# Patient Record
Sex: Female | Born: 1935 | Race: White | Hispanic: No | Smoking: Never smoker
Health system: Southern US, Community
[De-identification: ages and names within clinical notes are randomized; demographics above are authoritative.]

## PROBLEM LIST (undated history)

## (undated) DIAGNOSIS — E119 Type 2 diabetes mellitus without complications: Secondary | ICD-10-CM

## (undated) DIAGNOSIS — K219 Gastro-esophageal reflux disease without esophagitis: Secondary | ICD-10-CM

## (undated) DIAGNOSIS — F411 Generalized anxiety disorder: Secondary | ICD-10-CM

## (undated) DIAGNOSIS — I872 Venous insufficiency (chronic) (peripheral): Secondary | ICD-10-CM

## (undated) DIAGNOSIS — I10 Essential (primary) hypertension: Secondary | ICD-10-CM

## (undated) DIAGNOSIS — E7800 Pure hypercholesterolemia, unspecified: Secondary | ICD-10-CM

## (undated) DIAGNOSIS — I509 Heart failure, unspecified: Secondary | ICD-10-CM

## (undated) DIAGNOSIS — E559 Vitamin D deficiency, unspecified: Secondary | ICD-10-CM

## (undated) DIAGNOSIS — R269 Unspecified abnormalities of gait and mobility: Secondary | ICD-10-CM

## (undated) DIAGNOSIS — M1990 Unspecified osteoarthritis, unspecified site: Secondary | ICD-10-CM

## (undated) DIAGNOSIS — R911 Solitary pulmonary nodule: Secondary | ICD-10-CM

## (undated) DIAGNOSIS — Z87448 Personal history of other diseases of urinary system: Secondary | ICD-10-CM

## (undated) DIAGNOSIS — I4891 Unspecified atrial fibrillation: Secondary | ICD-10-CM

## (undated) DIAGNOSIS — H3530 Unspecified macular degeneration: Secondary | ICD-10-CM

## (undated) HISTORY — DX: Unspecified osteoarthritis, unspecified site: M19.90

## (undated) HISTORY — DX: Unspecified macular degeneration: H35.30

## (undated) HISTORY — DX: Personal history of other diseases of urinary system: Z87.448

## (undated) HISTORY — DX: Vitamin D deficiency, unspecified: E55.9

## (undated) HISTORY — DX: Type 2 diabetes mellitus without complications: E11.9

## (undated) HISTORY — PX: BLADDER SURGERY: SHX569

## (undated) HISTORY — DX: Gastro-esophageal reflux disease without esophagitis: K21.9

## (undated) HISTORY — DX: Venous insufficiency (chronic) (peripheral): I87.2

## (undated) HISTORY — PX: TOTAL ABDOMINAL HYSTERECTOMY: SHX209

## (undated) HISTORY — DX: Unspecified abnormalities of gait and mobility: R26.9

## (undated) HISTORY — DX: Heart failure, unspecified: I50.9

## (undated) HISTORY — DX: Essential (primary) hypertension: I10

## (undated) HISTORY — DX: Solitary pulmonary nodule: R91.1

## (undated) HISTORY — DX: Unspecified atrial fibrillation: I48.91

## (undated) HISTORY — DX: Pure hypercholesterolemia, unspecified: E78.00

## (undated) HISTORY — DX: Generalized anxiety disorder: F41.1

---

## 1988-12-05 HISTORY — PX: TOTAL KNEE ARTHROPLASTY: SHX125

## 1999-01-16 ENCOUNTER — Encounter: Payer: Self-pay | Admitting: Emergency Medicine

## 1999-01-16 ENCOUNTER — Inpatient Hospital Stay (HOSPITAL_COMMUNITY): Admission: EM | Admit: 1999-01-16 | Discharge: 1999-01-18 | Payer: Self-pay | Admitting: Emergency Medicine

## 1999-03-10 ENCOUNTER — Other Ambulatory Visit: Admission: RE | Admit: 1999-03-10 | Discharge: 1999-03-10 | Payer: Self-pay | Admitting: Internal Medicine

## 2004-11-08 ENCOUNTER — Ambulatory Visit: Payer: Self-pay | Admitting: Pulmonary Disease

## 2005-03-25 ENCOUNTER — Ambulatory Visit: Payer: Self-pay | Admitting: Pulmonary Disease

## 2005-03-29 ENCOUNTER — Ambulatory Visit: Payer: Self-pay | Admitting: *Deleted

## 2005-04-04 ENCOUNTER — Ambulatory Visit: Payer: Self-pay | Admitting: Cardiology

## 2005-04-11 ENCOUNTER — Ambulatory Visit: Payer: Self-pay | Admitting: *Deleted

## 2005-04-25 ENCOUNTER — Ambulatory Visit: Payer: Self-pay | Admitting: Internal Medicine

## 2005-05-03 ENCOUNTER — Ambulatory Visit: Payer: Self-pay | Admitting: Pulmonary Disease

## 2005-05-09 ENCOUNTER — Ambulatory Visit: Payer: Self-pay | Admitting: Cardiology

## 2005-05-23 ENCOUNTER — Ambulatory Visit: Payer: Self-pay

## 2005-05-23 ENCOUNTER — Ambulatory Visit: Payer: Self-pay | Admitting: Cardiology

## 2005-06-01 ENCOUNTER — Ambulatory Visit: Payer: Self-pay | Admitting: Cardiology

## 2005-06-09 ENCOUNTER — Ambulatory Visit: Payer: Self-pay | Admitting: Internal Medicine

## 2005-06-17 ENCOUNTER — Ambulatory Visit: Payer: Self-pay | Admitting: Cardiovascular Disease

## 2005-06-20 ENCOUNTER — Ambulatory Visit: Payer: Self-pay | Admitting: Pulmonary Disease

## 2005-07-04 ENCOUNTER — Ambulatory Visit: Admission: RE | Admit: 2005-07-04 | Discharge: 2005-07-04 | Payer: Self-pay | Admitting: Ophthalmology

## 2005-07-29 ENCOUNTER — Ambulatory Visit: Payer: Self-pay | Admitting: Internal Medicine

## 2005-08-13 ENCOUNTER — Emergency Department (HOSPITAL_COMMUNITY): Admission: EM | Admit: 2005-08-13 | Discharge: 2005-08-13 | Payer: Self-pay | Admitting: Emergency Medicine

## 2005-08-18 ENCOUNTER — Inpatient Hospital Stay (HOSPITAL_COMMUNITY): Admission: EM | Admit: 2005-08-18 | Discharge: 2005-08-21 | Payer: Self-pay | Admitting: Emergency Medicine

## 2005-08-19 ENCOUNTER — Ambulatory Visit: Payer: Self-pay | Admitting: Pulmonary Disease

## 2005-09-05 ENCOUNTER — Ambulatory Visit: Payer: Self-pay | Admitting: Pulmonary Disease

## 2005-09-20 ENCOUNTER — Ambulatory Visit: Payer: Self-pay | Admitting: Cardiology

## 2005-09-30 ENCOUNTER — Ambulatory Visit: Payer: Self-pay | Admitting: Internal Medicine

## 2005-10-12 ENCOUNTER — Ambulatory Visit: Payer: Self-pay | Admitting: Cardiology

## 2005-10-17 ENCOUNTER — Ambulatory Visit: Payer: Self-pay | Admitting: Pulmonary Disease

## 2005-10-19 ENCOUNTER — Ambulatory Visit: Payer: Self-pay | Admitting: Cardiology

## 2005-10-26 ENCOUNTER — Ambulatory Visit: Payer: Self-pay | Admitting: Cardiology

## 2005-11-02 ENCOUNTER — Ambulatory Visit: Payer: Self-pay

## 2005-11-16 ENCOUNTER — Ambulatory Visit: Payer: Self-pay | Admitting: Cardiology

## 2005-11-30 ENCOUNTER — Ambulatory Visit: Payer: Self-pay | Admitting: *Deleted

## 2005-12-13 ENCOUNTER — Ambulatory Visit: Payer: Self-pay | Admitting: Cardiology

## 2005-12-13 ENCOUNTER — Ambulatory Visit: Payer: Self-pay | Admitting: Pulmonary Disease

## 2005-12-21 ENCOUNTER — Ambulatory Visit: Payer: Self-pay | Admitting: Cardiology

## 2005-12-28 ENCOUNTER — Ambulatory Visit: Payer: Self-pay | Admitting: Cardiology

## 2006-01-04 ENCOUNTER — Ambulatory Visit (HOSPITAL_COMMUNITY): Admission: RE | Admit: 2006-01-04 | Discharge: 2006-01-04 | Payer: Self-pay | Admitting: Pulmonary Disease

## 2006-01-04 ENCOUNTER — Encounter (INDEPENDENT_AMBULATORY_CARE_PROVIDER_SITE_OTHER): Payer: Self-pay | Admitting: *Deleted

## 2006-01-11 ENCOUNTER — Ambulatory Visit: Payer: Self-pay | Admitting: Cardiology

## 2006-01-18 ENCOUNTER — Ambulatory Visit: Payer: Self-pay | Admitting: Internal Medicine

## 2006-02-01 ENCOUNTER — Ambulatory Visit: Payer: Self-pay | Admitting: Internal Medicine

## 2006-02-14 ENCOUNTER — Ambulatory Visit: Payer: Self-pay | Admitting: Pulmonary Disease

## 2006-02-15 ENCOUNTER — Ambulatory Visit: Payer: Self-pay | Admitting: Cardiology

## 2006-02-24 ENCOUNTER — Ambulatory Visit: Payer: Self-pay | Admitting: Cardiovascular Disease

## 2006-03-13 ENCOUNTER — Ambulatory Visit: Payer: Self-pay | Admitting: Cardiology

## 2006-04-03 ENCOUNTER — Ambulatory Visit: Payer: Self-pay | Admitting: Cardiology

## 2006-04-17 ENCOUNTER — Ambulatory Visit: Payer: Self-pay | Admitting: Cardiology

## 2006-05-04 ENCOUNTER — Ambulatory Visit: Payer: Self-pay | Admitting: Pulmonary Disease

## 2006-05-05 HISTORY — PX: CHOLECYSTECTOMY: SHX55

## 2006-05-30 ENCOUNTER — Inpatient Hospital Stay (HOSPITAL_COMMUNITY): Admission: EM | Admit: 2006-05-30 | Discharge: 2006-06-06 | Payer: Self-pay | Admitting: Emergency Medicine

## 2006-05-30 ENCOUNTER — Ambulatory Visit: Payer: Self-pay | Admitting: Pulmonary Disease

## 2006-06-02 ENCOUNTER — Encounter (INDEPENDENT_AMBULATORY_CARE_PROVIDER_SITE_OTHER): Payer: Self-pay | Admitting: Specialist

## 2006-06-13 ENCOUNTER — Ambulatory Visit: Payer: Self-pay | Admitting: Pulmonary Disease

## 2006-07-25 ENCOUNTER — Ambulatory Visit: Payer: Self-pay | Admitting: Pulmonary Disease

## 2006-08-25 ENCOUNTER — Inpatient Hospital Stay (HOSPITAL_COMMUNITY): Admission: EM | Admit: 2006-08-25 | Discharge: 2006-08-31 | Payer: Self-pay | Admitting: Emergency Medicine

## 2006-08-25 ENCOUNTER — Ambulatory Visit: Payer: Self-pay | Admitting: Pulmonary Disease

## 2006-09-27 ENCOUNTER — Ambulatory Visit: Payer: Self-pay | Admitting: Pulmonary Disease

## 2006-11-08 ENCOUNTER — Ambulatory Visit: Payer: Self-pay | Admitting: Pulmonary Disease

## 2007-02-12 ENCOUNTER — Ambulatory Visit: Payer: Self-pay | Admitting: Pulmonary Disease

## 2007-02-12 LAB — CONVERTED CEMR LAB
BUN: 21 mg/dL (ref 6–23)
Basophils Absolute: 0.1 10*3/uL (ref 0.0–0.1)
Basophils Relative: 1.5 % — ABNORMAL HIGH (ref 0.0–1.0)
Bilirubin, Direct: 0.2 mg/dL (ref 0.0–0.3)
Calcium: 9 mg/dL (ref 8.4–10.5)
Cholesterol: 184 mg/dL (ref 0–200)
Creatinine, Ser: 1.1 mg/dL (ref 0.4–1.2)
Direct LDL: 79.2 mg/dL
Eosinophils Absolute: 0.2 10*3/uL (ref 0.0–0.6)
Glucose, Bld: 149 mg/dL — ABNORMAL HIGH (ref 70–99)
HDL: 36.6 mg/dL — ABNORMAL LOW (ref >39.0)
Lymphocytes Relative: 34.2 % (ref 12.0–46.0)
Monocytes Absolute: 1 10*3/uL — ABNORMAL HIGH (ref 0.2–0.7)
Monocytes Relative: 15.6 % — ABNORMAL HIGH (ref 3.0–11.0)
Neutrophils Relative %: 46.1 % (ref 43.0–77.0)
Platelets: 164 10*3/uL (ref 150–400)
RDW: 12.5 % (ref 11.5–14.6)
WBC: 6.5 10*3/uL (ref 4.5–10.5)

## 2007-05-11 ENCOUNTER — Emergency Department (HOSPITAL_COMMUNITY): Admission: EM | Admit: 2007-05-11 | Discharge: 2007-05-11 | Payer: Self-pay | Admitting: Emergency Medicine

## 2007-06-14 ENCOUNTER — Ambulatory Visit: Payer: Self-pay | Admitting: Pulmonary Disease

## 2007-07-04 IMAGING — CT CT HEAD W/O CM
1 of 2 series · 13 of 30 positions shown, 17 images · IV contrast (agent unspecified)
Comparison: none

CLINICAL DATA: Dizziness, headache.
 CT OF THE HEAD WITHOUT CONTRAST:
TECHNIQUE: 5 mm collimated images were obtained from the base of the skull through the vertex according to standard protocol without contrast.

[Series 2: brain · axial · 0.47mm/px · z∈[+129,+258]mm · 13 of 32 slices shown, 17 images]
[im 3/32  brain]
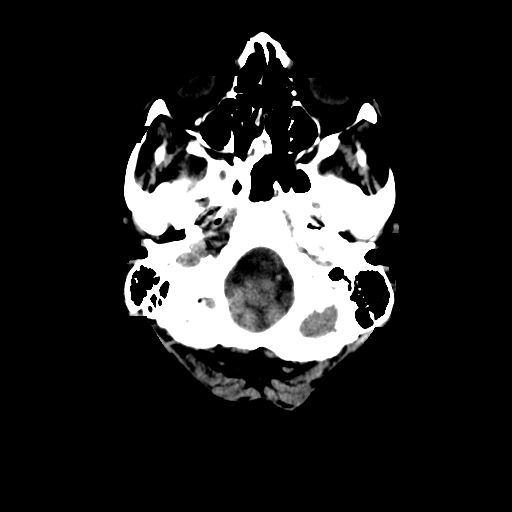
[im 3/32  bone]
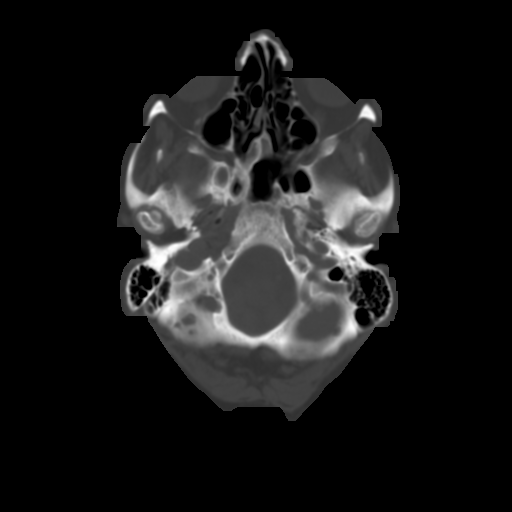
[im 5/32  brain]
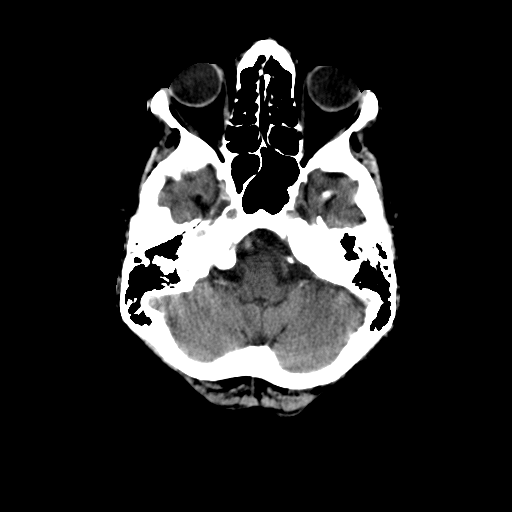
[im 7/32  brain]
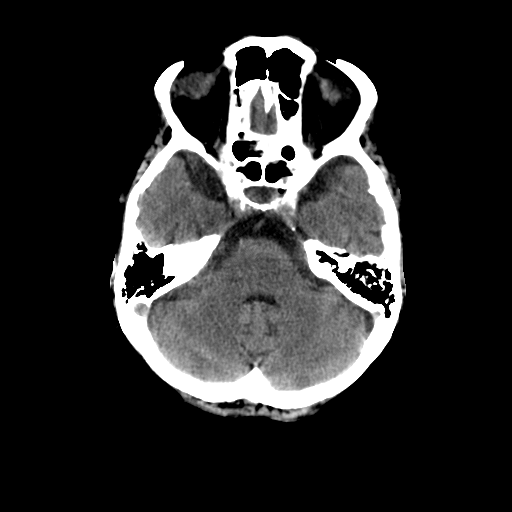
[im 9/32  brain]
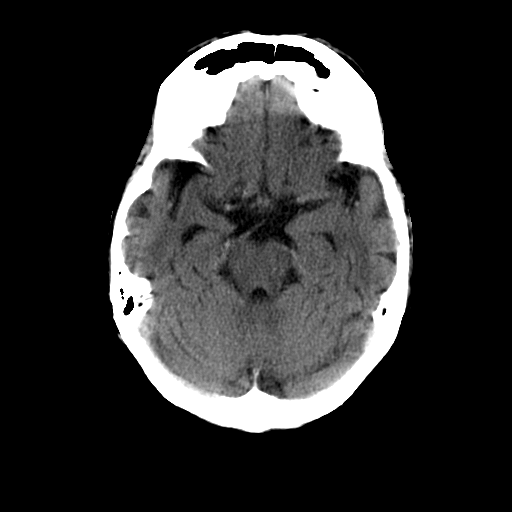
[im 12/32  brain]
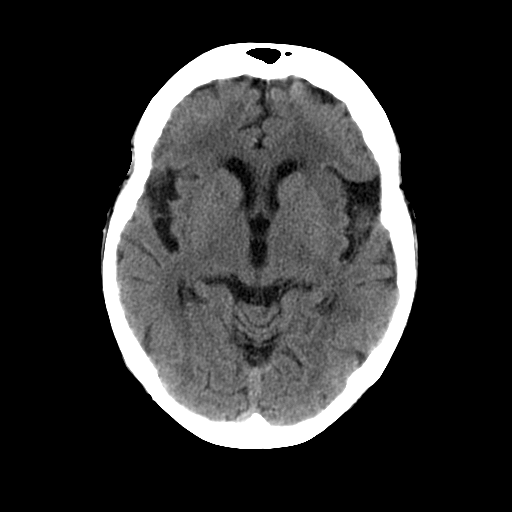
[im 12/32  bone]
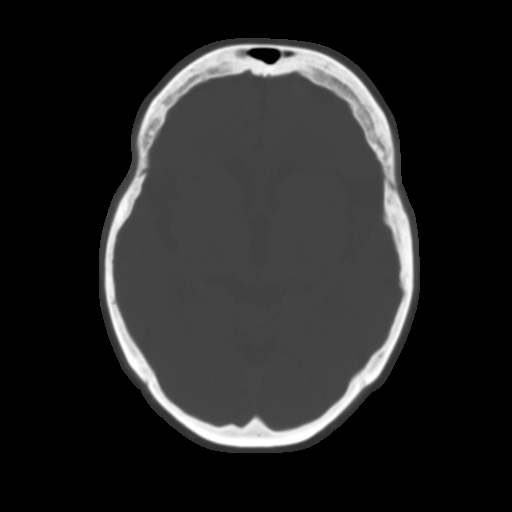
[im 14/32  brain]
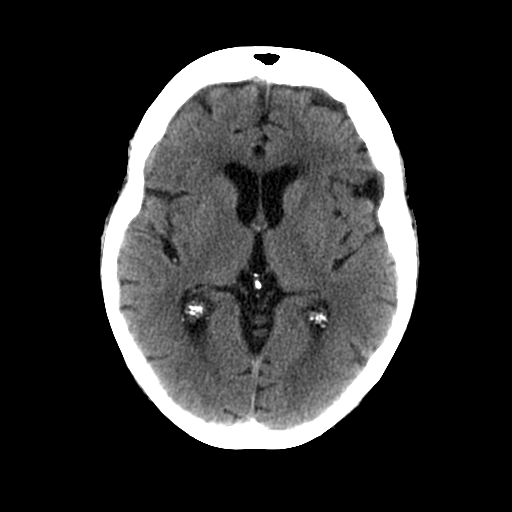
[im 16/32  brain]
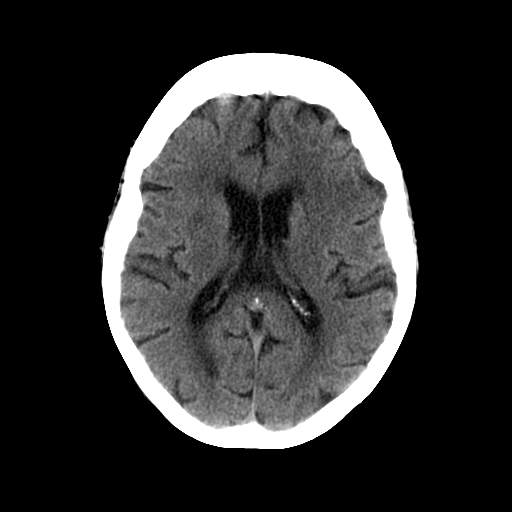
[im 18/32  brain]
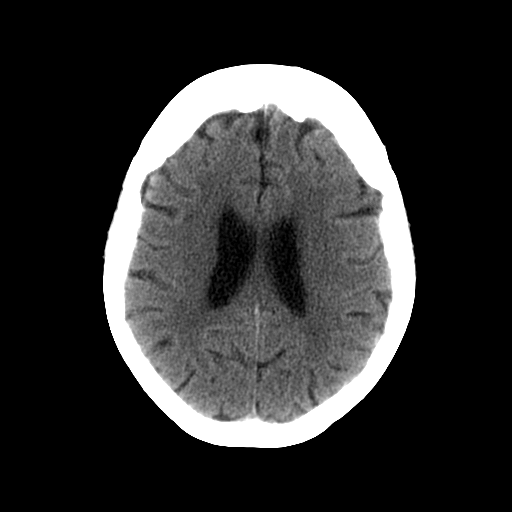
[im 20/32  brain]
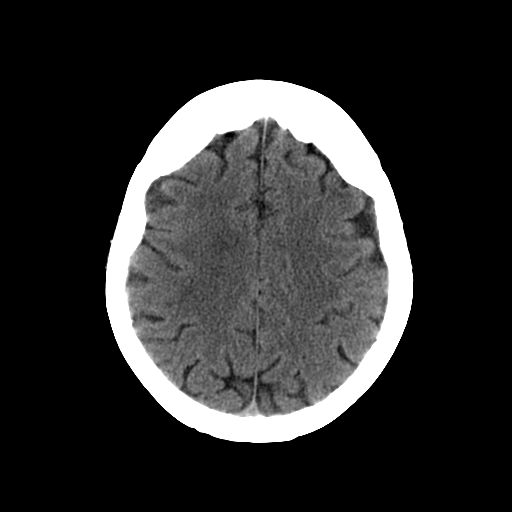
[im 20/32  bone]
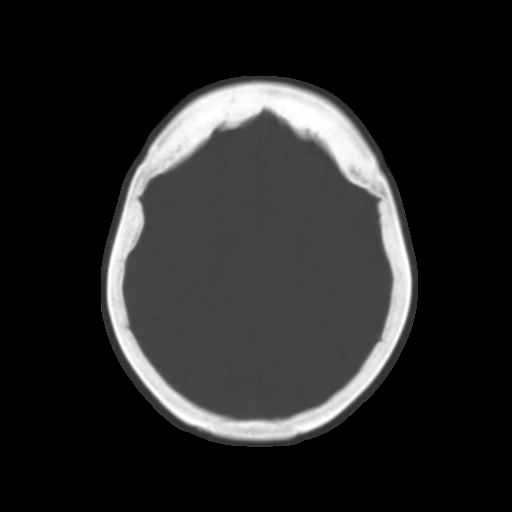
[im 23/32  brain]
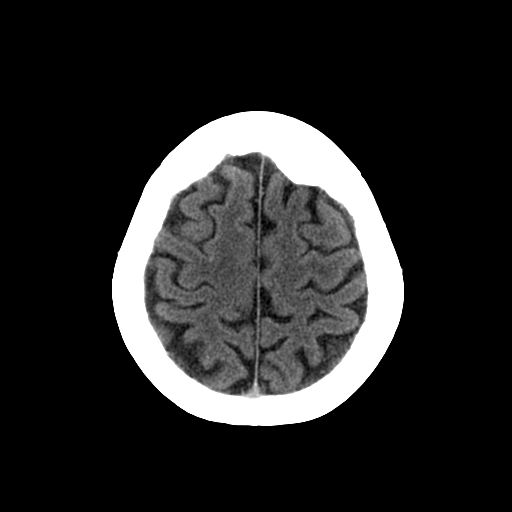
[im 25/32  brain]
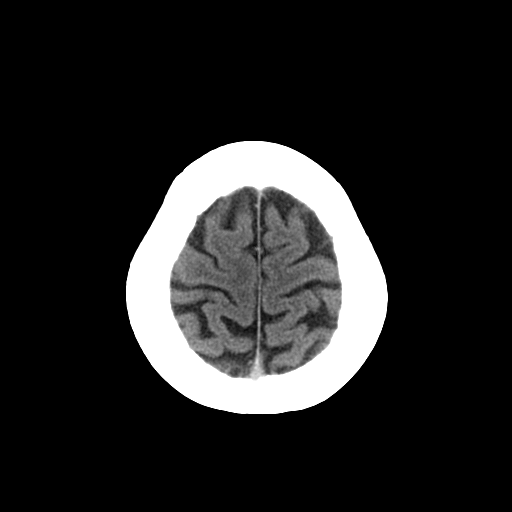
[im 27/32  brain]
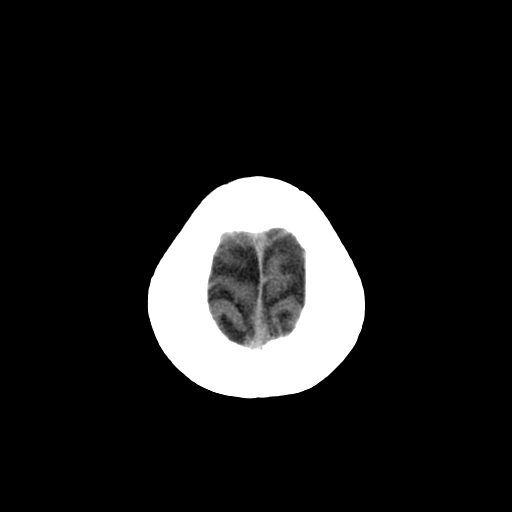
[im 29/32  brain]
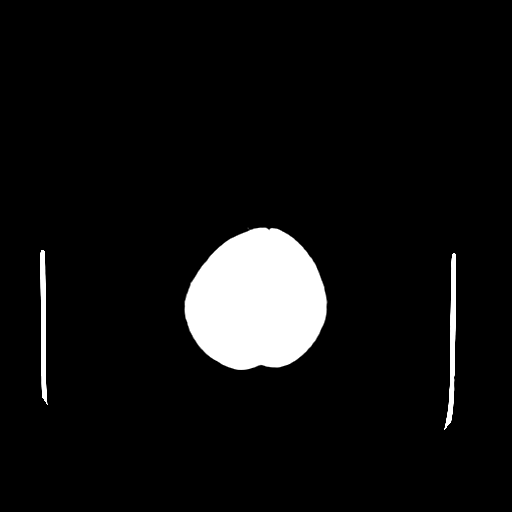
[im 29/32  bone]
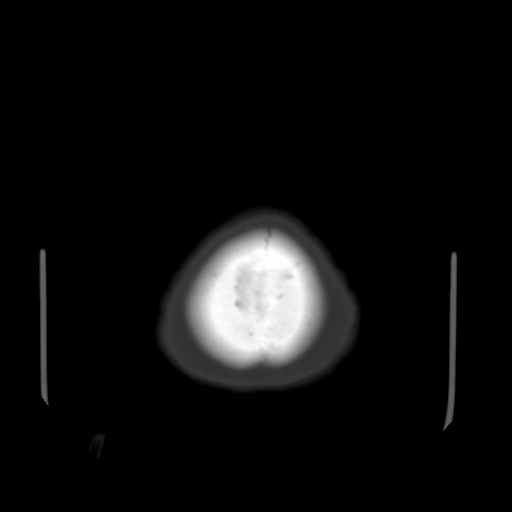

[13 of 30 positions shown; findings below may reference images not displayed]

FINDINGS: No comparison films available.  
 No acute intracranial abnormalities identified including mass or mass effect, hydrocephalus and extra-axial fluid collection, midline shift, hemorrhage, acute infarct.   Acute infarct may be missed by CT for 24-48 hours.  Decreased attenuation in the periventricular white matter is compatible with chronic ischemic changes of a microvascular nature.
IMPRESSION: No evidence of acute intracranial abnormality.

## 2007-10-10 DIAGNOSIS — Z87448 Personal history of other diseases of urinary system: Secondary | ICD-10-CM | POA: Insufficient documentation

## 2007-10-10 DIAGNOSIS — F411 Generalized anxiety disorder: Secondary | ICD-10-CM | POA: Insufficient documentation

## 2007-10-10 DIAGNOSIS — M1990 Unspecified osteoarthritis, unspecified site: Secondary | ICD-10-CM | POA: Insufficient documentation

## 2007-10-10 DIAGNOSIS — E7800 Pure hypercholesterolemia, unspecified: Secondary | ICD-10-CM | POA: Insufficient documentation

## 2007-10-10 DIAGNOSIS — K219 Gastro-esophageal reflux disease without esophagitis: Secondary | ICD-10-CM

## 2007-10-10 DIAGNOSIS — I10 Essential (primary) hypertension: Secondary | ICD-10-CM | POA: Insufficient documentation

## 2007-10-18 ENCOUNTER — Ambulatory Visit: Payer: Self-pay | Admitting: Pulmonary Disease

## 2007-10-18 LAB — CONVERTED CEMR LAB
ALT: 19 units/L (ref 0–35)
AST: 31 units/L (ref 0–37)
BUN: 15 mg/dL (ref 6–23)
CO2: 30 meq/L (ref 19–32)
Calcium: 9.2 mg/dL (ref 8.4–10.5)
Chloride: 99 meq/L (ref 96–112)
Creatinine, Ser: 1.2 mg/dL (ref 0.4–1.2)
Eosinophils Absolute: 0 10*3/uL (ref 0.0–0.6)
GFR calc non Af Amer: 44 mL/min
Glucose, Bld: 182 mg/dL — ABNORMAL HIGH (ref 70–99)
HCT: 43.5 % (ref 36.0–46.0)
Hemoglobin: 15.2 g/dL — ABNORMAL HIGH (ref 12.0–15.0)
LDL Cholesterol: 82 mg/dL (ref 0–99)
Lymphocytes Relative: 21.1 % (ref 12.0–46.0)
MCV: 92.4 fL (ref 78.0–100.0)
Monocytes Absolute: 1.6 10*3/uL — ABNORMAL HIGH (ref 0.2–0.7)
Monocytes Relative: 13.4 % — ABNORMAL HIGH (ref 3.0–11.0)
Neutro Abs: 8.1 10*3/uL — ABNORMAL HIGH (ref 1.4–7.7)
Neutrophils Relative %: 65 % (ref 43.0–77.0)
RBC: 4.71 M/uL (ref 3.87–5.11)
RDW: 12.9 % (ref 11.5–14.6)
TSH: 1.67 microintl units/mL (ref 0.35–5.50)
Total CHOL/HDL Ratio: 4

## 2007-11-23 ENCOUNTER — Emergency Department (HOSPITAL_COMMUNITY): Admission: EM | Admit: 2007-11-23 | Discharge: 2007-11-23 | Payer: Self-pay | Admitting: Family Medicine

## 2007-11-23 ENCOUNTER — Telehealth: Payer: Self-pay | Admitting: Pulmonary Disease

## 2007-12-12 ENCOUNTER — Ambulatory Visit: Payer: Self-pay | Admitting: Pulmonary Disease

## 2008-02-15 ENCOUNTER — Ambulatory Visit: Payer: Self-pay | Admitting: Pulmonary Disease

## 2008-02-15 DIAGNOSIS — I509 Heart failure, unspecified: Secondary | ICD-10-CM | POA: Insufficient documentation

## 2008-02-15 DIAGNOSIS — R269 Unspecified abnormalities of gait and mobility: Secondary | ICD-10-CM

## 2008-02-15 DIAGNOSIS — I4891 Unspecified atrial fibrillation: Secondary | ICD-10-CM

## 2008-02-15 DIAGNOSIS — I872 Venous insufficiency (chronic) (peripheral): Secondary | ICD-10-CM | POA: Insufficient documentation

## 2008-02-15 DIAGNOSIS — H3530 Unspecified macular degeneration: Secondary | ICD-10-CM | POA: Insufficient documentation

## 2008-02-15 DIAGNOSIS — E119 Type 2 diabetes mellitus without complications: Secondary | ICD-10-CM

## 2008-02-15 DIAGNOSIS — J984 Other disorders of lung: Secondary | ICD-10-CM

## 2008-02-16 LAB — CONVERTED CEMR LAB
AST: 26 units/L (ref 0–37)
Albumin: 3.5 g/dL (ref 3.5–5.2)
Alkaline Phosphatase: 91 units/L (ref 39–117)
Calcium: 9.3 mg/dL (ref 8.4–10.5)
Cholesterol: 206 mg/dL (ref 0–200)
Creatinine, Ser: 1.1 mg/dL (ref 0.4–1.2)
Eosinophils Absolute: 0.1 10*3/uL (ref 0.0–0.6)
HDL: 40.1 mg/dL (ref >39.0)
Hemoglobin: 14.3 g/dL (ref 12.0–15.0)
Lymphocytes Relative: 22.6 % (ref 12.0–46.0)
MCHC: 33.7 g/dL (ref 30.0–36.0)
MCV: 97.4 fL (ref 78.0–100.0)
Monocytes Absolute: 0.8 10*3/uL — ABNORMAL HIGH (ref 0.2–0.7)
Monocytes Relative: 11 % (ref 3.0–11.0)
Neutro Abs: 4.5 10*3/uL (ref 1.4–7.7)
Neutrophils Relative %: 64.1 % (ref 43.0–77.0)
Potassium: 3.9 meq/L (ref 3.5–5.1)
RBC: 4.35 M/uL (ref 3.87–5.11)
RDW: 12.5 % (ref 11.5–14.6)
TSH: 1.46 microintl units/mL (ref 0.35–5.50)
Total Bilirubin: 1.3 mg/dL — ABNORMAL HIGH (ref 0.3–1.2)
Total Protein: 7 g/dL (ref 6.0–8.3)
Triglycerides: 332 mg/dL (ref 0–149)
VLDL: 66 mg/dL — ABNORMAL HIGH (ref 0–40)

## 2008-03-25 ENCOUNTER — Telehealth: Payer: Self-pay | Admitting: Pulmonary Disease

## 2008-06-16 ENCOUNTER — Ambulatory Visit: Payer: Self-pay | Admitting: Pulmonary Disease

## 2008-06-16 LAB — CONVERTED CEMR LAB
CO2: 30 meq/L (ref 19–32)
Calcium: 9.3 mg/dL (ref 8.4–10.5)
Chloride: 106 meq/L (ref 96–112)
Creatinine, Ser: 1.1 mg/dL (ref 0.4–1.2)
Glucose, Bld: 138 mg/dL — ABNORMAL HIGH (ref 70–99)
Hgb A1c MFr Bld: 6.7 % — ABNORMAL HIGH (ref 4.6–6.0)
Potassium: 4.3 meq/L (ref 3.5–5.1)
Sodium: 145 meq/L (ref 135–145)

## 2008-07-18 LAB — CONVERTED CEMR LAB: Vit D, 1,25-Dihydroxy: 11 — ABNORMAL LOW (ref 30–89)

## 2008-10-17 ENCOUNTER — Ambulatory Visit: Payer: Self-pay | Admitting: Pulmonary Disease

## 2008-11-20 ENCOUNTER — Ambulatory Visit: Payer: Self-pay | Admitting: Pulmonary Disease

## 2009-02-20 ENCOUNTER — Ambulatory Visit: Payer: Self-pay | Admitting: Pulmonary Disease

## 2009-02-20 DIAGNOSIS — E559 Vitamin D deficiency, unspecified: Secondary | ICD-10-CM | POA: Insufficient documentation

## 2009-02-21 LAB — CONVERTED CEMR LAB
BUN: 20 mg/dL (ref 6–23)
Basophils Relative: 0.9 % (ref 0.0–3.0)
Bilirubin, Direct: 0.2 mg/dL (ref 0.0–0.3)
CO2: 31 meq/L (ref 19–32)
Chloride: 102 meq/L (ref 96–112)
Cholesterol: 172 mg/dL (ref 0–200)
Creatinine, Ser: 1 mg/dL (ref 0.4–1.2)
Direct LDL: 91.5 mg/dL
Eosinophils Absolute: 0.1 10*3/uL (ref 0.0–0.7)
Glucose, Bld: 139 mg/dL — ABNORMAL HIGH (ref 70–99)
Hgb A1c MFr Bld: 6.6 % — ABNORMAL HIGH (ref 4.6–6.5)
MCHC: 35.1 g/dL (ref 30.0–36.0)
MCV: 96.1 fL (ref 78.0–100.0)
Monocytes Absolute: 0.7 10*3/uL (ref 0.1–1.0)
Neutrophils Relative %: 59.2 % (ref 43.0–77.0)
Platelets: 165 10*3/uL (ref 150.0–400.0)
RBC: 4.51 M/uL (ref 3.87–5.11)
RDW: 12.1 % (ref 11.5–14.6)
Total Protein: 7.3 g/dL (ref 6.0–8.3)

## 2009-08-21 ENCOUNTER — Ambulatory Visit: Payer: Self-pay | Admitting: Pulmonary Disease

## 2009-08-22 LAB — CONVERTED CEMR LAB
BUN: 20 mg/dL (ref 6–23)
Creatinine, Ser: 1.2 mg/dL (ref 0.4–1.2)
GFR calc non Af Amer: 44.21 mL/min (ref >60)
Hgb A1c MFr Bld: 6.8 % — ABNORMAL HIGH (ref 4.6–6.5)
Potassium: 4.2 meq/L (ref 3.5–5.1)

## 2010-02-19 ENCOUNTER — Ambulatory Visit: Payer: Self-pay | Admitting: Pulmonary Disease

## 2010-02-20 LAB — CONVERTED CEMR LAB
ALT: 16 units/L (ref 0–35)
AST: 27 units/L (ref 0–37)
Albumin: 3.6 g/dL (ref 3.5–5.2)
BUN: 17 mg/dL (ref 6–23)
CO2: 31 meq/L (ref 19–32)
Chloride: 103 meq/L (ref 96–112)
Direct LDL: 82.9 mg/dL
Eosinophils Relative: 2.4 % (ref 0.0–5.0)
Glucose, Bld: 155 mg/dL — ABNORMAL HIGH (ref 70–99)
HDL: 45.8 mg/dL (ref >39.00)
Monocytes Absolute: 0.8 10*3/uL (ref 0.1–1.0)
Monocytes Relative: 15.1 % — ABNORMAL HIGH (ref 3.0–12.0)
Neutrophils Relative %: 49.6 % (ref 43.0–77.0)
Platelets: 135 10*3/uL — ABNORMAL LOW (ref 150.0–400.0)
Potassium: 4.2 meq/L (ref 3.5–5.1)
Pro B Natriuretic peptide (BNP): 189 pg/mL — ABNORMAL HIGH (ref 0.0–100.0)
TSH: 1.53 microintl units/mL (ref 0.35–5.50)
Total Protein: 7.3 g/dL (ref 6.0–8.3)
WBC: 5.3 10*3/uL (ref 4.5–10.5)

## 2010-02-25 ENCOUNTER — Encounter: Payer: Self-pay | Admitting: Pulmonary Disease

## 2010-08-04 ENCOUNTER — Emergency Department (HOSPITAL_COMMUNITY): Admission: EM | Admit: 2010-08-04 | Discharge: 2010-08-04 | Payer: Self-pay | Admitting: Licensed Clinical Social Worker

## 2010-08-04 ENCOUNTER — Telehealth: Payer: Self-pay | Admitting: Pulmonary Disease

## 2010-08-18 ENCOUNTER — Ambulatory Visit: Payer: Self-pay | Admitting: Pulmonary Disease

## 2010-08-23 LAB — CONVERTED CEMR LAB
Bilirubin Urine: NEGATIVE
Calcium: 9.4 mg/dL (ref 8.4–10.5)
Creatinine, Ser: 1.1 mg/dL (ref 0.4–1.2)
Direct LDL: 72.4 mg/dL
GFR calc non Af Amer: 47.29 mL/min (ref >60)
Glucose, Bld: 166 mg/dL — ABNORMAL HIGH (ref 70–99)
Microalb, Ur: 1.6 mg/dL (ref 0.0–1.9)
Nitrite: NEGATIVE
Sodium: 144 meq/L (ref 135–145)
Specific Gravity, Urine: 1.015 (ref 1.000–1.030)
Total Protein, Urine: NEGATIVE mg/dL
Triglycerides: 212 mg/dL — ABNORMAL HIGH (ref 0.0–149.0)
pH: 7 (ref 5.0–8.0)

## 2010-08-25 ENCOUNTER — Encounter: Payer: Self-pay | Admitting: Pulmonary Disease

## 2010-08-31 ENCOUNTER — Telehealth (INDEPENDENT_AMBULATORY_CARE_PROVIDER_SITE_OTHER): Payer: Self-pay | Admitting: *Deleted

## 2010-09-01 ENCOUNTER — Telehealth (INDEPENDENT_AMBULATORY_CARE_PROVIDER_SITE_OTHER): Payer: Self-pay | Admitting: *Deleted

## 2010-09-07 ENCOUNTER — Telehealth (INDEPENDENT_AMBULATORY_CARE_PROVIDER_SITE_OTHER): Payer: Self-pay | Admitting: *Deleted

## 2010-10-11 ENCOUNTER — Telehealth: Payer: Self-pay | Admitting: Pulmonary Disease

## 2010-10-11 ENCOUNTER — Ambulatory Visit: Payer: Self-pay | Admitting: Pulmonary Disease

## 2010-10-12 ENCOUNTER — Ambulatory Visit: Payer: Self-pay | Admitting: Internal Medicine

## 2010-10-18 ENCOUNTER — Telehealth: Payer: Self-pay | Admitting: Pulmonary Disease

## 2010-11-01 ENCOUNTER — Telehealth (INDEPENDENT_AMBULATORY_CARE_PROVIDER_SITE_OTHER): Payer: Self-pay | Admitting: *Deleted

## 2010-11-09 ENCOUNTER — Encounter: Payer: Self-pay | Admitting: Pulmonary Disease

## 2010-12-17 ENCOUNTER — Encounter: Payer: Self-pay | Admitting: Pulmonary Disease

## 2011-01-04 NOTE — Progress Notes (Signed)
Summary: pt fell at home, at [facility] ER - FYI  Phone Note Call from Patient   Caller: granddaughter sherril drye Call For: Carlea Badour Summary of Call: FYI: pt fell in bathroom this am. - hit her head- is at [facility]- er now having a CT. sherril drye cell [phone]. just wanted dr Kriste Basque to be aware.  Initial call taken by: Tivis Ringer, CNA,  August 04, 2010 11:04 AM  Follow-up for Phone Call        called spoke with Sherril to make her aware that if patient is admitted then SN will not be rounding on pt, triad hospitalist will be.  sherril verbalized her understanding and stated she will relay this info to her mother.  will forward message to SN as FYI. Boone Master CNA/MA  August 04, 2010 11:10 AM   Additional Follow-up for Phone Call Additional follow up Details #1::        SN is aware Randell Loop CMA  August 04, 2010 11:14 AM

## 2011-01-04 NOTE — Progress Notes (Signed)
Summary: order for wheelchair  Phone Note Call from Patient Call back at [phone] or [phone]   Caller: Daughter//wilma grubb Call For: nadel Summary of Call: Request an order for wheelchair for her mom, wants it written up so medicare will pay for it. Initial call taken by: Darletta Moll,  November 01, 2010 2:07 PM  Follow-up for Phone Call        daughter aware order sent to [facility] for wheelchair and i put in the order she wants this to go through [facility]-they supply dme equipment also Follow-up by: Philipp Deputy CMA,  November 01, 2010 3:46 PM

## 2011-01-04 NOTE — Assessment & Plan Note (Signed)
Summary: fall-hit head-dizziness/la   CC:  2 month ROV & add-on for fall....  History of Present Illness: 77 y/o WF here for an add-on visit due to a fall yest... she has mult medical problems as noted below...    ~  August 18, 2010:  she fell 2wks ago- hit occiput w/ hematoma on scalp> ER eval reviewed: lost balance; fell into bathtub; no LOC;  labs- OK, BS=180, +UTI given macrobid;  CT Brain= atrophy, chr microvasc dis, NAD;  CT CSpine= multilevel spondylosis & suboccipital hematoma in soft tissues w/o fx etc...  She had Ophthal f/u 3/11 DrRankin> senile macular degen OU, vitreous detach OS, bilat Drusen...  she received letter from [facility] DM & needs to be on ACE or ARB, therefore start Losartan 25mg /d...   ~  October 11, 2010:  add-on appt today after fall at home yest> she has been sl dizzy in the AMs & fell in the bathroom yest AM striking right occiput on commode- no blackout etc, didn't break skin, no hematoma but area bruised, min tender, neck OK, etc... she notes severe arthritis w/ pain & left knee gives way (right TKR in past)... she had recent infected callous tip of left 3rd hammer toe w/ drainage & antibiotic rx from DrSikora- much improved now (prob neuropathy & they are planning to get DM shoes)... BP appears well controlled on meds & no postural changes apparent, but sl higher in AMs & we discussed poss strategies w/ timing of pills etc... Plan- check CT Brain w/o contrast>    Current Problem List:  MACULAR DEGENERATION (ICD-362.50) - on OCUVITES daily... s/p laser surg by DrRankin... she cannot see well enough to read... they tried Bilberry Juice as heard on the [facility]- it's helping...  ~  3/10: had f/u w/ DrRankin- stabilized... nothing else he can do... f/u 77yr.  ~  3/11:  had f/u DrRankin> senile macular degen OU (worsening), vitreous detach OS (stable), bilat Drusen...    PULMONARY NODULE (ICD-518.89) - RUL nodule, eval 2007 w/ bx = granuloma...   ~  Serial CXRs  have been stable...  ~  CXR 3/11 showed chr changes & calcif granulomas, prob calcif of LAD seen on lat.  HYPERTENSION (ICD-401.9) - she takes ASA 81mg /d, ATENOLOL 25mg /d, LOSARTAN 25mg /d, & LASIX 40mg /d... BP=136/82, no postural changes,  and tol meds well... denies HA, CP, palpit, change in dyspnea, edema, etc...   ~  9/11:  she received letter from [facility] stating she should be on ACE or ARB Rx> add Losartan 25mg /d...  CONGESTIVE HEART FAILURE (ICD-428.0) - on above meds...  ~  2DEcho 6/06 showed biatrial enlargement, mild MR/TR, norm LVF...  ~  labs 3/11 showed BNP= 189...  ATRIAL FIBRILLATION, CHRONIC (ICD-427.31) - prev on Coumadin, pt & family decided to stop coumadin in 2007, & now on ASA daily... rate control strategy w/ Atenolol...  VENOUS INSUFFICIENCY (ICD-459.81) - on low sodium diet, and Lasix... no edema...  HYPERCHOLESTEROLEMIA (ICD-272.0) - on PRAVACHOL 40mg Qhs and tol well...  ~  FLP 11/08 showed TChol 151, TG 152, HDL 38, LDL 82... > continue med & diet efforts.  ~  FLP 3/09 shows TChol 206, TG 332, HDL 40, LDL 97... > cont same, get wt down!  ~  FLP 3/10 showed TChol 172, TG 214, HDL 43, LDL 92... > improved- continue same.  ~  FLP 3/11 showed TChol 167, TG 236, HDL 46, LDL 83  ~  FLP 9/11 showed TChol 144, TG 212, HDL 36,  LDL 72... reminded of low fat diet rec...  DM (ICD-250.00) - on METFORMIN ER 500mg Bid now...  ~  labs 11/08 showed FBS=182, HgA1c=7.1... >continue attempts at diet control.  ~  labs 3/09 showed  FBS=178, HgA1c=7.2... >she will need to start meds: Metformin ER 500mg Qam.  ~  labs 7/09 showed BS= 138, HgA1c= 6.7.Marland KitchenMarland Kitchenrec- keep same.   ~  labs 3/10 (wt=162#) showed 139, HgA1c= 6.6... > continue same meds.  ~  labs 9/10 (wt=175#) showed BS= 158, A1c= 6.8.Marland KitchenMarland Kitchen needs better diet.  ~  labs 3/11 (wt=179#) showed BS= 155, A1c= 7.6.Marland KitchenMarland Kitchen may need incr meds! get wt down.  ~  labs 9/11 (wt=173#) showed BS= 166, A1c= 8.0.Marland KitchenMarland Kitchen worsening A1c- rec incr Metform to 2/d...  ~   Urine microalb 9/11 = neg...  GERD (ICD-530.81) - on PRILOSEC 20mg /d, doing well without heartburn, n/v, etc... Hx HH, GERD, and prev DU... last EGD was 7/02 showing HH, reflux, stricture, gastric ulcer... she has refused colonoscopies in the past...  UTI'S, HX OF (ICD-V13.00) - hx UTI's, s/p hysterectomy, prev bladder surg...  ~  8/11:  ER visit after fall w/ UTI Rx'd w/ Macrobid...  DEGENERATIVE JOINT DISEASE (ICD-715.90)  - severe arthritis w/ prev right TKR 1990... followed by DrGioffre, DrBednarz, DrSikora (hammer toes)- not willing to consider more surg... uses Vicodin 2-3 per day as needed, shots not helpful in the past... leg discomfort may be neuropathy (but monofilament test is normal)- on Neurontin Rx taking 300mg - 2Qhs...  ~  she ambulates w/ walker, she has had phys therapy & does chair exercises, she has knee braces, etc...  VITAMIN D DEFICIENCY (ICD-268.9) - not currently taking any supplement.  ~  labs 7/09 showed Vit D level = 11... rec> start Vit D 50000 u weekly (but she stopped after 56mo).  ~  labs 3/10 showed Vit D level = 30... rec> change to 2000 u daily.  ABNORMALITY OF GAIT (ICD-781.2) & Frequent FALLS>   ~  9/11:  she fell 2wks ago- hit occiput w/ hematoma on scalp> ER eval reviewed: lost balance; fell into bathtub; no LOC;  labs- OK, BS=180, +UTI given macrobid;  CT Brain= atrophy, chr microvasc dis, NAD;  CT CSpine= multilevel spondylosis & suboccipital hematoma in soft tissues w/o fx etc...    ~  11/11:  she fell yest- hit right occiput on commode w/ bruise but no hematoma; didn't go to the ER; no focal neuro deficits & we discussed checking CT Brain to be sure no subdural etc...  ANXIETY (ICD-300.00  DERMATITIS - uses generic LIDEX E cream as needed...  Health Maintenance:  she gets the yearly Flu vaccine each Autumnt;  and she had PNEUMOVAX  ~2005 at [facility], she says.   Preventive Screening-Counseling & Management  Alcohol-Tobacco     Smoking Status:  never  Allergies: 1)  ! Voltaren 2)  ! Celebrex  Comments:  Nurse/Medical Assistant: The patient's medications and allergies were reviewed with the patient and were updated in the Medication and Allergy Lists.  Past History:  Past Medical History: MACULAR DEGENERATION (ICD-362.50) PULMONARY NODULE (ICD-518.89) HYPERTENSION (ICD-401.9) CONGESTIVE HEART FAILURE (ICD-428.0) ATRIAL FIBRILLATION, CHRONIC (ICD-427.31) VENOUS INSUFFICIENCY (ICD-459.81) HYPERCHOLESTEROLEMIA (ICD-272.0) DM (ICD-250.00) GERD (ICD-530.81) UTI'S, HX OF (ICD-V13.00) DEGENERATIVE JOINT DISEASE (ICD-715.90) VITAMIN D DEFICIENCY (ICD-268.9) ABNORMALITY OF GAIT (ICD-781.2) ANXIETY (ICD-300.00)  Past Surgical History: S/P cholecystectomy - 6/07 by Dorothe Pea S/P hysterectomy & bladder surgery S/P right TKR - 1990 by DrGioffre  Family History: Reviewed history and no changes required.  Social History: Reviewed  history and no changes required.  Review of Systems      See HPI       The patient complains of vision loss, decreased hearing, dyspnea on exertion, peripheral edema, muscle weakness, and difficulty walking.  The patient denies anorexia, fever, weight loss, weight gain, hoarseness, chest pain, syncope, prolonged cough, headaches, hemoptysis, abdominal pain, melena, hematochezia, severe indigestion/heartburn, hematuria, incontinence, suspicious skin lesions, transient blindness, depression, unusual weight change, abnormal bleeding, enlarged lymph nodes, and angioedema.    Vital Signs:  Patient profile:   77 year old female Height:      63 inches Weight:      171.38 pounds BMI:     30.47 O2 Sat:      94 % on Room air Temp:     97.2 degrees F oral Pulse rate:   74 / minute BP sitting:   136 / 82  (left arm) Cuff size:   large  Vitals Entered By: Randell Loop CMA (October 11, 2010 3:25 PM)  O2 Sat at Rest %:  94 O2 Flow:  Room air CC: 2 month ROV & add-on for fall... Is Patient Diabetic?  Yes Pain Assessment Patient in pain? yes      Onset of pain  pain in left knee--head pain from fall on sunday Comments no changes in meds today   Physical Exam  Additional Exam:  WD, WN, 77 y/o WF chr ill appearing but in NAD... GENERAL:  Alert & oriented; pleasant & cooperative... sm bruise right occiput, no hematoma. HEENT:  [place]/AT, EOM-wnl, poor vision from macular degen; EACs-clear, TMs-wnl, NOSE-clear, THROAT-clear w/ dry MM's. NECK:  Supple w/ fairROM; no JVD; normal carotid impulses w/o bruits; no thyromegaly or nodules palpated; no lymphadenopathy. CHEST:  Clear to P & A; without wheezes/ rales/ or rhonchi. HEART:  irreg, gr 1/6 SEM without rubs or gallops detected... ABDOMEN:  Soft & nontender; normal bowel sounds; no organomegaly or masses detected. EXT:  severe arthritic changes & rightTKR; no varicose veins/ +venous insuffic/ tr edema. NEURO:  CN's intact; +gait abn, no focal neuro deficits x sl decr sensation in LE's... DERM:  No lesions noted; no rash etc...    Impression & Recommendations:  Problem # 1:  FALL RESULTING IN STRIKING AGAINST OTHER OBJECT (ICD-E888.1) She lives w/ family & we discussed strategies to help prevent falls, esp in bathroom> needs seat (?rolling walker w/ seat), use knee brace, etc... we will check CT Brain to be sure no subdural etc... they request Meclizine to try for the dizziness & Rx provided... they will continue to monitor her BP at home & report to me...  Orders: Radiology Referral (Radiology) DME Referral (DME)  Problem # 2:  HYPERTENSION (ICD-401.9) BP controlled on meds, no apparent postural BP changes etc... Her updated medication list for this problem includes:    Atenolol 25 Mg Tabs (Atenolol) .Marland Kitchen... Take 1 tablet by mouth once a day    Losartan Potassium 25 Mg Tabs (Losartan potassium) .Marland Kitchen... Take 1 tab by mouth once daily...    Furosemide 40 Mg Tabs (Furosemide) .Marland Kitchen... Take 1 tablet by mouth once a day  Problem # 3:  ATRIAL  FIBRILLATION, CHRONIC (ICD-427.31) Stable rate control strategy... Her updated medication list for this problem includes:    Adult Aspirin Low Strength 81 Mg Tbdp (Aspirin) .Marland Kitchen... Take 1 tablet by mouth once a day    Atenolol 25 Mg Tabs (Atenolol) .Marland Kitchen... Take 1 tablet by mouth once a day  Problem # 4:  DM (ICD-250.00) BS at home have been improved w/ the Metformin at Bid (note- last creat OK at 1.1)... Her updated medication list for this problem includes:    Adult Aspirin Low Strength 81 Mg Tbdp (Aspirin) .Marland Kitchen... Take 1 tablet by mouth once a day    Losartan Potassium 25 Mg Tabs (Losartan potassium) .Marland Kitchen... Take 1 tab by mouth once daily...    Glucophage Xr 500 Mg Xr24h-tab (Metformin hcl) .Marland Kitchen... Take 1 tablet by mouth two times a day  Problem # 5:  DEGENERATIVE JOINT DISEASE (ICD-715.90) As noted>  severe DJD followed by DrGioffre et al... Her updated medication list for this problem includes:    Adult Aspirin Low Strength 81 Mg Tbdp (Aspirin) .Marland Kitchen... Take 1 tablet by mouth once a day    Hydrocodone-acetaminophen 5-500 Mg Tabs (Hydrocodone-acetaminophen) .Marland Kitchen... 1 tab by mouth every 4-6 hours as needed for pain... not to exceed 3/d.    Tylenol 325 Mg Tabs (Acetaminophen) .Marland Kitchen... As needed alternates this with the hydrocodone  Problem # 6:  OTHER MEDICAL PROBLEMS AS NOTED>>>  Complete Medication List: 1)  Ocuvite Tabs (Multiple vitamins-minerals) .... 2 tabs once daily 2)  Adult Aspirin Low Strength 81 Mg Tbdp (Aspirin) .... Take 1 tablet by mouth once a day 3)  Atenolol 25 Mg Tabs (Atenolol) .... Take 1 tablet by mouth once a day 4)  Losartan Potassium 25 Mg Tabs (Losartan potassium) .... Take 1 tab by mouth once daily.Marland KitchenMarland Kitchen 5)  Furosemide 40 Mg Tabs (Furosemide) .... Take 1 tablet by mouth once a day 6)  Pravachol 40 Mg Tabs (Pravastatin sodium) .... Take one tablet by mouth at bedtime 7)  Glucophage Xr 500 Mg Xr24h-tab (Metformin hcl) .... Take 1 tablet by mouth two times a day 8)  Prilosec 20 Mg  Cpdr (Omeprazole) .... Take 1 tablet by mouth once a day 9)  Hydrocodone-acetaminophen 5-500 Mg Tabs (Hydrocodone-acetaminophen) .Marland Kitchen.. 1 tab by mouth every 4-6 hours as needed for pain... not to exceed 3/d. 10)  Gabapentin 300 Mg Caps (Gabapentin) .... Take 2 caps by mouth at bedtime... 11)  Tylenol 325 Mg Tabs (Acetaminophen) .... As needed alternates this with the hydrocodone 12)  Vitamin D 2000 Unit Tabs (Cholecalciferol) .... Take 1 tablet by mouth once a day 13)  Lidex 0.05 % Crea (Fluocinonide) .... Apply to rash as needed... 14)  Meclizine Hcl 25 Mg Tabs (Meclizine hcl) .... Take 1/2 to 1 tab by mouth every 6 h as needed for dizziness...  Patient Instructions: 1)  Today we updated your med list- see below.... 2)  We discussed trying some Meclizine 1/2 to 1 tab every 6 H as needed for dizziness... 3)  Check your BP in the AM & see if you need to space out your meds differently as we discussed... 4)  We will arrange for a CT Scan & call you w/ the results when avail.Marland KitchenMarland Kitchen 5)  PLEASE PLEase please be careful> no falling allowed!!! 6)  Call for any questions... Prescriptions: MECLIZINE HCL 25 MG TABS (MECLIZINE HCL) take 1/2 to 1 tab by mouth every 6 H as needed for dizziness...  #50 x 6   Entered and Authorized by:   Michele Mcalpine MD   Signed by:   Michele Mcalpine MD on 10/11/2010   Method used:   Print then Give to Patient   RxID:   [id number]

## 2011-01-04 NOTE — Progress Notes (Signed)
Summary: rx for metformin  Phone Note Call from Patient Call back at Home Phone [phone]   Caller: daughter, Cheryl Flash Call For: Kriste Basque Summary of Call: Pt's daughter, Gigi Gin, calling stating she spoke with nurse last week or so about SN wanting to increase pt's Metformin to two times a day.  Peggy states she was told by nurse that someone would call in a new rx this week with the new directions to pt's pharmacy.  Nothing sent to pharmacy.  Needs this asap as pt only as a few days left of the Metformin before she will run out.   [employer].  Initial call taken by: Arman Filter LPN,  August 31, 2010 12:02 PM  Follow-up for Phone Call        Rx for metformin two times a day was sent to [facility] as well as a refill for gabapentin per daughter's request. Follow-up by: Vernie Murders,  August 31, 2010 1:07 PM    Prescriptions: GABAPENTIN 300 MG CAPS (GABAPENTIN) take 2 caps by mouth at bedtime...  #60 x 11   Entered by:   Vernie Murders   Authorized by:   Michele Mcalpine MD   Signed by:   Vernie Murders on 08/31/2010   Method used:   Electronically to        [facility]* (retail)       [address]       [place], [place]  [id number]       Ph: [phone]       Fax: [phone]   RxID:   [id number] FORTAMET 500 MG  TB24 (METFORMIN HCL) take 1 tab by mouth two times a day...  #60 x 11   Entered by:   Vernie Murders   Authorized by:   Michele Mcalpine MD   Signed by:   Vernie Murders on 08/31/2010   Method used:   Electronically to        [facility]* (retail)       [address]       [place], [place]  [id number]       Ph: [phone]       Fax: [phone]   RxID:   [id number]

## 2011-01-04 NOTE — Progress Notes (Signed)
Summary: pt hit head/ 77 yrs old  Phone Note Call from Patient   Caller: Daughter-WILMA GRUBB Call For: Ladon Vandenberghe Summary of Call: pt fell yesterday morning. hit the back of her head on the R side. pt insisted that she was fine. today daughter says pt complaining of pain in head- feels like "a rock hit it". (pt wasn't take to er- didn't have a scan). the bump is "slight- not big goose egg". it is slightly red. bp is 186 /88. pt also dizzy although daughter says pt is sometimes dizzy anyway. wants to know what dr Kriste Basque recs or can pt be worked in today? call wilma gruubb at [phone] Initial call taken by: Tivis Ringer, CNA,  October 11, 2010 9:05 AM  Follow-up for Phone Call        The daughter says her son who is an EMT checked the patient yesterday after she fell and that is why she was not taken to the ER at that time. The daughter would prefer that she not be taken to the ER because this is so traumatic for the patient. She is requesting to bring her to our office for an appt or to just get a scan of her head. The patient is having some increased dizziness and there is a slight knot on the back of her head. Please advise. Follow-up by: Michel Bickers CMA,  October 11, 2010 10:06 AM  Additional Follow-up for Phone Call Additional follow up Details #1::        please add pt on for today at 3:30 ---called and spoke with daughter and she is aware of appt today at 3:30 Randell Loop CMA  October 11, 2010 10:20 AM

## 2011-01-04 NOTE — Assessment & Plan Note (Signed)
Summary: rov 6 months///kp   CC:  6 month ROV & review of mult medical problems....  History of Present Illness: 77 y/o WF here for a follow up visit... she has mult medical problems as noted below...    ~  Nov09:  her biggest day-to-day prob is her arthritis- she has difficulty walking due to arthritis in her knees and pain in her feet... she is s/p right TKR in 1990... she has seen DrGioffre and DrBednarz (hammer toes) and she is not willing to undergo surgery... she uses DCN 100 and VICODIN for pain, she has a walker, shots were not helpful in the past... she also c/o nocturnal leg discomfort- ?neuropathic... we tried Neurontin and symptoms are sl improved- dose incr to 300mg  Qhs.  ~  Mar10:  generally stable, and still a remarkable woman for 77 y/o!!!  CC= her arthritis as before and she remains on homePT, heat, DCN/ Vicodin Prn... ambulates w/ walker and tries to get some activity daily, chair exercises, etc... notes sl cough, clears throat, dry mouth & we discussed incr fluids, Mucinex, Delsym, Lozenges...   ~  August 21, 2009:  she states "I feel good" but c/o severe arthritis & difficulty ambulating- using walker, lives w/ daugh full time now... notes insomnia due to leg/ feet discomfort at night (she has Vicodin, Neurontin)...   ~  February 19, 2010:  she's now 89!  Remarkably stable- no new complaints or concerns... she uses Vicodin for for leg pain & arthritis... due for CXR & fasting blood work>>    Current Problem List:  MACULAR DEGENERATION (ICD-362.50) - on OCUVITES daily... s/p laser surg by DrRankin... she cannot see well enough to read... they tried Bilberry Juice as heard on the [facility]- it's helping...  ~  3/10: had f/u w/ DrRankin- stabilized... nothing else he can do... f/u 45yr.  PULMONARY NODULE (ICD-518.89) - RUL nodule, eval 2007 w/ bx = granuloma...   ~  Serial CXRs have been stable...  ~  CXR 3/11 showed chr changes & calcif granulomas, prob calcif of LAD  seen on lat.  HYPERTENSION (ICD-401.9) - she takes ASA 81mg /d, ATENOLOL 25mg /d, & LASIX 40mg /d... BP=126/72 and she is tol her meds well... denies HA, visual changes, CP, palipit, syncope, change in dyspnea, edema, etc...  CONGESTIVE HEART FAILURE (ICD-428.0) - on above meds...  ~  2DEcho 6/06 showed biatrial enlargement, mild MR/TR, norm LVF...  ~  labs 3/11 showed BNP= 189...  ATRIAL FIBRILLATION, CHRONIC (ICD-427.31) - prev on Coumadin, pt & family decided to stop coumadin in 2007... rate control w/ Atenolol...  VENOUS INSUFFICIENCY (ICD-459.81) - on low sodium diet, and Lasix... no edema...  HYPERCHOLESTEROLEMIA (ICD-272.0) - on PRAVACHOL 40mg Qhs and tol well...  ~  FLP 11/08 showed TChol 151, TG 152, HDL 38, LDL 82... > continue med & diet efforts.  ~  FLP 3/09 shows TChol 206, TG 332, HDL 40, LDL 97... > cont same, get wt down!  ~  FLP 3/10 showed TChol 172, TG 214, HDL 43, LDL 92... > improved- continue same.  ~  FLP 3/11 showed TChol 167, TG 236, HDL 46, LDL 83  DM (ICD-250.00) - she started METFORMIN ER 500mg /d since Mar09.  ~  labs 11/08 showed FBS=182, HgA1c=7.1... >continue attempts at diet control.  ~  labs 3/09 showed  FBS=178, HgA1c=7.2... >she will need to start meds: METFORMIN ER 500mg Qam.  ~  labs 7/09 showed BS= 138, HgA1c= 6.7.Marland KitchenMarland Kitchenrec- keep same.   ~  labs 3/10 (wt=162#) showed  139, HgA1c= 6.6... > continue same meds.  ~  labs 9/10 (wt=175#) showed BS= 158, A1c= 6.8.Marland KitchenMarland Kitchen needs better diet.  ~  labs 3/11 (wt=179#) showed BS= 155, A1c= 7.6.Marland KitchenMarland Kitchen may need incr meds! get wt down.  GERD (ICD-530.81) - on PRILOSEC 20mg /d, doing well without heartburn, n/v, etc... Hx HH, GERD, and prev DU... last EGD was 7/02 showing HH, reflux, stricture, gastric ulcer... she has refused colonoscopies in the past...  UTI'S, HX OF (ICD-V13.00) - hx UTI's, s/p hysterectomy, prev bladder surg...  DEGENERATIVE JOINT DISEASE (ICD-715.90)  - severe arthritis w/ prev right TKR 1990... followed by [facility], not willing to consider more surg... uses Vicodin 2-3 per day as needed... leg discomfort may be neuropathy (but monofilament test is normal)- on Neurontin Rx taking 300mg Qhs...  VITAMIN D DEFICIENCY (ICD-268.9) - not currently taking any supplement.  ~  labs 7/09 showed Vit D level = 11... rec> start Vit D 50000 u weekly (but she stopped after 52mo).  ~  labs 3/10 showed Vit D level = 30... rec> change to 1000 u daily.  ABNORMALITY OF GAIT (ICD-781.2)  ANXIETY (ICD-300.00  DERMATITIS - uses generic LIDEX E cream as needed...  Health Maintenance:  she had the 2010 flu shot in Oct;  and she had PNEUMOVAX  ~11yrs ago at [facility], she says.   Allergies: 1)  ! Voltaren  Comments:  Nurse/Medical Assistant: The patient's medications and allergies were reviewed with the patient and were updated in the Medication and Allergy Lists.  Past History:  Past Medical History:  MACULAR DEGENERATION (ICD-362.50) PULMONARY NODULE (ICD-518.89) HYPERTENSION (ICD-401.9) CONGESTIVE HEART FAILURE (ICD-428.0) ATRIAL FIBRILLATION, CHRONIC (ICD-427.31) VENOUS INSUFFICIENCY (ICD-459.81) HYPERCHOLESTEROLEMIA (ICD-272.0) DM (ICD-250.00) GERD (ICD-530.81) UTI'S, HX OF (ICD-V13.00) DEGENERATIVE JOINT DISEASE (ICD-715.90) VITAMIN D DEFICIENCY (ICD-268.9) ABNORMALITY OF GAIT (ICD-781.2) ANXIETY (ICD-300.00)  Past Surgical History:  S/P cholecystectomy - 6/07 by Dorothe Pea S/P hysterectomy & bladder surgery S/P right TKR - 1990 by DrGioffre  Family History: Reviewed history and no changes required.  Social History: Reviewed history and no changes required.  Review of Systems      See HPI       The patient complains of weight gain, decreased hearing, dyspnea on exertion, muscle weakness, and difficulty walking.  The patient denies anorexia, fever, weight loss, vision loss, hoarseness, chest pain, syncope, peripheral edema, prolonged cough, headaches, hemoptysis, abdominal pain, melena,  hematochezia, severe indigestion/heartburn, hematuria, incontinence, suspicious skin lesions, transient blindness, depression, unusual weight change, abnormal bleeding, enlarged lymph nodes, and angioedema.    Vital Signs:  Patient profile:   77 year old female Height:      63 inches Weight:      178.38 pounds O2 Sat:      97 % on Room air Temp:     97.1 degrees F oral Pulse rate:   87 / minute BP sitting:   126 / 72  (left arm) Cuff size:   regular  Vitals Entered By: Randell Loop CMA (February 19, 2010 8:48 AM)  O2 Sat at Rest %:  97 O2 Flow:  Room air CC: 6 month ROV & review of mult medical problems... Is Patient Diabetic? No Pain Assessment Patient in pain? yes      Onset of pain  in her legs and knees Comments meds updated today   Physical Exam  Additional Exam:  WD, WN, 77 y/o WF chr ill appearing but in NAD... GENERAL:  Alert & oriented; pleasant & cooperative... HEENT:  [place]/AT, EOM-wnl, PERRLA, EACs-clear, TMs-wnl, NOSE-clear,  THROAT-clear w/ dry MM's. NECK:  Supple w/ fairROM; no JVD; normal carotid impulses w/o bruits; no thyromegaly or nodules palpated; no lymphadenopathy. CHEST:  Clear to P & A; without wheezes/ rales/ or rhonchi. HEART:  irreg, gr 1/6 SEM without rubs or gallops detected... ABDOMEN:  Soft & nontender; normal bowel sounds; no organomegaly or masses detected. EXT:  mod arthritic changes; no varicose veins/ +venous insuffic/ tr edema. NEURO:  CN's intact; +gait abn, no focal neuro deficits x sl decr sensation in LE's... DERM:  No lesions noted; no rash etc...    CXR  Procedure date:  02/19/2010  Findings:      CHEST - 2 VIEW Comparison: 02/20/2009   Findings: No acute chest findings.  Mild chronic markings at the bases.  Suspicion for calcification of the LAD on the lateral view. Cardiomegaly stable.  No vascular congestion.  Bony thorax intact. There may be calcified granuloma in the mediastinum and perihilar regions.    IMPRESSION: Suspicion for calcified LAD.  Cardiomegaly.  Probable old granulomatous disease.  No interval change.   Read By:  Jonne Ply,  M.D.   MISC. Report  Procedure date:  02/19/2010  Findings:      Lipid Panel (LIPID)   Cholesterol               167 mg/dL                   0-454   Triglycerides        [H]  236.0 mg/dL                 0.9-811.9   HDL                       14.78 mg/dL                 >29.56    Cholesterol LDL - Direct                             82.9 mg/dL           BMP (METABOL)   Sodium                    142 mEq/L                   135-145   Potassium                 4.2 mEq/L                   3.5-5.1   Chloride                  103 mEq/L                   96-112   Carbon Dioxide            31 mEq/L                    19-32   Glucose              [H]  155 mg/dL                   21-30   BUN                       17 mg/dL  6-23   Creatinine                1.1 mg/dL                   5.6-2.1   Calcium                   9.1 mg/dL                   3.0-86.5   GFR                       48.83 mL/min                >60  Hepatic/Liver Function Panel (HEPATIC)   Total Bilirubin           0.8 mg/dL                   7.8-4.6   Direct Bilirubin          0.2 mg/dL                   9.6-2.9   Alkaline Phosphatase      97 U/L                      39-117   AST                       27 U/L                      0-37   ALT                       16 U/L                      0-35   Total Protein             7.3 g/dL                    5.2-8.4   Albumin                   3.6 g/dL                    1.3-2.4  Comments:      CBC Platelet w/Diff (CBCD)   White Cell Count          5.3 K/uL                    4.5-10.5   Red Cell Count            4.46 Mil/uL                 3.87-5.11   Hemoglobin                14.3 g/dL                   40.1-02.7   Hematocrit                43.2 %                      36.0-46.0   MCV                       96.9  fl                      78.0-100.0   Platelet Count       [L]  135.0 K/uL                  150.0-400.0   Neutrophil %              49.6 %                      43.0-77.0   Lymphocyte %              32.6 %                      12.0-46.0   Monocyte %           [H]  15.1 %                      3.0-12.0   Eosinophils%              2.4 %                       0.0-5.0   Basophils %               0.3 %                       0.0-3.0   TSH (TSH)   FastTSH                   1.53 uIU/mL                 0.35-5.50  B-Type Natiuretic Peptide (BNPR)  B-Type Natriuetic Peptide                        [H]  189.0 pg/mL                 0.0-100.0  Hemoglobin A1C (A1C)   Hemoglobin A1C       [H]  7.6 %                       4.6-6.5   Impression & Recommendations:  Problem # 1:  PULMONARY NODULE (ICD-518.89) No change in granulomas... Orders: T-2 View CXR (71020TC)  Problem # 2:  HYPERTENSION (ICD-401.9) Controlled on meds-  continue same. Her updated medication list for this problem includes:    Atenolol 25 Mg Tabs (Atenolol) .Marland Kitchen... Take 1 tablet by mouth once a day    Furosemide 40 Mg Tabs (Furosemide) .Marland Kitchen... Take 1 tablet by mouth once a day  Orders: T-2 View CXR (71020TC) TLB-Lipid Panel (80061-LIPID) TLB-BMP (Basic Metabolic Panel-BMET) (80048-METABOL) TLB-Hepatic/Liver Function Pnl (80076-HEPATIC) TLB-CBC Platelet - w/Differential (85025-CBCD) TLB-TSH (Thyroid Stimulating Hormone) (84443-TSH) TLB-BNP (B-Natriuretic Peptide) (83880-BNPR) TLB-A1C / Hgb A1C (Glycohemoglobin) (83036-A1C)  Problem # 3:  CONGESTIVE HEART FAILURE (ICD-428.0) BNP is 189... no salt, elevate legs, continue Lasix... Her updated medication list for this problem includes:    Adult Aspirin Low Strength 81 Mg Tbdp (Aspirin) .Marland Kitchen... Take 1 tablet by mouth once a day    Atenolol 25 Mg Tabs (Atenolol) .Marland Kitchen... Take 1 tablet by mouth once a day    Furosemide 40 Mg Tabs (Furosemide) .Marland Kitchen... Take 1 tablet by mouth once a day  Problem #  4:  ATRIAL FIBRILLATION, CHRONIC (ICD-427.31) Stable on rate control strtegy... Her updated medication list for this problem includes:    Adult Aspirin Low Strength 81 Mg Tbdp (Aspirin) .Marland Kitchen... Take 1 tablet by mouth once a day    Atenolol 25 Mg Tabs (Atenolol) .Marland Kitchen... Take 1 tablet by mouth once a day  Problem # 5:  HYPERCHOLESTEROLEMIA (ICD-272.0) Lipids are reasonable on the PRAV40... to avoid fibrate addition- needs better diet. Her updated medication list for this problem includes:    Pravachol 40 Mg Tabs (Pravastatin sodium) .Marland Kitchen... Take one tablet by mouth at bedtime  Problem # 6:  DM (ICD-250.00) A1c is recently increased & needs better diet, get wt down vs addition of more meds... Her updated medication list for this problem includes:    Adult Aspirin Low Strength 81 Mg Tbdp (Aspirin) .Marland Kitchen... Take 1 tablet by mouth once a day    Fortamet 500 Mg Tb24 (Metformin hcl) .Marland Kitchen... 1 tab by mouth each morning...  Problem # 7:  DEGENERATIVE JOINT DISEASE (ICD-715.90) This is her CC-  continue vicodin etc... Her updated medication list for this problem includes:    Adult Aspirin Low Strength 81 Mg Tbdp (Aspirin) .Marland Kitchen... Take 1 tablet by mouth once a day    Hydrocodone-acetaminophen 5-500 Mg Tabs (Hydrocodone-acetaminophen) .Marland Kitchen... 1 tab by mouth every 4-6 hours as needed for pain... not to exceed 3/d.  Problem # 8:  OTHER MEDICAL PROBLEMS AS NOTED>>>  Complete Medication List: 1)  Ocuvite Tabs (Multiple vitamins-minerals) .... 2 tabs once daily 2)  Adult Aspirin Low Strength 81 Mg Tbdp (Aspirin) .... Take 1 tablet by mouth once a day 3)  Atenolol 25 Mg Tabs (Atenolol) .... Take 1 tablet by mouth once a day 4)  Furosemide 40 Mg Tabs (Furosemide) .... Take 1 tablet by mouth once a day 5)  Pravachol 40 Mg Tabs (Pravastatin sodium) .... Take one tablet by mouth at bedtime 6)  Fortamet 500 Mg Tb24 (Metformin hcl) .Marland Kitchen.. 1 tab by mouth each morning... 7)  Prilosec 20 Mg Cpdr (Omeprazole) .... Take 1 tablet  by mouth once a day 8)  Hydrocodone-acetaminophen 5-500 Mg Tabs (Hydrocodone-acetaminophen) .Marland Kitchen.. 1 tab by mouth every 4-6 hours as needed for pain... not to exceed 3/d. 9)  Gabapentin 300 Mg Caps (Gabapentin) .... Take 2 caps by mouth at bedtime... 10)  Lidex 0.05 % Crea (Fluocinonide) .... Apply to rash as needed... 11)  Vitamin D 2000 Unit Tabs (Cholecalciferol) .... Take 1 tablet by mouth once a day  Patient Instructions: 1)  Today we updated your med list- see below.... 2)  Continue your current medications the same... 3)  Today we did your follow up CXR & FASTING blood work... please call the "phone tree" in a few days for your lab results.Marland KitchenMarland Kitchen 4)  Stay as active as poss & be safe... 5)  Call for any problems.Marland KitchenMarland Kitchen 6)  Please schedule a follow-up appointment in 6 months.

## 2011-01-04 NOTE — Letter (Signed)
Summary: [facility]  [facility]   Imported By: Sherian Rein 03/03/2010 13:27:55  _____________________________________________________________________  External Attachment:    Type:   Image     Comment:   External Document

## 2011-01-04 NOTE — Miscellaneous (Signed)
Summary: Fluvirin Vaccine / [facility]  Fluvirin Vaccine / [facility]   Imported By: Lennie Odor 09/01/2010 14:16:46  _____________________________________________________________________  External Attachment:    Type:   Image     Comment:   External Document

## 2011-01-04 NOTE — Progress Notes (Signed)
Summary: UTI / BP   Phone Note Call from Patient   Caller: Daughter-WILMA GRUBB Call For: NADEL Summary of Call: PT HAS A UTI X 1 WK- VERY STRONG ODOR TODAY. ALSO BP HAS BEEN UP AND DOWN. REQUESTS TO SPEAK TO SOMEONE ASAP AS CALLER HAS TO GO TO A [facility]ER[facility] SOON. CALL [phone]  [place] Initial call taken by: Tivis Ringer, CNA,  October 18, 2010 10:04 AM  Follow-up for Phone Call        Spoke with pt daughter and she states the last time the pt had a UTI the only symptm she had was a strng odor in urine and was given an abx and the odor went away. She states the pt has the same odor again x 1 week. Daughter is requesting abx for this. She states they are unable to bring the pt in for an OV or sample.  Please advise.   Also pt daughter staets the pt BP has been elevated x 3 days. She staets on 10-15-10 it was 174/97, yesterday it was 196/103, but both of these readings were before pt took medication for that day. This morning after she took her BP meds her BP was 130/79. She states pt has been c/o having a headache over last few days. Please advise. Carron Curie CMA  October 18, 2010 11:14 AM   Additional Follow-up for Phone Call Additional follow up Details #1::        per SN---cipro 250mg   #14   1 by mouth two times a day and lots of fluids and cranberry juice.  thanks Randell Loop CMA  October 18, 2010 2:13 PM   Rx sent to [facility].  LMOMTCB to inform of above recs per SN Gweneth Dimitri RN  October 18, 2010 2:17 PM     Additional Follow-up for Phone Call Additional follow up Details #2::    pt daughter advised.Carron Curie CMA  October 18, 2010 4:55 PM   New/Updated Medications: CIPRO 250 MG TABS (CIPROFLOXACIN HCL) Take 1 tablet by mouth two times a day Prescriptions: CIPRO 250 MG TABS (CIPROFLOXACIN HCL) Take 1 tablet by mouth two times a day  #14 x 0   Entered by:   Gweneth Dimitri RN   Authorized by:   Michele Mcalpine MD   Signed by:   Gweneth Dimitri RN on  10/18/2010   Method used:   Electronically to        [facility]* (retail)       [address]       [place], [place]  [id number]       Ph: [phone]       Fax: [phone]   RxID:   [id number]

## 2011-01-04 NOTE — Progress Notes (Signed)
Summary: prescription question  Phone Note From Pharmacy Call back at [phone]   Caller: [facility]*//kim Call For: nadel  Summary of Call: Pharmacist states she can't substitute fortamet for glucophage hr, wants to know if drug should have been glucophage hr instead of fortamet, pls advise. Initial call taken by: Darletta Moll,  September 07, 2010 3:50 PM  Follow-up for Phone Call        called spoke with Selena Batten who states that per their system she cannot substitute the fortamet er with glucophage er and requests a new rx for the glucophage er 500mg  1 by mouth two times a day.  gave the verbal okay for this and changed on pt's med list. Boone Master CNA/MA  September 07, 2010 4:44 PM     New/Updated Medications: GLUCOPHAGE XR 500 MG XR24H-TAB (METFORMIN HCL) Take 1 tablet by mouth two times a day Prescriptions: GLUCOPHAGE XR 500 MG XR24H-TAB (METFORMIN HCL) Take 1 tablet by mouth two times a day  #60 x 11   Entered by:   Boone Master CNA/MA   Authorized by:   Michele Mcalpine MD   Signed by:   Boone Master CNA/MA on 09/07/2010   Method used:   Telephoned to ...       [facility]* (retail)       [address]       [place], [place]  [id number]       Ph: [phone]       Fax: [phone]   RxID:   [id number]

## 2011-01-04 NOTE — Progress Notes (Signed)
Summary: metformin question  Phone Note Call from Patient   Caller: pt's daughter [name] Call For: nadel Summary of Call: caller wants clarification re: metformin. she doesn't understand that metformin and fortamet are the same. is questioning rx. [phone] - [facility] Initial call taken by: Tivis Ringer, CNA,  September 01, 2010 11:06 AM  Follow-up for Phone Call        Called, spoke with pt's daughter, [name].  She had concerns bc metformin was supposed to be called in yesterday however fortamet was sent in instead.  I expained to Jacqueline that fortamet was sent in which is the brand name for metformin.  The drug store can give the pt the generic metformin instead of foramet as long as the rx doesn't have that brand name is medically necesary.   She verbalized understanding of this and voiced no further questions.    Follow-up by: Gweneth Dimitri RN,  September 01, 2010 11:39 AM

## 2011-01-04 NOTE — Assessment & Plan Note (Signed)
Summary: rov 6 months///kp   CC:  6 month ROV & review of mult medical problems....  History of Present Illness: 77 y/o WF here for a follow up visit... she has mult medical problems as noted below...    ~  Nov09:  her biggest day-to-day prob is her arthritis- she has difficulty walking due to arthritis in her knees and pain in her feet... she is s/p right TKR in 1990... she has seen DrGioffre and DrBednarz (hammer toes) and she is not willing to undergo surgery... she uses DCN 100 and VICODIN for pain, she has a walker, shots were not helpful in the past... she also c/o nocturnal leg discomfort- ?neuropathic... we tried Neurontin and symptoms are sl improved- dose incr to 300mg  Qhs.   ~  Mar10:  generally stable, and still a remarkable woman for 77 y/o!!!  CC= her arthritis as before and she remains on homePT, heat, DCN/ Vicodin Prn... ambulates w/ walker and tries to get some activity daily, chair exercises, etc... notes sl cough, clears throat, dry mouth & we discussed incr fluids, Mucinex, Delsym, Lozenges...  ~  Sep10:  she states "I feel good" but c/o severe arthritis & difficulty ambulating- using walker, lives w/ daugh full time now... notes insomnia due to leg/ feet discomfort at night (she has Vicodin, Neurontin)...   ~  February 19, 2010:  she's now 77!  Remarkably stable- no new complaints or concerns... she uses Vicodin for for leg pain & arthritis... due for CXR (cardiomeg, NAD) & fasting blood work (see below)>>   ~  August 18, 2010:  she fell 2wks ago- hit occiput w/ hematoma on scalp> ER eval reviewed: lost balance; fell into bathtub; no LOC;  labs- OK, BS=180, +UTI given macrobid;  CT Brain= atrophy, chr microvasc dis, NAD;  CT CSpine= multilevel spondylosis & suboccipital hematoma in soft tissues w/o fx etc...  She had Ophthal f/u 3/11 DrRankin> senile macular degen OU, vitreous detach OS, bilat Drusen...  she received letter from [facility] DM & needs to be on ACE or ARB, therefore  start Losartan 25mg /d...   Current Problem List:  MACULAR DEGENERATION (ICD-362.50) - on OCUVITES daily... s/p laser surg by DrRankin... she cannot see well enough to read... they tried Bilberry Juice as heard on the [facility]- it's helping...  ~  3/10: had f/u w/ DrRankin- stabilized... nothing else he can do... f/u 50yr.  ~  3/11:  had f/u DrRankin> senile macular degen OU (worsening), vitreous detach OS (stable), bilat Drusen...    PULMONARY NODULE (ICD-518.89) - RUL nodule, eval 2007 w/ bx = granuloma...   ~  Serial CXRs have been stable...  ~  CXR 3/11 showed chr changes & calcif granulomas, prob calcif of LAD seen on lat.  HYPERTENSION (ICD-401.9) - she takes ASA 81mg /d, ATENOLOL 25mg /d, & LASIX 40mg /d... BP=132/78 and tol meds well... denies HA, visual changes, CP, palipit, syncope, change in dyspnea, edema, etc... she received letter from [facility] stating she should be on ACE or ARB Rx> add LOSARTAN 25mg /d...  CONGESTIVE HEART FAILURE (ICD-428.0) - on above meds...  ~  2DEcho 6/06 showed biatrial enlargement, mild MR/TR, norm LVF...  ~  labs 3/11 showed BNP= 189...  ATRIAL FIBRILLATION, CHRONIC (ICD-427.31) - prev on Coumadin, pt & family decided to stop coumadin in 2007... rate control w/ Atenolol...  VENOUS INSUFFICIENCY (ICD-459.81) - on low sodium diet, and Lasix... no edema...  HYPERCHOLESTEROLEMIA (ICD-272.0) - on PRAVACHOL 40mg Qhs and tol well...  ~  FLP 11/08 showed  TChol 151, TG 152, HDL 38, LDL 82... > continue med & diet efforts.  ~  FLP 3/09 shows TChol 206, TG 332, HDL 40, LDL 97... > cont same, get wt down!  ~  FLP 3/10 showed TChol 172, TG 214, HDL 43, LDL 92... > improved- continue same.  ~  FLP 3/11 showed TChol 167, TG 236, HDL 46, LDL 83  ~  FLP 9/11 showed TChol 144, TG 212, HDL 36, LDL 72... reminded of low fat diet rec...  DM (ICD-250.00) - she started METFORMIN ER 500mg /d since Mar09.  ~  labs 11/08 showed FBS=182, HgA1c=7.1... >continue attempts at  diet control.  ~  labs 3/09 showed  FBS=178, HgA1c=7.2... >she will need to start meds: METFORMIN ER 500mg Qam.  ~  labs 7/09 showed BS= 138, HgA1c= 6.7.Marland KitchenMarland Kitchenrec- keep same.   ~  labs 3/10 (wt=162#) showed 139, HgA1c= 6.6... > continue same meds.  ~  labs 9/10 (wt=175#) showed BS= 158, A1c= 6.8.Marland KitchenMarland Kitchen needs better diet.  ~  labs 3/11 (wt=179#) showed BS= 155, A1c= 7.6.Marland KitchenMarland Kitchen may need incr meds! get wt down.  ~  labs 9/11 (wt=173#) showed BS= 166, A1c= 8.0.Marland KitchenMarland Kitchen worsening A1c- rec incr Metform to 2/d...  ~  Urine microalb 9/11 = neg...  GERD (ICD-530.81) - on PRILOSEC 20mg /d, doing well without heartburn, n/v, etc... Hx HH, GERD, and prev DU... last EGD was 7/02 showing HH, reflux, stricture, gastric ulcer... she has refused colonoscopies in the past...  UTI'S, HX OF (ICD-V13.00) - hx UTI's, s/p hysterectomy, prev bladder surg...  ~  8/11:  ER visit after fall w/ UTI Rx'd w/ Macrobid...  DEGENERATIVE JOINT DISEASE (ICD-715.90)  - severe arthritis w/ prev right TKR 1990... followed by [facility], not willing to consider more surg... uses Vicodin 2-3 per day as needed... leg discomfort may be neuropathy (but monofilament test is normal)- on Neurontin Rx taking 300mg Qhs...  VITAMIN D DEFICIENCY (ICD-268.9) - not currently taking any supplement.  ~  labs 7/09 showed Vit D level = 11... rec> start Vit D 50000 u weekly (but she stopped after 36mo).  ~  labs 3/10 showed Vit D level = 30... rec> change to 2000 u daily.  ABNORMALITY OF GAIT (ICD-781.2)  ANXIETY (ICD-300.00  DERMATITIS - uses generic LIDEX E cream as needed...  Health Maintenance:  she had the 2010 flu shot in Oct;  and she had PNEUMOVAX  ~24yrs ago at [place], she says.   Preventive Screening-Counseling & Management  Alcohol-Tobacco     Smoking Status: never  Allergies: 1)  ! Voltaren 2)  ! Celebrex  Comments:  Nurse/Medical Assistant: The patient's medications and allergies were reviewed with the patient and were updated in the  Medication and Allergy Lists.  Past History:  Past Medical History: MACULAR DEGENERATION (ICD-362.50) PULMONARY NODULE (ICD-518.89) HYPERTENSION (ICD-401.9) CONGESTIVE HEART FAILURE (ICD-428.0) ATRIAL FIBRILLATION, CHRONIC (ICD-427.31) VENOUS INSUFFICIENCY (ICD-459.81) HYPERCHOLESTEROLEMIA (ICD-272.0) DM (ICD-250.00) GERD (ICD-530.81) UTI'S, HX OF (ICD-V13.00) DEGENERATIVE JOINT DISEASE (ICD-715.90) VITAMIN D DEFICIENCY (ICD-268.9) ABNORMALITY OF GAIT (ICD-781.2) ANXIETY (ICD-300.00)  Past Surgical History: S/P cholecystectomy - 6/07 by Dorothe Pea S/P hysterectomy & bladder surgery S/P right TKR - 1990 by DrGioffre  Family History: Reviewed history and no changes required.  Social History: Reviewed history and no changes required.  Review of Systems       The patient complains of decreased hearing, dyspnea on exertion, and difficulty walking.  The patient denies anorexia, fever, weight loss, weight gain, vision loss, hoarseness, chest pain, syncope, peripheral edema, prolonged cough, headaches, hemoptysis,  abdominal pain, melena, hematochezia, severe indigestion/heartburn, hematuria, incontinence, muscle weakness, suspicious skin lesions, transient blindness, depression, unusual weight change, abnormal bleeding, enlarged lymph nodes, and angioedema.    Vital Signs:  Patient profile:   77 year old female Height:      63 inches Weight:      173.25 pounds BMI:     30.80 O2 Sat:      97 % on Room air Temp:     96.4 degrees F oral Pulse rate:   80 / minute BP sitting:   132 / 78  (left arm) Cuff size:   regular  Vitals Entered By: Randell Loop CMA (August 18, 2010 9:15 AM)  O2 Sat at Rest %:  97 O2 Flow:  Room air CC: 6 month ROV & review of mult medical problems... Is Patient Diabetic? Yes Pain Assessment Patient in pain? yes      Onset of pain  some right hip pain Comments meds updated today with pt   Physical Exam  Additional Exam:  WD, WN, 77 y/o WF chr  ill appearing but in NAD... GENERAL:  Alert & oriented; pleasant & cooperative... sm hematoma on occiput. HEENT:  [place]/AT, EOM-wnl, PERRLA, EACs-clear, TMs-wnl, NOSE-clear, THROAT-clear w/ dry MM's. NECK:  Supple w/ fairROM; no JVD; normal carotid impulses w/o bruits; no thyromegaly or nodules palpated; no lymphadenopathy. CHEST:  Clear to P & A; without wheezes/ rales/ or rhonchi. HEART:  irreg, gr 1/6 SEM without rubs or gallops detected... ABDOMEN:  Soft & nontender; normal bowel sounds; no organomegaly or masses detected. EXT:  mod arthritic changes; no varicose veins/ +venous insuffic/ tr edema. NEURO:  CN's intact; +gait abn, no focal neuro deficits x sl decr sensation in LE's... DERM:  No lesions noted; no rash etc...    MISC. Report  Procedure date:  08/18/2010  Findings:      BMP (METABOL)   Sodium                    144 mEq/L                   135-145   Potassium            [H]  5.7 mEq/L                   3.5-5.1   Chloride                  104 mEq/L                   96-112   Carbon Dioxide            31 mEq/L                    19-32   Glucose              [H]  166 mg/dL                   38-75   BUN                       21 mg/dL                    6-43   Creatinine                1.1 mg/dL  0.4-1.2   Calcium                   9.4 mg/dL                   6.6-44.0   GFR                       47.29 mL/min                >60  Hemoglobin A1C (A1C)   Hemoglobin A1C       [H]  8.0 %                       4.6-6.5   Lipid Panel (LIPID)   Cholesterol               144 mg/dL                   3-474   Triglycerides        [H]  212.0 mg/dL                 2.5-956.3   HDL                  [L]  87.56 mg/dL                 >43.32          Cholesterol LDL - Direct                             72.4 mg/dL  UDip w/Micro (URINE)   Color                     LT. YELLOW   Clarity                   Sl Cloudy                   Clear   Specific Gravity          1.015                        1.000 - 1.030   Urine Ph                  7.0                         5.0-8.0   Protein                   NEGATIVE    Urine Glucose             NEGATIVE                    Negative   Ketones                   NEGATIVE                    Negative   Urine Bilirubin           NEGATIVE                    Negative   Blood  NEGATIVE                    Negative   Urobilinogen              0.2                         0.0 - 1.0   Leukocyte Esterace        NEGATIVE                    Negative   Nitrite                   NEGATIVE                    Negative   Urine WBC                 0-2/hpf                     0-2/hpf   Urine Epith               Many(>10/hpf)               Rare(0-4/hpf)   Urine Bacteria            Many(>50/hpf)   Impression & Recommendations:  Problem # 1:  MACULAR DEGENERATION (ICD-362.50) Per DrRankin... his note is reviewed...  Problem # 2:  HYPERTENSION (ICD-401.9) Controlled on meds> added LOSARTAN 25mg /d... Her updated medication list for this problem includes:    Atenolol 25 Mg Tabs (Atenolol) .Marland Kitchen... Take 1 tablet by mouth once a day    Furosemide 40 Mg Tabs (Furosemide) .Marland Kitchen... Take 1 tablet by mouth once a day    Losartan Potassium 25 Mg Tabs (Losartan potassium) .Marland Kitchen... Take 1 tab by mouth once daily...  Orders: TLB-BMP (Basic Metabolic Panel-BMET) (80048-METABOL) TLB-A1C / Hgb A1C (Glycohemoglobin) (83036-A1C) TLB-Lipid Panel (80061-LIPID) TLB-Udip w/ Micro (81001-URINE) TLB-Microalbumin/Creat Ratio, Urine (82043-MALB)  Problem # 3:  ATRIAL FIBRILLATION, CHRONIC (ICD-427.31) Rate control w/ Aten... Her updated medication list for this problem includes:    Adult Aspirin Low Strength 81 Mg Tbdp (Aspirin) .Marland Kitchen... Take 1 tablet by mouth once a day    Atenolol 25 Mg Tabs (Atenolol) .Marland Kitchen... Take 1 tablet by mouth once a day  Problem # 4:  HYPERCHOLESTEROLEMIA (ICD-272.0) TGs are sl elevated and rec to get on better low fat diet, get wt  down... continue Prav40. Her updated medication list for this problem includes:    Pravachol 40 Mg Tabs (Pravastatin sodium) .Marland Kitchen... Take one tablet by mouth at bedtime  Problem # 5:  DM (ICD-250.00) Control is worse w/ A1c= 8.0 on the Metformin ER 500mg /d... REC to incr to 1000mg /d but may need sulfonylurea added. Her updated medication list for this problem includes:    Adult Aspirin Low Strength 81 Mg Tbdp (Aspirin) .Marland Kitchen... Take 1 tablet by mouth once a day    Fortamet 500 Mg Tb24 (Metformin hcl) .Marland Kitchen... Take 1 tab by mouth two times a day...    Losartan Potassium 25 Mg Tabs (Losartan potassium) .Marland Kitchen... Take 1 tab by mouth once daily...  Problem # 6:  UTI'S, HX OF (ICD-V13.00) Treated w/ macrobid via the ER...  Problem # 7:  DEGENERATIVE JOINT DISEASE (ICD-715.90) She has severe arthritis & not alot of options regarding treatment... Her updated medication list for this problem includes:    Adult Aspirin Low Strength 81  Mg Tbdp (Aspirin) .Marland Kitchen... Take 1 tablet by mouth once a day    Hydrocodone-acetaminophen 5-500 Mg Tabs (Hydrocodone-acetaminophen) .Marland Kitchen... 1 tab by mouth every 4-6 hours as needed for pain... not to exceed 3/d.    Tylenol 325 Mg Tabs (Acetaminophen) .Marland Kitchen... As needed alternates this with the hydrocodone  Problem # 8:  OTHER MEDICAL PROBLEMS AS NOTED>>>  Complete Medication List: 1)  Ocuvite Tabs (Multiple vitamins-minerals) .... 2 tabs once daily 2)  Adult Aspirin Low Strength 81 Mg Tbdp (Aspirin) .... Take 1 tablet by mouth once a day 3)  Atenolol 25 Mg Tabs (Atenolol) .... Take 1 tablet by mouth once a day 4)  Furosemide 40 Mg Tabs (Furosemide) .... Take 1 tablet by mouth once a day 5)  Pravachol 40 Mg Tabs (Pravastatin sodium) .... Take one tablet by mouth at bedtime 6)  Fortamet 500 Mg Tb24 (Metformin hcl) .... Take 1 tab by mouth two times a day... 7)  Prilosec 20 Mg Cpdr (Omeprazole) .... Take 1 tablet by mouth once a day 8)  Hydrocodone-acetaminophen 5-500 Mg Tabs  (Hydrocodone-acetaminophen) .Marland Kitchen.. 1 tab by mouth every 4-6 hours as needed for pain... not to exceed 3/d. 9)  Gabapentin 300 Mg Caps (Gabapentin) .... Take 2 caps by mouth at bedtime... 10)  Lidex 0.05 % Crea (Fluocinonide) .... Apply to rash as needed... 11)  Vitamin D 2000 Unit Tabs (Cholecalciferol) .... Take 1 tablet by mouth once a day 12)  Tylenol 325 Mg Tabs (Acetaminophen) .... As needed alternates this with the hydrocodone 13)  Losartan Potassium 25 Mg Tabs (Losartan potassium) .... Take 1 tab by mouth once daily...  Patient Instructions: 1)  Today we updated your med list- see below.... 2)  We added the LOSARTAN (ARB med) for your BP & DM as requested by [facility]... 3)  Today we did your follow up FASTING blood work... please call the "phone tree" in a few days for your lab results.Marland KitchenMarland Kitchen 4)  Stay on the low carb, low fat diet, & stay as active as possible... 5)  NO FALLING ALLOWED!!! 6)  Call for any questions.Marland KitchenMarland Kitchen 7)  Please schedule a follow-up appointment in 6 months. Prescriptions: FORTAMET 500 MG  TB24 (METFORMIN HCL) take 1 tab by mouth two times a day...  #60 x 12   Entered and Authorized by:   Michele Mcalpine MD   Signed by:   Michele Mcalpine MD on 08/22/2010   Method used:   Print then Give to Patient   RxID:   [id number] LOSARTAN POTASSIUM 25 MG TABS (LOSARTAN POTASSIUM) take 1 tab by mouth once daily...  #30 x 12   Entered and Authorized by:   Michele Mcalpine MD   Signed by:   Michele Mcalpine MD on 08/18/2010   Method used:   Print then Give to Patient   RxID:   [id number]    Immunization History:  Influenza Immunization History:    Influenza:  historical (10/01/2009)  Pneumovax Immunization History:    Pneumovax:  historical (08/22/2007)

## 2011-01-06 NOTE — Letter (Signed)
Summary: [facility]  [facility]   Imported By: Sherian Rein 12/24/2010 11:15:02  _____________________________________________________________________  External Attachment:    Type:   Image     Comment:   External Document

## 2011-01-06 NOTE — Letter (Signed)
Summary: [facility]   [facility]   Imported By: Lester [place] 11/15/2010 08:57:18  _____________________________________________________________________  External Attachment:    Type:   Image     Comment:   External Document

## 2011-02-14 ENCOUNTER — Other Ambulatory Visit: Payer: Self-pay | Admitting: Pulmonary Disease

## 2011-02-14 ENCOUNTER — Ambulatory Visit (INDEPENDENT_AMBULATORY_CARE_PROVIDER_SITE_OTHER): Payer: Medicare Other | Admitting: Pulmonary Disease

## 2011-02-14 ENCOUNTER — Encounter: Payer: Self-pay | Admitting: Pulmonary Disease

## 2011-02-14 ENCOUNTER — Other Ambulatory Visit: Payer: Medicare Other

## 2011-02-14 ENCOUNTER — Ambulatory Visit (INDEPENDENT_AMBULATORY_CARE_PROVIDER_SITE_OTHER)
Admission: RE | Admit: 2011-02-14 | Discharge: 2011-02-14 | Disposition: A | Discharge status: Home / Self Care | Payer: Medicare Other | Source: Ambulatory Visit | Attending: Pulmonary Disease | Admitting: Pulmonary Disease

## 2011-02-14 DIAGNOSIS — E7800 Pure hypercholesterolemia, unspecified: Secondary | ICD-10-CM

## 2011-02-14 DIAGNOSIS — I509 Heart failure, unspecified: Secondary | ICD-10-CM

## 2011-02-14 DIAGNOSIS — I4891 Unspecified atrial fibrillation: Secondary | ICD-10-CM

## 2011-02-14 DIAGNOSIS — J984 Other disorders of lung: Secondary | ICD-10-CM

## 2011-02-14 DIAGNOSIS — K219 Gastro-esophageal reflux disease without esophagitis: Secondary | ICD-10-CM

## 2011-02-14 DIAGNOSIS — E119 Type 2 diabetes mellitus without complications: Secondary | ICD-10-CM

## 2011-02-14 DIAGNOSIS — I10 Essential (primary) hypertension: Secondary | ICD-10-CM

## 2011-02-14 DIAGNOSIS — H3530 Unspecified macular degeneration: Secondary | ICD-10-CM

## 2011-02-14 DIAGNOSIS — I872 Venous insufficiency (chronic) (peripheral): Secondary | ICD-10-CM

## 2011-02-14 DIAGNOSIS — E559 Vitamin D deficiency, unspecified: Secondary | ICD-10-CM

## 2011-02-14 LAB — HEPATIC FUNCTION PANEL
ALT: 17 U/L (ref 0–35)
Albumin: 3.4 g/dL — ABNORMAL LOW (ref 3.5–5.2)
Total Protein: 6.2 g/dL (ref 6.0–8.3)

## 2011-02-14 LAB — CBC WITH DIFFERENTIAL/PLATELET
Basophils Relative: 0.5 % (ref 0.0–3.0)
Eosinophils Relative: 2.4 % (ref 0.0–5.0)
HCT: 38.3 % (ref 36.0–46.0)
Hemoglobin: 13 g/dL (ref 12.0–15.0)
Lymphs Abs: 1.5 10*3/uL (ref 0.7–4.0)
MCV: 96.3 fl (ref 78.0–100.0)
Monocytes Absolute: 0.9 10*3/uL (ref 0.1–1.0)
Neutro Abs: 3.3 10*3/uL (ref 1.4–7.7)
Platelets: 156 10*3/uL (ref 150.0–400.0)
RBC: 3.98 Mil/uL (ref 3.87–5.11)
WBC: 6 10*3/uL (ref 4.5–10.5)

## 2011-02-14 LAB — BASIC METABOLIC PANEL
BUN: 27 mg/dL — ABNORMAL HIGH (ref 6–23)
CO2: 30 mEq/L (ref 19–32)
Calcium: 8.6 mg/dL (ref 8.4–10.5)
Chloride: 100 mEq/L (ref 96–112)
Creatinine, Ser: 1.5 mg/dL — ABNORMAL HIGH (ref 0.4–1.2)
Glucose, Bld: 136 mg/dL — ABNORMAL HIGH (ref 70–99)

## 2011-02-15 LAB — CONVERTED CEMR LAB: Vit D, 25-Hydroxy: 50 ng/mL (ref 30–89)

## 2011-02-16 ENCOUNTER — Encounter: Payer: Self-pay | Admitting: Adult Health

## 2011-02-18 LAB — CBC
HCT: 41.3 % (ref 36.0–46.0)
MCHC: 34.4 g/dL (ref 30.0–36.0)
RDW: 13.4 % (ref 11.5–15.5)

## 2011-02-18 LAB — URINALYSIS, ROUTINE W REFLEX MICROSCOPIC
Glucose, UA: NEGATIVE mg/dL
Hgb urine dipstick: NEGATIVE
Protein, ur: NEGATIVE mg/dL
pH: 7 (ref 5.0–8.0)

## 2011-02-18 LAB — URINE MICROSCOPIC-ADD ON

## 2011-02-18 LAB — URINE CULTURE
Colony Count: >=100000
Culture  Setup Time: 201108311357

## 2011-02-18 LAB — BASIC METABOLIC PANEL
BUN: 19 mg/dL (ref 6–23)
Calcium: 9.1 mg/dL (ref 8.4–10.5)
Creatinine, Ser: 1.06 mg/dL (ref 0.4–1.2)
GFR calc non Af Amer: 48 mL/min — ABNORMAL LOW (ref >60)
Glucose, Bld: 172 mg/dL — ABNORMAL HIGH (ref 70–99)

## 2011-02-18 LAB — GLUCOSE, CAPILLARY: Glucose-Capillary: 180 mg/dL — ABNORMAL HIGH (ref 70–99)

## 2011-02-22 NOTE — Assessment & Plan Note (Signed)
Summary: 6 month rov   CC:  4 month ROV & review of mult medical problems....  History of Present Illness: 77 y/o WF here for an add-on visit due to a fall yest... she has mult medical problems as noted below...    ~  August 18, 2010:  she fell 2wks ago- hit occiput w/ hematoma on scalp> ER eval reviewed: lost balance; fell into bathtub; no LOC;  labs- OK, BS=180, +UTI given macrobid;  CT Brain= atrophy, chr microvasc dis, NAD;  CT CSpine= multilevel spondylosis & suboccipital hematoma in soft tissues w/o fx etc...  She had Ophthal f/u 3/11 DrRankin> senile macular degen OU, vitreous detach OS, bilat Drusen...  she received letter from [facility] DM & needs to be on ACE or ARB, therefore start Losartan 25mg /d...   ~  October 11, 2010:  add-on appt today after fall at home yest> she has been sl dizzy in the AMs & fell in the bathroom yest AM striking right occiput on commode- no blackout etc, didn't break skin, no hematoma but area bruised, min tender, neck OK, etc... she notes severe arthritis w/ pain & left knee gives way (right TKR in past)... she had recent infected callous tip of left 3rd hammer toe w/ drainage & antibiotic rx from DrSikora- much improved now (prob neuropathy & they are planning to get DM shoes)... BP appears well controlled on meds & no postural changes apparent, but sl higher in AMs & we discussed poss strategies w/ timing of pills etc... Plan- check CT Brain w/o contrast>    ~  February 14, 2011:  4 mo ROV- 77 y/o now & no further falls...    BP controlled on Aten, Losar, & Lasix> 110.54 today, no postural change & feeling well w/o CP, palpit, ch in SOB,syncope, etc;  CXR f/u today w/ Cardiomeg (no change), clear/ NAD;  Labs look good & BNP=211 (continue same meds);  she has AFib w/ rate control strategy> doing well on meds +ASA...    Chol remains stable on Prav40 + diet;  not fasting today> continue same...    DM controlled on her Metformin Bid + diet> BS 136, A1c 6.8; continue  same Rx...    Mild renal insuffic w/ BUN 27, creat 1.5 on meds +Lasix 40mg /d; continue same for now...    Other problems: GI stable; Vit D level= 50 & supplemented w/ 2000 u daily; no recent falls w/ her new DM shows from Podiatrist...   Current Problem List:  MACULAR DEGENERATION (ICD-362.50) - on OCUVITES daily... s/p laser surg by DrRankin... she cannot see well enough to read... they tried Bilberry Juice as heard on the [facility] [employer]- it's helping...  ~  3/10: had f/u w/ DrRankin- stabilized... nothing else he can do... f/u 78yr.  ~  3/11:  had f/u DrRankin> senile macular degen OU (worsening), vitreous detach OS (stable), bilat Drusen...    PULMONARY NODULE (ICD-518.89) - RUL nodule, eval 2007 w/ bx = granuloma...   ~  Serial CXRs have been stable...  ~  CXR 3/11 showed chr changes & calcif granulomas, prob calcif of LAD seen on lat.  HYPERTENSION (ICD-401.9) - she takes ASA 81mg /d, ATENOLOL 25mg /d, LOSARTAN 25mg /d, & LASIX 40mg /d... BP=136/82, no postural changes,  and tol meds well... denies HA, CP, palpit, change in dyspnea, edema, etc...   ~  9/11:  she received letter from [facility] stating she should be on ACE or ARB Rx> add Losartan 25mg /d...  CONGESTIVE HEART FAILURE (ICD-428.0) -  on above meds...  ~  2DEcho 6/06 showed biatrial enlargement, mild MR/TR, norm LVF...  ~  labs 3/11 showed BNP= 189...  ATRIAL FIBRILLATION, CHRONIC (ICD-427.31) - prev on Coumadin, pt & family decided to stop coumadin in 2007, & now on ASA daily... rate control strategy w/ Atenolol...  VENOUS INSUFFICIENCY (ICD-459.81) - on low sodium diet, and Lasix... no edema...  HYPERCHOLESTEROLEMIA (ICD-272.0) - on PRAVACHOL 40mg Qhs and tol well...  ~  FLP 11/08 showed TChol 151, TG 152, HDL 38, LDL 82... > continue med & diet efforts.  ~  FLP 3/09 shows TChol 206, TG 332, HDL 40, LDL 97... > cont same, get wt down!  ~  FLP 3/10 showed TChol 172, TG 214, HDL 43, LDL 92... > improved- continue same.  ~  FLP 3/11  showed TChol 167, TG 236, HDL 46, LDL 83  ~  FLP 9/11 showed TChol 144, TG 212, HDL 36, LDL 72... reminded of low fat diet rec...  DM (ICD-250.00) - on METFORMIN ER 500mg Bid now...  ~  labs 11/08 showed FBS=182, HgA1c=7.1... >continue attempts at diet control.  ~  labs 3/09 showed  FBS=178, HgA1c=7.2... >she will need to start meds: Metformin ER 500mg Qam.  ~  labs 7/09 showed BS= 138, HgA1c= 6.7.Marland KitchenMarland Kitchenrec- keep same.   ~  labs 3/10 (wt=162#) showed 139, HgA1c= 6.6... > continue same meds.  ~  labs 9/10 (wt=175#) showed BS= 158, A1c= 6.8.Marland KitchenMarland Kitchen needs better diet.  ~  labs 3/11 (wt=179#) showed BS= 155, A1c= 7.6.Marland KitchenMarland Kitchen may need incr meds! get wt down.  ~  labs 9/11 (wt=173#) showed BS= 166, A1c= 8.0.Marland KitchenMarland Kitchen worsening A1c- rec incr Metform to 2/d...  ~  Urine microalb 9/11 = neg...  GERD (ICD-530.81) - on PRILOSEC 20mg /d, doing well without heartburn, n/v, etc... Hx HH, GERD, and prev DU... last EGD was 7/02 showing HH, reflux, stricture, gastric ulcer... she has refused colonoscopies in the past...  UTI'S, HX OF (ICD-V13.00) - hx UTI's, s/p hysterectomy, prev bladder surg...  ~  8/11:  ER visit after fall w/ UTI Rx'd w/ Macrobid...  DEGENERATIVE JOINT DISEASE (ICD-715.90)  - severe arthritis w/ prev right TKR 1990... followed by DrGioffre, DrBednarz, DrSikora (hammer toes)- not willing to consider more surg... uses Vicodin 2-3 per day as needed, shots not helpful in the past... leg discomfort may be neuropathy (but monofilament test is normal)- on Neurontin Rx taking 300mg - 2Qhs...  ~  she ambulates w/ walker, she has had phys therapy & does chair exercises, she has knee braces, etc...  VITAMIN D DEFICIENCY (ICD-268.9) - not currently taking any supplement.  ~  labs 7/09 showed Vit D level = 11... rec> start Vit D 50000 u weekly (but she stopped after 43mo).  ~  labs 3/10 showed Vit D level = 30... rec> change to 2000 u daily.  ABNORMALITY OF GAIT (ICD-781.2) & Frequent FALLS>   ~  9/11:  she fell 2wks ago-  hit occiput w/ hematoma on scalp> ER eval reviewed: lost balance; fell into bathtub; no LOC;  labs- OK, BS=180, +UTI given macrobid;  CT Brain= atrophy, chr microvasc dis, NAD;  CT CSpine= multilevel spondylosis & suboccipital hematoma in soft tissues w/o fx etc...    ~  11/11:  she fell yest- hit right occiput on commode w/ bruise but no hematoma; didn't go to the ER; no focal neuro deficits & we discussed checking CT Brain to be sure no subdural etc...  ANXIETY (ICD-300.00  DERMATITIS - uses generic LIDEX E cream as  needed...  Health Maintenance:  she gets the yearly Flu vaccine each Autumnt;  and she had PNEUMOVAX  ~2005 at [facility], she says.   Preventive Screening-Counseling & Management  Alcohol-Tobacco     Smoking Status: never  Allergies: 1)  ! Voltaren 2)  ! Celebrex  Comments:  Nurse/Medical Assistant: The patient's medications and allergies were reviewed with the patient and were updated in the Medication and Allergy Lists.  Past History:  Past Medical History: MACULAR DEGENERATION (ICD-362.50) PULMONARY NODULE (ICD-518.89) HYPERTENSION (ICD-401.9) CONGESTIVE HEART FAILURE (ICD-428.0) ATRIAL FIBRILLATION, CHRONIC (ICD-427.31) VENOUS INSUFFICIENCY (ICD-459.81) HYPERCHOLESTEROLEMIA (ICD-272.0) DM (ICD-250.00) GERD (ICD-530.81) UTI'S, HX OF (ICD-V13.00) DEGENERATIVE JOINT DISEASE (ICD-715.90) VITAMIN D DEFICIENCY (ICD-268.9) ABNORMALITY OF GAIT (ICD-781.2) ANXIETY (ICD-300.00)  Past Surgical History: S/P cholecystectomy - 6/07 by Dorothe Pea S/P hysterectomy & bladder surgery S/P right TKR - 1990 by DrGioffre  Family History: Reviewed history and no changes required.  Social History: Reviewed history and no changes required. never smoked  Review of Systems      See HPI       The patient complains of vision loss, decreased hearing, dyspnea on exertion, muscle weakness, and difficulty walking.  The patient denies anorexia, fever, weight loss, weight gain,  hoarseness, chest pain, syncope, peripheral edema, prolonged cough, headaches, hemoptysis, abdominal pain, melena, hematochezia, severe indigestion/heartburn, hematuria, incontinence, suspicious skin lesions, transient blindness, depression, unusual weight change, abnormal bleeding, enlarged lymph nodes, and angioedema.    Vital Signs:  Patient profile:   77 year old female Height:      63 inches Weight:      170.25 pounds O2 Sat:      95 % on Room air Temp:     97.5 degrees F oral Pulse rate:   63 / minute BP sitting:   110 / 54  (right arm) Cuff size:   regular  Vitals Entered By: Randell Loop CMA (February 14, 2011 10:23 AM)  O2 Sat at Rest %:  95 O2 Flow:  Room air CC: 4 month ROV & review of mult medical problems... Is Patient Diabetic? Yes Pain Assessment Patient in pain? no      Comments meds updated today with pt and her daughter   Physical Exam  Additional Exam:  WD, WN, 77 y/o WF chr ill appearing but in NAD... GENERAL:  Alert & oriented; pleasant & cooperative... sm bruise right occiput, no hematoma. HEENT:  [place]/AT, EOM-wnl, poor vision from macular degen; EACs-clear, TMs-wnl, NOSE-clear, THROAT-clear w/ dry MM's. NECK:  Supple w/ fairROM; no JVD; normal carotid impulses w/o bruits; no thyromegaly or nodules palpated; no lymphadenopathy. CHEST:  Clear to P & A; without wheezes/ rales/ or rhonchi. HEART:  irreg, gr 1/6 SEM without rubs or gallops detected... ABDOMEN:  Soft & nontender; normal bowel sounds; no organomegaly or masses detected. EXT:  severe arthritic changes & rightTKR; no varicose veins/ +venous insuffic/ tr edema. NEURO:  CN's intact; +gait abn, no focal neuro deficits x sl decr sensation in LE's... DERM:  No lesions noted; no rash etc...    Impression & Recommendations:  Problem # 1:  HYPERTENSION (ICD-401.9)    BP controlled on Aten, Losar, & Lasix> 110.54 today, no postural change & feeling well w/o CP, palpit, ch in SOB,syncope, etc;  CXR f/u  today w/ Cardiomeg (no change), clear/ NAD;  Labs look good & BNP=211 (continue same meds);  she has AFib w/ rate control strategy> doing well on meds +ASA... Her updated medication list for this problem includes:    Atenolol  25 Mg Tabs (Atenolol) .Marland Kitchen... Take 1 tablet by mouth once a day    Losartan Potassium 25 Mg Tabs (Losartan potassium) .Marland Kitchen... Take 1 tab by mouth once daily...    Furosemide 40 Mg Tabs (Furosemide) .Marland Kitchen... Take 1/2 to 1 tablet by mouth in the am as directed...  Orders: T-2 View CXR (71020TC) TLB-BMP (Basic Metabolic Panel-BMET) (80048-METABOL) TLB-Hepatic/Liver Function Pnl (80076-HEPATIC) TLB-CBC Platelet - w/Differential (85025-CBCD) TLB-TSH (Thyroid Stimulating Hormone) (84443-TSH) TLB-BNP (B-Natriuretic Peptide) (83880-BNPR) TLB-A1C / Hgb A1C (Glycohemoglobin) (83036-A1C) T-Vitamin D (25-Hydroxy) ([address]-60454)  Problem # 2:  HYPERCHOLESTEROLEMIA (ICD-272.0)    Chol remains stable on Prav40 + diet;  not fasting today> continue same... Her updated medication list for this problem includes:    Pravachol 40 Mg Tabs (Pravastatin sodium) .Marland Kitchen... Take one tablet by mouth at bedtime  Problem # 3:  DM (ICD-250.00)    DM controlled on her Metformin Bid + diet> BS 136, A1c 6.8; continue same Rx... Her updated medication list for this problem includes:    Adult Aspirin Low Strength 81 Mg Tbdp (Aspirin) .Marland Kitchen... Take 1 tablet by mouth once a day    Losartan Potassium 25 Mg Tabs (Losartan potassium) .Marland Kitchen... Take 1 tab by mouth once daily...    Glucophage Xr 500 Mg Xr24h-tab (Metformin hcl) .Marland Kitchen... Take 1 tablet by mouth two times a day  Problem # 4:  GERD (ICD-530.81) GI is stable>  continue the PPI rx... Her updated medication list for this problem includes:    Prilosec 20 Mg Cpdr (Omeprazole) .Marland Kitchen... Take 1 tablet by mouth once a day  Problem # 5:  DEGENERATIVE JOINT DISEASE (ICD-715.90) She has severe DJd but pain managed & she is being careful (no recent falls)... Her updated  medication list for this problem includes:    Adult Aspirin Low Strength 81 Mg Tbdp (Aspirin) .Marland Kitchen... Take 1 tablet by mouth once a day    Hydrocodone-acetaminophen 5-500 Mg Tabs (Hydrocodone-acetaminophen) .Marland Kitchen... 1 tab by mouth every 4-6 hours as needed for pain... not to exceed 3/d.    Tylenol 325 Mg Tabs (Acetaminophen) .Marland Kitchen... As needed alternates this with the hydrocodone  Problem # 6:  OTHER MEDICAL PROBLEMS AS NOTED>>>  Complete Medication List: 1)  Ocuvite Tabs (Multiple vitamins-minerals) .... 2 tabs once daily 2)  Adult Aspirin Low Strength 81 Mg Tbdp (Aspirin) .... Take 1 tablet by mouth once a day 3)  Atenolol 25 Mg Tabs (Atenolol) .... Take 1 tablet by mouth once a day 4)  Losartan Potassium 25 Mg Tabs (Losartan potassium) .... Take 1 tab by mouth once daily.Marland KitchenMarland Kitchen 5)  Furosemide 40 Mg Tabs (Furosemide) .... Take 1/2 to 1 tablet by mouth in the am as directed... 6)  Pravachol 40 Mg Tabs (Pravastatin sodium) .... Take one tablet by mouth at bedtime 7)  Glucophage Xr 500 Mg Xr24h-tab (Metformin hcl) .... Take 1 tablet by mouth two times a day 8)  Prilosec 20 Mg Cpdr (Omeprazole) .... Take 1 tablet by mouth once a day 9)  Hydrocodone-acetaminophen 5-500 Mg Tabs (Hydrocodone-acetaminophen) .Marland Kitchen.. 1 tab by mouth every 4-6 hours as needed for pain... not to exceed 3/d. 10)  Gabapentin 300 Mg Caps (Gabapentin) .... Take 2 caps by mouth at bedtime... 11)  Tylenol 325 Mg Tabs (Acetaminophen) .... As needed alternates this with the hydrocodone 12)  Vitamin D 2000 Unit Tabs (Cholecalciferol) .... Take 1 tablet by mouth once a day 13)  Lidex 0.05 % Crea (Fluocinonide) .... Apply to rash as needed... 14)  Meclizine Hcl 25 Mg  Tabs (Meclizine hcl) .... Take 1/2 to 1 tab by mouth every 6 h as needed for dizziness...  Patient Instructions: 1)  Today we updated your med list- see below.... 2)  We decided to decrease your LASIX/ Furosemide to 1/2 tab each AM (you may take a whole pill as needed for excess  fluid retetion).Marland KitchenMarland Kitchen 3)  Today we did your follow up CXR & non-fasting blood work... please call the "phone tree" in a few days for your lab results.Marland KitchenMarland Kitchen 4)  Please start to loosen the dry ear wax w/ DEBROX or similar OTC ear wax kit medication... then sched an appt w/ our Nurse Practitioner to have your ears lavaged clear... 5)  Call for any problems.Marland KitchenMarland Kitchen 6)  Please schedule a follow-up appointment in 6 months.   Immunization History:  Tetanus/Td Immunization History:    Tetanus/Td:  historical (09/13/2010)

## 2011-03-01 ENCOUNTER — Ambulatory Visit (INDEPENDENT_AMBULATORY_CARE_PROVIDER_SITE_OTHER): Payer: Medicare Other | Admitting: Adult Health

## 2011-03-01 ENCOUNTER — Encounter: Payer: Self-pay | Admitting: Adult Health

## 2011-03-01 VITALS — BP 130/78 | HR 68 | Temp 96.7°F | Ht 63.0 in | Wt 170.0 lb

## 2011-03-01 DIAGNOSIS — H6120 Impacted cerumen, unspecified ear: Secondary | ICD-10-CM

## 2011-03-01 NOTE — Assessment & Plan Note (Signed)
Bilateral cerumen impaction  Ear irrigation w/ wax extraction -tolerated well w/ large amount of wax removed.  Plan:  Debrox As needed

## 2011-03-01 NOTE — Patient Instructions (Signed)
May use debrox As needed  For ear wax follow up Dr. Kriste Basque  As planned and prn  Please contact office for sooner follow up if symptoms do not improve or worsen or seek emergency care

## 2011-03-01 NOTE — Progress Notes (Signed)
  Subjective:    Patient ID: [name], female    DOB: Jul 14, 1911, 77 y.o.   MRN: [id number]  HPI Comments: 77 yo WF with known hx of CHF, HTN, DM and DJD  03/01/2011 OV -Presents for work in visit for bilateral ear wax.  Cerumen impaction at last ov. Ears stopped up and decreased hearing. Wears hearing aids daily . Has had cerumen impaction in past. NO ear pain or drainage.  Used OTC without help      Review of Systems   Constitutional:   No  weight loss, night sweats,  Fevers, chills, fatigue, lassitude. HEENT:   No headaches,  Difficulty swallowing,  Tooth/dental problems,  Sore throat,                No sneezing, itching, ear ache, nasal congestion, post nasal drip,      Objective:   Physical Exam Gen: Pleasant, well-nourished, in no distress,  normal affect  ENT: No lesions,  mouth clear,  oropharynx clear, no postnasal drip, bilateral cerumen impaction  Neck: No JVD, no TMG, no carotid bruits         Assessment & Plan:

## 2011-03-18 ENCOUNTER — Other Ambulatory Visit: Payer: Self-pay | Admitting: Pulmonary Disease

## 2011-03-22 ENCOUNTER — Telehealth: Payer: Self-pay | Admitting: Pulmonary Disease

## 2011-03-22 MED ORDER — HYDROCODONE-ACETAMINOPHEN 5-500 MG PO TABS: ORAL_TABLET | ORAL | Status: DC

## 2011-03-22 NOTE — Telephone Encounter (Signed)
Called to speak with Gigi Gin and the female that answered the phone stated "Gigi Gin is downstairs in the gym", I advised who I was and the female stated she will have Gigi Gin give me a return call. I checked all of our medication refill fax request and the only one we have is for Hydrocodone/APAP 5/500. Refill on that medication called to pharmacy.

## 2011-03-23 ENCOUNTER — Other Ambulatory Visit: Payer: Self-pay | Admitting: *Deleted

## 2011-03-23 MED ORDER — HYDROCODONE-ACETAMINOPHEN 5-500 MG PO TABS: ORAL_TABLET | ORAL | Status: DC

## 2011-03-23 NOTE — Telephone Encounter (Signed)
Pt and daughter aware RX sent to pharmacy and have already picked this up. Nothing further is needed at this time.

## 2011-03-23 NOTE — Telephone Encounter (Signed)
Refill of hydrocodone faxed back to the pharmacy

## 2011-03-30 ENCOUNTER — Other Ambulatory Visit: Payer: Self-pay | Admitting: Pulmonary Disease

## 2011-04-19 ENCOUNTER — Emergency Department (HOSPITAL_COMMUNITY): Payer: Medicare Other

## 2011-04-19 ENCOUNTER — Inpatient Hospital Stay (HOSPITAL_COMMUNITY)
Admission: EM | Admit: 2011-04-19 | Discharge: 2011-04-30 | DRG: 291 | Disposition: A | Discharge status: Skilled Nursing Facility | Payer: Medicare Other | Attending: Internal Medicine | Admitting: Internal Medicine

## 2011-04-19 ENCOUNTER — Telehealth: Payer: Self-pay | Admitting: Pulmonary Disease

## 2011-04-19 DIAGNOSIS — Z79899 Other long term (current) drug therapy: Secondary | ICD-10-CM

## 2011-04-19 DIAGNOSIS — I10 Essential (primary) hypertension: Secondary | ICD-10-CM | POA: Diagnosis present

## 2011-04-19 DIAGNOSIS — E7800 Pure hypercholesterolemia, unspecified: Secondary | ICD-10-CM | POA: Diagnosis present

## 2011-04-19 DIAGNOSIS — E46 Unspecified protein-calorie malnutrition: Secondary | ICD-10-CM | POA: Diagnosis not present

## 2011-04-19 DIAGNOSIS — J189 Pneumonia, unspecified organism: Secondary | ICD-10-CM | POA: Diagnosis not present

## 2011-04-19 DIAGNOSIS — B961 Klebsiella pneumoniae [K. pneumoniae] as the cause of diseases classified elsewhere: Secondary | ICD-10-CM | POA: Diagnosis present

## 2011-04-19 DIAGNOSIS — G243 Spasmodic torticollis: Secondary | ICD-10-CM | POA: Diagnosis present

## 2011-04-19 DIAGNOSIS — K5900 Constipation, unspecified: Secondary | ICD-10-CM | POA: Diagnosis present

## 2011-04-19 DIAGNOSIS — R0602 Shortness of breath: Secondary | ICD-10-CM

## 2011-04-19 DIAGNOSIS — H3530 Unspecified macular degeneration: Secondary | ICD-10-CM | POA: Diagnosis present

## 2011-04-19 DIAGNOSIS — K120 Recurrent oral aphthae: Secondary | ICD-10-CM | POA: Diagnosis not present

## 2011-04-19 DIAGNOSIS — R5381 Other malaise: Secondary | ICD-10-CM | POA: Diagnosis not present

## 2011-04-19 DIAGNOSIS — Z66 Do not resuscitate: Secondary | ICD-10-CM | POA: Diagnosis present

## 2011-04-19 DIAGNOSIS — B952 Enterococcus as the cause of diseases classified elsewhere: Secondary | ICD-10-CM | POA: Diagnosis present

## 2011-04-19 DIAGNOSIS — K219 Gastro-esophageal reflux disease without esophagitis: Secondary | ICD-10-CM | POA: Diagnosis present

## 2011-04-19 DIAGNOSIS — M129 Arthropathy, unspecified: Secondary | ICD-10-CM | POA: Diagnosis present

## 2011-04-19 DIAGNOSIS — H9110 Presbycusis, unspecified ear: Secondary | ICD-10-CM | POA: Diagnosis present

## 2011-04-19 DIAGNOSIS — Z7982 Long term (current) use of aspirin: Secondary | ICD-10-CM

## 2011-04-19 DIAGNOSIS — I2789 Other specified pulmonary heart diseases: Secondary | ICD-10-CM | POA: Diagnosis present

## 2011-04-19 DIAGNOSIS — I5031 Acute diastolic (congestive) heart failure: Principal | ICD-10-CM | POA: Diagnosis present

## 2011-04-19 DIAGNOSIS — H9190 Unspecified hearing loss, unspecified ear: Secondary | ICD-10-CM | POA: Diagnosis present

## 2011-04-19 DIAGNOSIS — I4891 Unspecified atrial fibrillation: Secondary | ICD-10-CM | POA: Diagnosis present

## 2011-04-19 DIAGNOSIS — R339 Retention of urine, unspecified: Secondary | ICD-10-CM | POA: Diagnosis not present

## 2011-04-19 DIAGNOSIS — Z954 Presence of other heart-valve replacement: Secondary | ICD-10-CM

## 2011-04-19 DIAGNOSIS — I959 Hypotension, unspecified: Secondary | ICD-10-CM | POA: Diagnosis not present

## 2011-04-19 DIAGNOSIS — I509 Heart failure, unspecified: Secondary | ICD-10-CM | POA: Diagnosis present

## 2011-04-19 DIAGNOSIS — N390 Urinary tract infection, site not specified: Secondary | ICD-10-CM | POA: Diagnosis present

## 2011-04-19 LAB — DIFFERENTIAL
Basophils Absolute: 0.1 10*3/uL (ref 0.0–0.1)
Basophils Relative: 1 % (ref 0–1)
Eosinophils Absolute: 0 10*3/uL (ref 0.0–0.7)
Lymphocytes Relative: 31 % (ref 12–46)
Neutrophils Relative %: 52 % (ref 43–77)

## 2011-04-19 LAB — URINALYSIS, ROUTINE W REFLEX MICROSCOPIC
Nitrite: NEGATIVE
Specific Gravity, Urine: 1.018 (ref 1.005–1.030)
pH: 6 (ref 5.0–8.0)

## 2011-04-19 LAB — COMPREHENSIVE METABOLIC PANEL
Albumin: 3.4 g/dL — ABNORMAL LOW (ref 3.5–5.2)
BUN: 21 mg/dL (ref 6–23)
Chloride: 95 mEq/L — ABNORMAL LOW (ref 96–112)
Creatinine, Ser: 1.08 mg/dL (ref 0.4–1.2)
GFR calc non Af Amer: 47 mL/min — ABNORMAL LOW (ref >60)
Total Bilirubin: 0.4 mg/dL (ref 0.3–1.2)

## 2011-04-19 LAB — PRO B NATRIURETIC PEPTIDE: Pro B Natriuretic peptide (BNP): 1743 pg/mL — ABNORMAL HIGH (ref 0–450)

## 2011-04-19 LAB — CBC
Hemoglobin: 13.7 g/dL (ref 12.0–15.0)
RBC: 4.38 MIL/uL (ref 3.87–5.11)
WBC: 6.6 10*3/uL (ref 4.0–10.5)

## 2011-04-19 NOTE — Telephone Encounter (Signed)
Cannot walk well, BP high May be UTI but 'something not right'  Adv - ER visit

## 2011-04-20 DIAGNOSIS — I319 Disease of pericardium, unspecified: Secondary | ICD-10-CM

## 2011-04-20 LAB — CARDIAC PANEL(CRET KIN+CKTOT+MB+TROPI)
CK, MB: 2.8 ng/mL (ref 0.3–4.0)
CK, MB: 3 ng/mL (ref 0.3–4.0)
Relative Index: INVALID (ref 0.0–2.5)
Total CK: 74 U/L (ref 7–177)
Troponin I: <0.3 ng/mL (ref <0.30)
Troponin I: <0.3 ng/mL (ref <0.30)

## 2011-04-20 LAB — BASIC METABOLIC PANEL
BUN: 21 mg/dL (ref 6–23)
CO2: 29 mEq/L (ref 19–32)
Chloride: 92 mEq/L — ABNORMAL LOW (ref 96–112)
Creatinine, Ser: 1.08 mg/dL (ref 0.4–1.2)
Glucose, Bld: 127 mg/dL — ABNORMAL HIGH (ref 70–99)

## 2011-04-20 LAB — DIFFERENTIAL
Band Neutrophils: 3 % (ref 0–10)
Basophils Absolute: 0 10*3/uL (ref 0.0–0.1)
Basophils Relative: 0 % (ref 0–1)
Eosinophils Absolute: 0 10*3/uL (ref 0.0–0.7)
Lymphs Abs: 1.4 10*3/uL (ref 0.7–4.0)
Myelocytes: 0 %
Promyelocytes Absolute: 0 %

## 2011-04-20 LAB — CK TOTAL AND CKMB (NOT AT ARMC)
CK, MB: 3 ng/mL (ref 0.3–4.0)
Total CK: 73 U/L (ref 7–177)

## 2011-04-20 LAB — GLUCOSE, CAPILLARY
Glucose-Capillary: 126 mg/dL — ABNORMAL HIGH (ref 70–99)
Glucose-Capillary: 142 mg/dL — ABNORMAL HIGH (ref 70–99)
Glucose-Capillary: 181 mg/dL — ABNORMAL HIGH (ref 70–99)

## 2011-04-20 LAB — CBC
Hemoglobin: 13.1 g/dL (ref 12.0–15.0)
MCHC: 33.7 g/dL (ref 30.0–36.0)
Platelets: 141 10*3/uL — ABNORMAL LOW (ref 150–400)
RBC: 4.28 MIL/uL (ref 3.87–5.11)

## 2011-04-20 LAB — MAGNESIUM: Magnesium: 1.5 mg/dL (ref 1.5–2.5)

## 2011-04-21 LAB — COMPREHENSIVE METABOLIC PANEL
AST: 28 U/L (ref 0–37)
Albumin: 3.1 g/dL — ABNORMAL LOW (ref 3.5–5.2)
Chloride: 95 mEq/L — ABNORMAL LOW (ref 96–112)
Creatinine, Ser: 1.34 mg/dL — ABNORMAL HIGH (ref 0.4–1.2)
GFR calc Af Amer: 44 mL/min — ABNORMAL LOW (ref >60)
Sodium: 135 mEq/L (ref 135–145)
Total Bilirubin: 0.5 mg/dL (ref 0.3–1.2)

## 2011-04-21 LAB — GLUCOSE, CAPILLARY
Glucose-Capillary: 179 mg/dL — ABNORMAL HIGH (ref 70–99)
Glucose-Capillary: 157 mg/dL — ABNORMAL HIGH (ref 70–99)
Glucose-Capillary: 211 mg/dL — ABNORMAL HIGH (ref 70–99)

## 2011-04-22 ENCOUNTER — Inpatient Hospital Stay (HOSPITAL_COMMUNITY): Payer: Medicare Other

## 2011-04-22 LAB — COMPREHENSIVE METABOLIC PANEL
ALT: 18 U/L (ref 0–35)
AST: 37 U/L (ref 0–37)
CO2: 30 mEq/L (ref 19–32)
Chloride: 95 mEq/L — ABNORMAL LOW (ref 96–112)
GFR calc Af Amer: 37 mL/min — ABNORMAL LOW (ref >60)
GFR calc non Af Amer: 30 mL/min — ABNORMAL LOW (ref >60)
Sodium: 136 mEq/L (ref 135–145)
Total Bilirubin: 0.4 mg/dL (ref 0.3–1.2)

## 2011-04-22 LAB — GLUCOSE, CAPILLARY: Glucose-Capillary: 185 mg/dL — ABNORMAL HIGH (ref 70–99)

## 2011-04-22 LAB — DIFFERENTIAL
Band Neutrophils: 0 % (ref 0–10)
Basophils Absolute: 0.1 10*3/uL (ref 0.0–0.1)
Basophils Relative: 1 % (ref 0–1)
Myelocytes: 0 %
Neutrophils Relative %: 57 % (ref 43–77)
Promyelocytes Absolute: 0 %

## 2011-04-22 LAB — URINE CULTURE: Colony Count: >=100000

## 2011-04-22 LAB — CBC
Hemoglobin: 13.4 g/dL (ref 12.0–15.0)
RBC: 4.37 MIL/uL (ref 3.87–5.11)
WBC: 7.6 10*3/uL (ref 4.0–10.5)

## 2011-04-22 NOTE — Op Note (Signed)
NAME:  [name], [name]                   ACCOUNT NO.:  [id number]   MEDICAL RECORD NO.:  [id number]          PATIENT TYPE:  AMB   LOCATION:  [facility]                         FACILITY:  [facility]   PHYSICIAN:  Alford Highland. Rankin, M.D.   DATE OF BIRTH:  1911/06/10   DATE OF PROCEDURE:  07/04/2005  DATE OF DISCHARGE:                                 OPERATIVE REPORT   PREOPERATIVE DIAGNOSIS:  Opaque intraocular lens right eye.   POSTOPERATIVE DIAGNOSIS:  Opaque intraocular lens right eye.   PROCEDURE:  Exchange of intraocular lens - right eye.   ANESTHESIA:  Local retrobulbar muscular control.   SURGEON:  Fawn Kirk, M.D.   INDICATION FOR PROCEDURE:  The patient is a 77 year old woman who has  profound visual loss, level of 20/100s, on the basis of intrinsic  opacification progressing in a silicone intraocular lens.  This is an  attempt to remove this silicone lens and its opacity entirely and to replace  it with a secondary all-PMMA lens.  The patient understands the risks of  anesthesia and to the eye including but not limited to hemorrhage,  infection, scarring, need for further surgery, no change in vision, loss of  vision or progression of disease despite intervention.  Appropriate signed  consent was obtained, patient taken to the operating room.  In the operating  room appropriate monitoring was followed by mild sedation.  Marcaine 0.75%  delivered in 5 mL retrobulbar, an additional 5 mL laterally in fashion of  modified Darel Hong. The right periocular region was sterilely prepped and  draped in the usual ophthalmic fashion.  A lid speculum applied.  Microscope  placed in position.  A limited peritomy was carried out superiorly.  A 15  gauge Superblade was then used in the anterior chamber at the edges of a  previous superior limbal wound.  Anterior chamber was deepened with Viscoat.  A Sinskey hook was then used to engage the intraocular lens and deliver the  optic eye anterior to the capsule  opening and then rotate to remove the  Prolene haptics.  This was carried out without difficulty.  The wound was  now enlarged, deepening, protecting the corneal end dealing with Viscoat.  The intraocular lens was removed in one piece.  An all-PMMA [id number] +20 power  was then placed into the sulcus and rotated to the horizontal position  without difficulty.  The superior limbal wound was closed with interrupted  10-0 nylon sutures.  Care was taken to protect and to prevent iris prolapse.  The superior iris did prolapse and the portions of the iris were removed in  a peripheral iridectomy fashion.   At this time the superior limbal wound was closed.  The wound was checked  and found to be secure and watertight.  Miochol was used to deepen the  anterior chamber further and to constrict the pupil.  This was carried out  without  difficulty.  The conjunctival peritomy was then closed with 7-0 Vicryl.  Subconjunctival injection of antibiotic steroid applied.  Patient tolerated  the procedure without complication.  She  had a sterile patch, Fox shield  applied in the OR, taken to the Short Stay Area to be discharged home as an  outpatient.      Alford Highland Rankin, M.D.  Electronically Signed     GAR/MEDQ  D:  07/04/2005  T:  07/04/2005  Job:  [id number]

## 2011-04-22 NOTE — Discharge Summary (Signed)
[name], [name]                   ACCOUNT NO.:  [id number]   MEDICAL RECORD NO.:  [id number]          PATIENT TYPE:  INP   LOCATION:  1401                         FACILITY:  [facility]   PHYSICIAN:  Lonzo Cloud. Kriste Basque, M.D. [facility] OF BIRTH:  Oct 19, 1911   DATE OF ADMISSION:  08/17/2005  DATE OF DISCHARGE:  08/21/2005                                 DISCHARGE SUMMARY   FINAL DIAGNOSES:  1.  Admitted August 18, 2005 by Dr. Sung Amabile in my absence with failure to      thrive at home and multiple medical complaints. She is 77 years old. She      has been living on her own and caring for herself but had progressive      weakness and felt she could not make it any longer and was therefore      admitted for further evaluation. She was seen by orthopedics, physical      therapy, and the Care Management Team. Arrangements made for full-time      care at home by the family and visiting nurses. The patient discharged      improved.  2.  Hypertension-controlled on medications.  3.  History of chronic atrial fibrillation on Coumadin followed Dr. Samule Ohm.  4.  History of hypercholesterolemia on Altocor.  5.  History of venous insufficiency and chronic edema on Lasix.  6.  History of hiatus hernia.  7.  Gastroesophageal reflux disease and a previous duodenal ulcer treated      with Prilosec.  8.  History of urinary tract infection in the past.  9.  Status post hysterectomy and bladder surgery.  10. Known degenerative arthritis with previous right total knee replacement      in 1990 and need for left total knee surgery per orthopedics.  11. History of anxiety.  12. Diagnosed with macular degeneration by Dr. Luciana Axe; had a right eye      surgery this summer.  13. History of dizziness due to labyrinthitis treated with a Antivert and      Valium.   HISTORY:  The patient is a 77 year old white female who presented to the  emergency room on  August 17, 2005 with failure to thrive at home. She  has been  fortunate in being able to live independently for a long time. She  has atrial fibrillation and had been on Coumadin. Over the several months  prior to admission, she has slow progressive decline in function. She was  brought to the emergency room with a two-day history of increasing weakness,  low back pain and question of fever. She denied fall or trauma. She denied  any upper respiratory tract infection. She denied chest discomfort or  shortness of breath. She was admitted for further evaluation and treatment.   PAST MEDICAL HISTORY:  1.  As noted, the patient has a history of hypertension controlled on      Lotrel, atenolol and Lasix.  2.  She has chronic atrial fibrillation has been on Coumadin followed Dr.      Samule Ohm and [facility]. She had a  2-D echocardiogram earlier      this year showing biatrial large,  normal LV function, mild mitral      regurgitation or aortic valve sclerosis.  3.  She has a history hypercholesterolemia on Altocor 60 milligrams a day      with cholesterol 190, HDL 47, LDL 112 this summer.  4.  She has venous insufficiency and edema which she controls with Lasix.  5.  She has a history of hiatus hernia, gastroesophageal reflux disease and      previous duodenal ulcer in 2000. Her last endoscopy in 2002 by Dr. Marina Goodell      showed some esophagitis and small ulcer. She remains on Prilosec. She      has not had a previous colonoscopy and refuses this.  6.  She has a history of urinary tract infection in the past but normal      renal function. She has had a previous hysterectomy 1978 and bladder      surgery.  7.  She has known degenerative arthritis with previous right total knee      replacement in 1990 and is said to need a total left knee replacement as      well.  8.  She has known hammertoes with evaluation Dr. Lestine Box in the past and      treated with orthopedic shoes.  9.  She has moderate anxiety and takes lorazepam.  10. She has a history of  macular degeneration and had right eye surgery this      summer per Dr. Luciana Axe.  11. She developed dizziness and was seen in the emergency room recently with      a negative CT brain scan diagnosed labyrinthitis treatment with Antivert      and Valium.   PHYSICAL EXAMINATION:  Examination at the time of admission revealed an  elderly white female in no acute distress. Blood pressure 112/66, pulse 92  and irregular, respirations 18 per minute and not labored, O2 sat 96% on  room air. Temperature 99 degrees. HEENT exam unremarkable. Neck exam was  supple with no adenopathy, jugular distension or bruits. Chest exam revealed  mild thoracic kyphosis, clear without wheezes or rales noted. Cardiac exam  revealed irregular tachycardia. No murmurs detected. The abdomen was soft  and nontender without evidence organomegaly or masses. Extremities showed  some venous insufficiency and 1+ edema. Neurologic exam showed no focal  deficits. Skin exam was negative.   LABORATORY DATA:  EKG showed atrial fibrillation with rapid ventricular  response, nonspecific ST-T wave changes. No acute abnormalities. Chest x-ray  showed no acute cardiopulmonary findings, stable appearance of the chest  from July. Recent CT of the brain showed no acute abnormalities. Abdominal  ultrasound revealed overlying bowel gas, gallbladder was suboptimally  dilated with prominent wall but no stones. MRI of lumbar spine showed  multilevel degenerative changes, 2 mm degenerative slip at L4-5. No evidence  of neural encroachment or spinal stenosis. Hemoglobin 14.3, hematocrit 41.1,  white count 10,700 with 69% segs. Pro time 28.0, INR 2.6. Sodium 135,  potassium 4.0, chloride 100, CO2 27, BUN 32, creatinine 1.4 (repeat 1.1),  blood sugar 135, calcium 8.9, total protein 6.7, albumin 3.1, AST 77, ALT  50, alkaline phosphate 87, total bilirubin 0.9. Hemoglobin A1c 6.2.  Cholesterol 138, triglycerides 127, HDL 43, LDL 70. Urinalysis  clear.   HOSPITAL COURSE:  The patient was admitted by Dr. Sung Amabile in my absence with  failure to thrive. He continued her oral medications,  put her to bedrest and  ordered a orthopedic consultation. They did the MRI of the spine with  degenerative disk disease noted. They recommended physical therapy and this  was arranged. She also had mildly elevated liver enzymes on admission. These  were repeated and showed improvement. Ultrasound showed some thickening of  the gallbladder wall but no stones. The patient had no nausea, vomiting,  etc. The patient was placed on muscle relaxers and pain medication with  improvement. Orthopedic, Dr. Charlann Boxer, recommended conservative management.  Social Services consulted for discharge planning needs. The patient was  arranged have a lift chair, visiting nurses, home PT, etc. All DME and home  care needs being met. The patient was felt to be MHB and ready for discharge  on August 21, 2005.   MEDICATIONS AT DISCHARGE:  1.  Coumadin 5 milligrams per day between 4 and 5 p.m. as before.  2.  Lotrel 5/20 with 1 tablet p.o. q.d.  3.  Atenolol 25 milligrams p.o. b.i.d.  4.  Lasix 40 milligrams p.o. q.a.m.  5.  Altocor 60 milligrams p.o. q.h.s.  6.  Prilosec 20 milligrams p.o. q.d.  7.  Vicodin 1 tablet every 6 hours as needed for pain.  8.  Darvocet 1 tablet every 6 hours as needed for mild pain.  9.  Robaxin 1 tablet every 6-8 hours as needed for muscle spasm.  10. Antivert 25 milligrams tablets one-half to one tablet p.o. q.4h. as      needed for dizziness.  11. Etodolac 400 milligrams p.o. q.12h. as needed for arthritic pain.   The patient was instructed to rest at home. Follow the directions of the  visiting nurses and the home therapist. Will follow up with Dr. Darrelyn Hillock and  myself in one to two weeks.   CONDITION ON DISCHARGE:  Improved.      Lonzo Cloud. Kriste Basque, M.D. [facility]  Electronically Signed     SMN/MEDQ  D:  10/05/2005  T:  10/06/2005  Job:   [id number]

## 2011-04-22 NOTE — Discharge Summary (Signed)
[name], [name]                   ACCOUNT NO.:  [id number]   MEDICAL RECORD NO.:  [id number]          PATIENT TYPE:  INP   LOCATION:  6704                         FACILITY:  [facility]   PHYSICIAN:  Lonzo Cloud. Kriste Basque, MD     DATE OF BIRTH:  09-May-1911   DATE OF ADMISSION:  08/24/2006  DATE OF DISCHARGE:  08/31/2006                                 DISCHARGE SUMMARY   FINAL DIAGNOSES:  1. Admitted August 24, 2006, with a right-sided flank and abdominal      pain and urinary tract infection - probable pyelonephritis - blood      cultures negative x2 and urine culture grew only 9000 colonies.  The      patient responded to IV Cipro with improvement was switched to oral      Cipro prior to discharge.  2. Congestive heart failure - secondary to volume overload and responded      to Lasix.  3. History of hypertension - controlled on medications.  4. History of chronic atrial fibrillation and patient elected to stop      Coumadin.  Hypercoagulable panel this admission was negative.  5. History of hypercholesterolemia - controlled with Altocor.  6. History of a chronic venous insufficiency and edema - the patient takes      Lasix for this.  7. Previous hospitalization September 2006 for failure to thrive - please      see that discharge summary for details, the patient is 77 years old.  8. History of hiatus hernia, gastroesophageal reflux disease and previous      duodenal ulcer.  She takes of proton pump inhibitors, she has refused      colonoscopies in the past.  9. Status post hysterectomy and bladder surgery.  10.History of degenerative arthritis with previous right total knee in      1990, she has moderate severe arthritis in the left knee and was told      she needed a left total knee procedure by orthopedics.  11.History of anxiety.  12.History of macular degeneration - followed by Dr. Luciana Axe.  13.History of dizziness and labyrinthitis in the past.  14.Right upper lobe nodule with  needle biopsy compatible with      granulomatous inflammation, the CT scan this admission showed      decreasing size of the lesion.  15.Status post cholecystectomy in June 2007.   BRIEF HISTORY AND PHYSICAL:  The patient is a 77 year old  white female  known to me with multiple medical problems as noted above.  She presented to  the emergency room on August 24, 2006, with right-sided chest, flank and  abdominal discomfort of 2 weeks duration.  The pain was intermittent but  suddenly worsened on the day of admission.  It was worse with palpation and  movement.  Seemed to be worse with a deep breath.  She had poor intake over  several days prior to admission.  Evaluation had revealed urinary tract  infection with abnormal urinalysis and low grade fever.  She had some CVA  tenderness on exam.  She  was admitted with probable pyelonephritis for IV  antibiotics and further evaluation.   PAST MEDICAL HISTORY:  1. The last hospitalization in June 2007 was for cholecystectomy.  2. She was hospitalized in September 2006 with failure to thrive, and the      daughter notes that she frets continually.  3. She has a history of hypertension controlled on medications.  4. She has a history of chronic atrial fibrillation and was previously on      Coumadin but this was discontinued last summer.  5. She has a history of a hypercholesterolemia and takes Occupational psychologist.  6. She has venous insufficiency and chronic edema and is on Lasix.  7. She has a history of hiatus hernia, gastroesophageal reflux disease and      previous duodenal ulcer for which she takes proton pump inhibitors      regularly.  She has consistently refused colonoscopies in the past.  8. She has history of urinary tract infections previously.  9. Hysterectomy and bladder surgery in the past.  10.She has known degenerative arthritis with previous right total knee      operation in 1990.  She states she was told she needs a left total  knee      as well.  11.She has a history of anxiety.  12.She has known macular degeneration and sees Dr. Luciana Axe.  13.She has a history of dizziness and labyrinthitis.  14.She had a right upper lobe nodule on chest x-ray with previous      evaluation including needle biopsy that confirmed a granuloma.   PHYSICAL EXAMINATION:  Physical examination at the time of admission  revealed a 77 year old white female, chronically ill-appearing but in no  acute distress.  Blood pressure 134/90, pulse 100 and irregular,  respirations 20 per minute and not labored, temperature 101, O2 sat 93% on  room air.  HEENT exam revealed dry mucous membranes otherwise negative.  Chest exam revealed decreased breath sounds, no wheezes, rales or use of  sensory muscles.  Cardiac exam revealed irregular rhythm, no murmurs, rubs  or gallops detected.  The abdomen was soft but there was some right upper  quadrant and right flank tenderness on palpation and percussion.  Intact  bowel sounds, no evidence organomegaly.  Stool was negative for blood.  Extremities showed venous insufficiency, trace edema.  Neurologic exam was  normal with no focal abnormalities detected.  Skin was negative with no  rash.   LABORATORY DATA:  EKG showed atrial fibrillation, nonspecific ST-T wave  changes, ventricle response was controlled and this was confirmed on her  monitor.   Chest x-ray showed heart at the upper limits of normal in size.  No signs of  congestive heart failure, bibasilar atelectasis, could not rule out small  effusion.  Abdominal ultrasound showed no acute abnormalities, slight  increased echogenicity of the renal parenchyma was noted.  Previous  cholecystectomy.  CT scan of the chest showed right upper lobe nodule  measuring 8 x 14 mm, it is slightly smaller than when previously noted.  There were no other masses.  Minimal bibasilar atelectasis noted.  CT of the abdomen and pelvis showed atheromatous disease of the  aorta, no acute  findings - see dictation for details.   Hemoglobin 14.7, hematocrit 42.9, white count 14,200 with 79% segs.  Serial  CBCs were followed throughout her hospital course.  Sed rate 40.  Hypercoagulable panel was negative with normal antithrombin III levels,  negative for factor V Leiden, negative lupus anticoagulant,  normal protein C  and S levels, normal homocystine.  Sodium 135, potassium 3.5, chloride 99,  CO2 25, BUN 25, creatinine 1.4, blood sugar 167, repeat 103, calcium 8.6,  total protein 6.3, albumin 3.3, AST 25, ALT 16, alkaline phosphatase 79,  total bilirubin 0.9, phosphorus 3.5, lipase 27.  CPK 46 with negative MB and  negative troponin.  Beta natriuretic peptide elevated at 714.  Urinalysis  showed positive nitrite and leukocyte esterase test with bacteria and white  cells present.  Blood cultures negative x2.  Unfortunately, urine culture  only grew 9000 colonies not identified.   HOSPITAL COURSE:  The patient was admitted by Dr. Delton Coombes and Anders Simmonds in  my absence.  She was started IV Cipro for presumed pyelonephritis.  She was  continued on her home meds and given Lovenox subcu.  Workup as above.  We  proceeded with CT of chest, abdomen and pelvis with no acute abnormalities  identified.  She responded to the IV Cipro and was switched to oral Cipro  and remained afebrile and white count improved, etc.  Prior to planned  discharge on August 29, 2006, she developed increasing shortness of  breath.  She had some rales at the bases.  Chest x-ray showed small pleural  effusions and she was clearly volume overloaded with a BNP of 714.  Her  Lasix had been held on admission, it was restarted at that point.  She was  given nebulizer treatments with Xopenex and improved steadily.  She was felt  to be MHB and ready for discharge on August 31, 2006.   MEDICATIONS AT DISCHARGE:  1. Cipro 250 mg 1 tablet b.i.d. till gone.  2. Atenolol 25 mg p.o. daily.  3.  Lasix 40 mg p.o. q.a.m.  4. Prilosec 20 mg p.o. daily.  5. Altocor 60 mg p.o. nightly.  6. Multivitamin daily.  7. Darvocet as needed for pain.  8. Antivert as needed for dizziness.  9. Enteric-coated aspirin one daily.   The patient will follow up in the office in October as planned and call for  any problems.   CONDITION ON DISCHARGE:  Improved.      Lonzo Cloud. Kriste Basque, MD  Electronically Signed     SMN/MEDQ  D:  08/31/2006  T:  09/01/2006  Job:  [id number]

## 2011-04-22 NOTE — Consult Note (Signed)
[name], [name]                   ACCOUNT NO.:  [id number]   MEDICAL RECORD NO.:  [id number]          PATIENT TYPE:  EMS   LOCATION:  MAJO                         FACILITY:  [facility]   PHYSICIAN:  Sandria Bales. Ezzard Standing, M.D.  DATE OF BIRTH:  [birth year]   DATE OF CONSULTATION:  05/30/2006  DATE OF DISCHARGE:                                   CONSULTATION   REASON FOR CONSULTATION:  Gallbladder disease.   HISTORY OF PRESENT ILLNESS:  This is a 77 year old white female, patient of  Dr. Lonzo Cloud. Kriste Basque, who presently acutely by ambulance to the [facility] Emergency Room today with kind of bilateral chest pain and  epigastric pain.  She was seen initially by Dr. Bruce Donath who was worried  about a priimary chest disease.  He ordered a CT scan to rule out PE.  The  CT scan showed a resolving left upper lung mass Abundio Miu they can compare  this to CT scans done in January, 2007]  and a funny looking gallbladder.  She subsequently had a gallbladder ultrasound which shows a thickened  gallbladder wall which is contracted.  No Murphy's sign.   So the feeling is that she may be having  biliary disease.  She said it may  have been hurting on and off for several weeks but she not spoken up to Dr.  Kriste Basque about this problem and only presented acutely today to the [facility]  ER.   She has a remote history of ulcer disease.  Apparently had a diagnosis of  gastroesophageal reflux disease.  She has had no liver disease, pancreatic  disease or colon disease.  Her only prior abdominal surgery was a vaginal  hysterectomy in the 1960s.   ALLERGIES:  She has no known allergies.   MEDICATIONS:  1.  Atenolol 25 mg b.i.d.  2.  Coumadin 2.5 mg daily.  3.  Lasix 40 mg daily.  4.  She alternates between Darvocet and Vicodin and this again is for      chronic arthritic pains.  5.  She takes Prilosec daily.  6.  Altoprev 60 mg daily.   REVIEW OF SYSTEMS:  NEUROLOGICAL:  She has no history of seizure  or loss of  consciousness.  PULMONARY:  She had this left upper lobe nodule.  It looks like it was  worked up in January of 2007 which by biopsy seemed to be a granulomatous  disease so this was considered benign.  Actually on today's CT, this mass  looked less impressive than in the January CT.  HEART:  She has had atrial fibrillation.  This was diagnosed in November  2006 when she was admitted with failure to thrive.  She has been on Coumadin  since that time.  She has never had a heart catheterization.  GASTROINTESTINAL:  See history of present illness.  UROLOGIC:  No history of kidney stones or kidney infections.  She is  accompanied by her two daughters, Terance Ice and Cheryl Flash, and she  actually lives at home with some assistance.   PHYSICAL  EXAMINATION:  VITAL SIGNS:  Temperature is 98.8, pulse 124 and  irregular, respirations 24, blood pressure 149/93.  GENERAL:  She is a well-nourished, pleasant older white female.  Alert and  cooperative on physical exam.  HEENT:  Unremarkable.  NECK:  Supple.  I feel no mass or thyromegaly.  LUNGS:  Clear to auscultation.  HEART:  Irregular rate and rhythm and is somewhat tachy consistent with  atrial fibrillation.  BREASTS:  Symmetrical without clear mass.  ABDOMEN:  Mildly distended.  She really does not have any real guarding, no  Murphy's sign and no tenderness on my exam right now.  RECTAL:  I did not do a rectal exam on her.  She has no abdominal scars.  EXTREMITIES:  She has good strength in the upper and lower extremities.  NEUROLOGICAL:  Grossly intact.   LABORATORY DATA:  White blood count of 12,000, hemoglobin is 15.3,  hematocrit 45.1, platelet count of 183,000.  Her PT is 22.8 with an INR of  2.  Her sodium is 138, potassium 4.5, chloride of 97, creatinine of 1.2,  alkaline phosphate of 101, total bilirubin of 1.0, lipase of 28.   I have reviewed her CT with Dr. Jeronimo Greaves.  The CT scan shows a resolving  left upper  lobe nodular mass though she did have a funny kind of  gallbladder.  It was actually abnormal on CT scan done in January.  The  ultrasound does show gallbladder with stones which is thickened wall and  contracted.   DIAGNOSES:  1.  Biliary disease:  In the face of her presentation, she is probably best      kept in the hospital with  anticoagulation reversed and then consider a      cholecystectomy.  I discussed with the family with laparoscopic and open      cholecystectomy to assess her risks which include bleeding, infection,      common bile duct injury and the possibility of open surgery.  2.  Atrial fibrillation:  With the on and off rapid ventricular rate right      now.  3.  Anticoagulation for the atrial fibrillation.  4.  Hypercholesterolemia.  5.  Gastroesophageal reflux disease with a remote history of ulcer disease.  6.  Degenerative arthritis of the back and knees of which she is on chronic      pain medicine and does limit her physical activity, requires walker.  7.  Decreased hearing.      Sandria Bales. Ezzard Standing, M.D.  Electronically Signed     DHN/MEDQ  D:  05/30/2006  T:  05/30/2006  Job:  [id number]   cc:   Lonzo Cloud. Kriste Basque, M.D. [facility]  [address]  [place]  [place] [id number]

## 2011-04-22 NOTE — H&P (Signed)
[name], [name]                   ACCOUNT NO.:  [id number]   MEDICAL RECORD NO.:  [id number]          PATIENT TYPE:  INP   LOCATION:  1401                         FACILITY:  [facility]   PHYSICIAN:  Oley Balm. Sung Amabile, M.D. [facility] OF BIRTH:  [birth year]   DATE OF ADMISSION:  08/17/2005  DATE OF DISCHARGE:                                HISTORY & PHYSICAL   ADMISSION DIAGNOSES:  1.  Chronic atrial fibrillation.  2.  Low back pain.  3.  Adult failure-to-thrive.  4.  Isolated fever.   HISTORY OF PRESENT ILLNESS:  [name] is a 77 year old woman who is a  patient of Dr. Kriste Basque. Overall, she is extremely healthy given her advanced  age. She lives independently. She was diagnosed with atrial fibrillation  several months ago and warfarin was started for this. Over the past couple  of months she has had progressive deterioration in her level of function.  She is admitted from the emergency department today after 2 days of profound  weakness, severe low back pain, and subjective fever. The low back pain is  located over the sacroiliac crest on the right side. She denies sinus  symptoms and sore throat. No neck stiffness or headache. No chest discomfort  or shortness of breath. No cough or sputum production. No nausea, vomiting,  diarrhea or dysuria. Review of systems is only positive for the symptoms  described above, and lower extremity edema which is chronic.   PAST MEDICAL HISTORY:  1.  Chronic atrial fibrillation.  2.  Hypertension.  3.  Hypercholesterolemia.  4.  Recent ocular hemorrhage on the left, seen by Dr. Luciana Axe.  5.  Vertigo - recently evaluated the emergency department on August 13, 2005 and prescribed Valium 5 mg p.o. q.i.d. p.r.n.   SOCIAL HISTORY:  Never smoked, no history of occupational exposures.   FAMILY HISTORY:  Noncontributory.   REVIEW OF SYSTEMS:  As per the history of present illness and otherwise  negative.   PHYSICAL EXAMINATION:  VITAL SIGNS:   Temperature 99.4, blood pressure  113/67, pulse 92-115 and irregular, respirations 16 and unlabored, room air  oxygen saturation is 96%.  GENERAL:  She is well-developed, well-nourished, in no acute cardiac or  respiratory distress.  HEENT:  Reveals no acute abnormalities.  NECK:  Supple without adenopathy, jugular venous pulsations are not  visualized.  CHEST:  Examination reveals mild thoracic kyphosis, percussion note is  normal. Breath sounds are mildly diminished. There are faint bilateral  basilar crackles. No wheezes are noted.  CARDIAC:  Exam reveals a tachycardia with an irregularly irregular rhythm  and no murmurs noted.  ABDOMEN:  Moderately obese, soft, nontender, with normal bowel sounds.  EXTREMITIES:  Reveal 1+ bilateral symmetric edema.  NEUROLOGIC:  Exam reveals no focal deficits.   DATA:  Chest x-ray reveals no acute cardiac or pulmonary disease. EKG  reveals atrial fibrillation with a rate of 101. There are diffuse  nonspecific ST and T-wave abnormalities which do not appear ischemic.  Laboratory data including a CBC was essentially unremarkable. Specifically,  the  microscopic urine reveals less than 2 white blood cells per high-power  field. Her prothrombin time is 28 seconds which is an INR of 2.6.  Chemistries reveal only minimal abnormalities. None of these abnormalities  seem relevant to the acute illness.. CBC reveals minimally elevated white  blood cell count of 10,700 with no left shift.   IMPRESSION:  1.  Advanced age.  2.  Chronic atrial fibrillation - rate reasonably well controlled at rest.  3.  Severe low back pain.  4.  Adult failure-to-thrive over the past couple of months.  5.  Profound generalized weakness over the past couple days.  6.  Isolated fever at home without a clear infectious etiology.   PLAN:  She will be admitted to the hospital for observation. I am not going  to start any antibiotics at the present time. I will continue her  medical  regimen as outlined on the accompanying documentation and as has been  reviewed with the patient's family. In addition, I will offer Percocet as  needed for pain. On the day following this admission, Dr. Kriste Basque will assess  what further evaluation needs to be done with regard to the low back pain. I  did discuss with the patient and her family their desire to reduce the  number of medications that she takes if possible. This can be discussed  further with Dr. Kriste Basque.           ______________________________  Oley Balm. Sung Amabile, M.D. [facility]     DBS/MEDQ  D:  08/18/2005  T:  08/18/2005  Job:  [id number]

## 2011-04-22 NOTE — Op Note (Signed)
NAMECAMARA, [name]                   ACCOUNT NO.:  [id number]   MEDICAL RECORD NO.:  [id number]          PATIENT TYPE:  INP   LOCATION:  3732                         FACILITY:  [facility]   PHYSICIAN:  Wilmon Arms. Corliss Skains, M.D. DATE OF BIRTH:  18-Jun-1911   DATE OF PROCEDURE:  06/02/2006  DATE OF DISCHARGE:                                 OPERATIVE REPORT   PREOPERATIVE DIAGNOSIS:  Chronic cholecystitis   POSTOPERATIVE DIAGNOSIS:  Chronic cholecystitis   PROCEDURE PERFORMED:  Laparoscopic cholecystectomy, with intraoperative  cholangiogram.   SURGEON:  Wilmon Arms. Corliss Skains, M.D.   ASSISTANT:  Gabrielle Dare. Janee Morn, M.D.   ANESTHESIA:  General endotracheal.   INDICATIONS:  The patient is a 77 year old female who presented with  epigastric pain.  She had a CT scan to rule out PE.  This showed an  incidental finding of an abnormal-appearing gallbladder.  Gallbladder  ultrasound showed a thickened gallbladder wall and a contracted gallbladder.  There was no Murphy's sign.  The patient has had some chronic right upper  quadrant pain.  She chronically is anticoagulated for atrial fibrillation.  Her INR has been allowed to drift back down close a normal of the last  couple of days while she has been receiving antibiotics.   DESCRIPTION OF PROCEDURE:  The patient was brought to the operating room and  placed in the supine position on the operating table.  After an adequate  level of general endotracheal anesthesia was obtained, the patient's abdomen  was prepped with Betadine and draped in sterile fashion.  A time-out was  taken to ensure the proper patient and proper procedure.  The area below her  umbilicus was infiltrated with 0.25% percent Marcaine.  A transverse  incision was made, and dissection was carried down to the fascia.  The  fascia was opened, and the peritoneal cavity was bluntly entered.  A  pursestring suture of 0 Vicryl as placed around the fascial opening.  The  Hasson cannula was  inserted and secured with a stay suture.  Pneumoperitoneum was obtained by insufflating CO2, maintaining maximal  pressure of 15 mmHg.  The patient was rotated to a reverse Trendelenburg  position and tilted slightly to her left.  A thickened gallbladder was  immediately visualized, and this was adherent to the anterior abdominal  wall.  A 10 mm port was placed in the subxiphoid position.  The patient had  some omental adhesions to the anterior abdominal wall.  These were taken  down with cautery.  Two 5 mm ports were placed in the right upper quadrant.  The gallbladder was then grasped and clamped after dissecting it free from  the anterior abdominal wall.  The peritoneum around the hilum of the  gallbladder was opened.  This peritoneum was very thick and inflamed.  We  were able to identify a cystic duct.  This was ligated with a clipped, and a  small opening was created on the cystic duct.  A Cook cholangiogram catheter  was inserted through a separate stab incision and threaded into the cystic  duct.  It was  secured with a clip, and a cholangiogram was then obtained.  The cholangiogram showed good flow proximally and distally in the biliary  tree.  There was a very tight ampulla, but contrast flowed readily into the  duodenum.  There was no evidence of filling defect.  The cholangiogram  catheter was then removed, and the cystic duct was ligated with clips and  divided.  The cystic artery was then identified coming off of a very large  artery.  The cystic artery branch was ligated with clips and divided.  Cautery was then used to remove the gallbladder from the liver bed.  The  gallbladder was placed in an EndoCatch sac.  The gallbladder fossa was then  thoroughly irrigated.  Cautery was used for hemostasis.  A piece of Surgicel  was placed in the gallbladder fossa.  The irrigant was suctioned out.  The  gallbladder was then removed in an EndoCatch sac through the umbilical port  site.   Pneumoperitoneum was then released as the remainder of the ports were  removed.  4-0 Monocryl was used to close the skin incisions in a  subcuticular fashion.  Steri-Strips and clean dressings were applied.  The  patient was explained and brought to the recovery room in stable condition.  All sponge, instrument, and needle counts were correct.      Wilmon Arms. Tsuei, M.D.  Electronically Signed     MKT/MEDQ  D:  06/02/2006  T:  06/02/2006  Job:  [id number]   cc:   Lonzo Cloud. Kriste Basque, M.D. [facility]  [address]  [place]  [place] [address]

## 2011-04-22 NOTE — Discharge Summary (Signed)
[name], [name]                   ACCOUNT NO.:  [id number]   MEDICAL RECORD NO.:  [id number]          PATIENT TYPE:  INP   LOCATION:  3732                         FACILITY:  [facility]   PHYSICIAN:  Lonzo Cloud. Kriste Basque, M.D. [facility] OF BIRTH:  [birth year]   DATE OF ADMISSION:  05/30/2006  DATE OF DISCHARGE:  06/06/2006                                 DISCHARGE SUMMARY   HISTORY:  The patient is a 77 year old white female known to be with  multiple medical problems.  She presented to the emergency room on May 30, 2006, with a several week history of gradual increase right upper quadrant  pain and mild nausea.  On the day of admission, the pain increased and  became constant and she presented to emergency room for evaluation.  In the  ER, she was seen by the emergency room physician, referred to Dr. Ovidio Kin for treatment of acute cholecystitis and probable gallbladder  surgery.  Sonar had revealed a thickened gallbladder wall, questioned  stones.  Dr. Ezzard Standing deferred the admission to me for medical clearance and  stabilization prior to planned cholecystectomy.   PAST MEDICAL HISTORY:  1.  The patient was last hospitalized in September 2006, for failure to      thrive - please see that discharge summary for details.  2.  She has a history of hypertension, controlled on medications.  3.  She has a history of chronic atrial fibrillation for which she has been      taking Coumadin.  4.  She has a history of hypercholesterolemia and is on Altocor.  5.  She has a history of venous insufficiency and chronic edema and takes      Lasix for this.  6.  She has a history of hiatus hernia, gastroesophageal reflux disease, and      previous duodenal ulcer.  She takes proton pump inhibitor therapy.  She      has refused colonoscopies in the past.  7.  She has a history urinary tract infections and had previous hysterectomy      and bladder surgery.  8.  She has a history of degenerative arthritis  and a previous right total      knee operation in 1990, and is said to need a left total knee operation      for orthopedics.  9.  She has a history of anxiety.  10. She has a history of macular degeneration, sees Dr. Luciana Axe, with      previous right eye surgery.  11. She has a history of dizziness and labyrinthitis in the past.  12. As noted, she was hospitalized in September 2006, with failure to thrive      and weakness, treated with physical therapy, etc.  13. She has a known right upper lobe nodule with a needle biopsy that showed      granulomatous inflammation earlier this year.   HOME MEDICATIONS:  Include atenolol, Lasix, Coumadin, and pain meds Darvocet  and Vicodin.   PHYSICAL EXAMINATION:  GENERAL:  Revealed an elderly white female in no  acute distress and  chronically ill-appearing.  She is 77 years old.  VITAL SIGNS:  Blood pressure 150/90, pulse 110 and irregular, respirations  20 per minute - not labored, temperature 98 degrees.  HEENT:  Unremarkable.  Could not see the fundi well.  NECK:  Supple with no jugular distension, no carotid bruits, no thyromegaly  or lymphadenopathy.  CHEST:  Clear to percussion, auscultation.  CARDIAC:  Revealed irregular rhythm, grade 1/6 systolic ejection murmurs  left sternal border.  No rubs or gallops heard.  ABDOMEN:  Tender in the right upper quadrant without masses or evidence of  organomegaly.  Bowel sounds were intact.  RECTAL:  Deferred.  EXTREMITIES:  Showed no cyanosis or clubbing.  There was a trace of edema.  NEUROLOGIC:  Intact without focal abnormalities detected.   LABORATORY DATA:  EKG showed atrial fibrillation, nonspecific ST-T wave  changes, no acute abnormalities.  Chest x-ray showed thoracic kyphosis and  osteopenia.  Heart to the upper limits normal in size.  Abdominal series  showed chronic changes to the lung bases and borderline cardiomegaly, no  acute abnormality in the abdomen.  CT angio of the chest showed  no evidence  of pulmonary emboli; the right upper lobe nodule had decreased in size from  January 2007; no acute abnormalities.  Abdominal ultrasound showed a  contracted gallbladder with suspicious intraluminal stones and thickened  wall.   Hemoglobin 15.3, hematocrit 45.1, white count 12,000 with 76% segs.  Final  hemoglobin 12.4, white count 5300.  Sed rate 35.  Protime 22.8, INR 2.0, PTT  39 seconds.  Sodium 138, potassium 4.5, chloride 97, CO2 31, BUN 16,  creatinine 1.2, blood sugar 157 - repeat 94, calcium 9.6.  Total protein  7.7, albumin 3.8, AST 28, ALT 18, alk phos 101, total bilirubin 1.0, lipase  28.  CPK 79, negative MB, and negative troponin.  Beta natriuretic peptide  232.  TSH 1.22.  Urinalysis clear.  Culture negative.  Path specimen showed  gallbladder with chronic cholecystitis and cholelithiasis.   HOSPITAL COURSE:  The patient's Coumadin was held in anticipation of needed  cholecystectomy.  Her protime drifted down and she was given Zofran as  needed for nausea.  She underwent laparoscopic cholecystectomy by Dr. Corliss Skains  on June 02, 2006.  He found a chronic cholecystitis and cholelithiasis.  The  patient had an uneventful postoperative course.  She was covered with  Lovenox as a bridge for anticoagulation.  In the postoperative course, the  patient and her family came to the decision that they wished to discontinue  her Coumadin therapy for atrial fibrillation.  She was placed on a baby  aspirin daily and felt to be MHB and ready for discharge on June 06, 2006.   MEDICATIONS AT DISCHARGE:  1.  Atenolol 25 mg p.o. b.i.d.  2.  Lasix 40 mg p.o. q.a.m.  3.  Altocor 60 mg p.o. q.h.s.  4.  Darvocet or Vicodin, 1 every 6 hours as needed for pain.  5.  Prilosec 20 mg p.o. every day.  6.  Tylenol as needed.  7.  Antivert 25 mg, 1/2 to 1 tablet every 4 hours as needed for dizziness.  8.  The patient was instructed to stop her previous Coumadin, per her wishes     and take  a buffered 81 mg aspirin daily.   DISPOSITION:  Patient being discharged.  Follow with Dr. Kriste Basque in the office  on Wednesday, 14 June 2006 at 1:45 p.m..   CONDITION ON  DISCHARGE:  Improved.      Lonzo Cloud. Kriste Basque, M.D. [facility]  Electronically Signed     SMN/MEDQ  D:  06/07/2006  T:  06/07/2006  Job:  [id number]   cc:   Wilmon Arms. Corliss Skains, M.D.  [address] [place] [address] [id number]  [place]

## 2011-04-23 ENCOUNTER — Inpatient Hospital Stay (HOSPITAL_COMMUNITY): Payer: Medicare Other

## 2011-04-23 LAB — CBC
HCT: 42.1 % (ref 36.0–46.0)
Hemoglobin: 14.1 g/dL (ref 12.0–15.0)
MCHC: 33.5 g/dL (ref 30.0–36.0)
RBC: 4.62 MIL/uL (ref 3.87–5.11)

## 2011-04-23 LAB — COMPREHENSIVE METABOLIC PANEL
AST: 33 U/L (ref 0–37)
Albumin: 3.1 g/dL — ABNORMAL LOW (ref 3.5–5.2)
Chloride: 93 mEq/L — ABNORMAL LOW (ref 96–112)
Creatinine, Ser: 1.33 mg/dL — ABNORMAL HIGH (ref 0.4–1.2)
GFR calc Af Amer: 45 mL/min — ABNORMAL LOW (ref >60)
Potassium: 3.9 mEq/L (ref 3.5–5.1)
Total Bilirubin: 0.6 mg/dL (ref 0.3–1.2)

## 2011-04-23 LAB — DIFFERENTIAL
Basophils Absolute: 0 10*3/uL (ref 0.0–0.1)
Lymphocytes Relative: 21 % (ref 12–46)
Monocytes Absolute: 1.3 10*3/uL — ABNORMAL HIGH (ref 0.1–1.0)
Monocytes Relative: 15 % — ABNORMAL HIGH (ref 3–12)
Neutro Abs: 5.6 10*3/uL (ref 1.7–7.7)

## 2011-04-23 LAB — GLUCOSE, CAPILLARY
Glucose-Capillary: 263 mg/dL — ABNORMAL HIGH (ref 70–99)
Glucose-Capillary: 241 mg/dL — ABNORMAL HIGH (ref 70–99)
Glucose-Capillary: 250 mg/dL — ABNORMAL HIGH (ref 70–99)
Glucose-Capillary: 226 mg/dL — ABNORMAL HIGH (ref 70–99)

## 2011-04-24 LAB — DIFFERENTIAL
Basophils Absolute: 0 10*3/uL (ref 0.0–0.1)
Basophils Relative: 0 % (ref 0–1)
Eosinophils Absolute: 0 10*3/uL (ref 0.0–0.7)
Eosinophils Relative: 0 % (ref 0–5)
Lymphocytes Relative: 19 % (ref 12–46)

## 2011-04-24 LAB — CBC
Platelets: 163 10*3/uL (ref 150–400)
RDW: 12.8 % (ref 11.5–15.5)
WBC: 10.7 10*3/uL — ABNORMAL HIGH (ref 4.0–10.5)

## 2011-04-24 LAB — COMPREHENSIVE METABOLIC PANEL
ALT: 17 U/L (ref 0–35)
Albumin: 3 g/dL — ABNORMAL LOW (ref 3.5–5.2)
Alkaline Phosphatase: 72 U/L (ref 39–117)
Calcium: 9.7 mg/dL (ref 8.4–10.5)
GFR calc Af Amer: 38 mL/min — ABNORMAL LOW (ref >60)
Potassium: 4.1 mEq/L (ref 3.5–5.1)
Sodium: 135 mEq/L (ref 135–145)
Total Protein: 7.2 g/dL (ref 6.0–8.3)

## 2011-04-24 LAB — GLUCOSE, CAPILLARY
Glucose-Capillary: 163 mg/dL — ABNORMAL HIGH (ref 70–99)
Glucose-Capillary: 182 mg/dL — ABNORMAL HIGH (ref 70–99)
Glucose-Capillary: 197 mg/dL — ABNORMAL HIGH (ref 70–99)

## 2011-04-25 ENCOUNTER — Inpatient Hospital Stay (HOSPITAL_COMMUNITY): Payer: Medicare Other

## 2011-04-25 LAB — COMPREHENSIVE METABOLIC PANEL
ALT: 20 U/L (ref 0–35)
AST: 26 U/L (ref 0–37)
Albumin: 2.7 g/dL — ABNORMAL LOW (ref 3.5–5.2)
CO2: 30 mEq/L (ref 19–32)
Calcium: 10.2 mg/dL (ref 8.4–10.5)
Creatinine, Ser: 1.63 mg/dL — ABNORMAL HIGH (ref 0.4–1.2)
GFR calc Af Amer: 35 mL/min — ABNORMAL LOW (ref >60)
GFR calc non Af Amer: 29 mL/min — ABNORMAL LOW (ref >60)
Sodium: 132 mEq/L — ABNORMAL LOW (ref 135–145)

## 2011-04-25 LAB — DIFFERENTIAL
Basophils Absolute: 0 10*3/uL (ref 0.0–0.1)
Basophils Relative: 0 % (ref 0–1)
Eosinophils Absolute: 0 10*3/uL (ref 0.0–0.7)
Monocytes Absolute: 1.8 10*3/uL — ABNORMAL HIGH (ref 0.1–1.0)
Neutro Abs: 7.2 10*3/uL (ref 1.7–7.7)
Neutrophils Relative %: 67 % (ref 43–77)

## 2011-04-25 LAB — GLUCOSE, CAPILLARY
Glucose-Capillary: 196 mg/dL — ABNORMAL HIGH (ref 70–99)
Glucose-Capillary: 183 mg/dL — ABNORMAL HIGH (ref 70–99)
Glucose-Capillary: 188 mg/dL — ABNORMAL HIGH (ref 70–99)

## 2011-04-25 LAB — CBC
Hemoglobin: 13.9 g/dL (ref 12.0–15.0)
MCHC: 33.9 g/dL (ref 30.0–36.0)
Platelets: 162 10*3/uL (ref 150–400)
RBC: 4.47 MIL/uL (ref 3.87–5.11)

## 2011-04-25 NOTE — Group Therapy Note (Signed)
[name], [name]                   ACCOUNT NO.:  [id number]  MEDICAL RECORD NO.:  [id number]           PATIENT TYPE:  I  LOCATION:  1401                         [facility]AC[facility]  PHYSICIAN:  Talmage Nap, MD  DATE OF BIRTH:  [birth year]                                PROGRESS NOTE   PRIMARY CARE PHYSICIAN: Lonzo Cloud. Kriste Basque, MD  ADMITTING PHYSICIAN: Houston Siren, MD  DIAGNOSES: 1. Congestive heart failure (acute diastolic dysfunction). 2. Acute bronchitis. 3. Urinary tract infection. 4. Wry neck (torticollis) i.e spasm of     sternocleidomastoid muscle. 5. Atrial fibrillation. 6. Dyslipidemia. 7. Presbycusis (hearing impaired). 8. Gastroesophageal reflux disease. 9. Macular degeneration. 10.Deconditioning. 11.Questionable diabetes mellitus. 12.Pulmonary hypertension.  BRIEF HISTORY: The patient is a 77 year old very pleasant Caucasian female with history of A-Fib, on aspirin, hypertension and hypercholesterolemia, who was admitted to the hospital on Apr 19, 2011, by Dr. Houston Siren with complaints of cough and weakness.  Cough was said to be nonproductive of sputum, with associated shortness of breath and exertional dyspnea.  The patient was also said to have had orthopnea.  She denied any vomiting. She denied any fever, denied any chills.  Denied any rigor.  She was also said  to have increased swelling of the lower extremity and subsequently was brought to the emergency room for evaluation.  PREADMISSION MEDICATIONS: Without dosages, include aspirin, atenolol, Lasix, gabapentin, Glucophage XR, hydrocodone, losartan, meclizine, Ocuvite, Pravachol, Prilosec, Tylenol.  ALLERGIES: NONSTEROIDAL ANTI-INFLAMMATORY DRUGS and VOLTAREN.  SOCIAL HISTORY: Negative for alcohol or tobacco use.  Lives with her daughter.  FAMILY HISTORY: Noncontributory.  REVIEW OF SYSTEMS: Essentially as documented in the initial history and physical.  At the time the patient was seen by the  admitting physician:  Vital Signs: Blood pressure is 179/100, pulse 97, respiratory rate 20, temperature is 99.1.  HEENT:  Pupils were reactive to light.  Neck:  No jugular venous distention.  No carotid bruit.  No lymphadenopathy.  Chest:  Scattered rales and rhonchi.  Heart:  Sounds irregular,  no murmur.  Abdomen: Soft, nontender.  Liver, spleen, kidney not palpable.  Bowel sounds are positive.  Extremities:  +1 pedal edema.  Neurologic exam:  Nonfocal. Musculoskeletal:  Arthritic changes in the knees and in the feet. Neuropsychiatric:  Unremarkable.  LABORATORY DATA: Initial comprehensive metabolic panel showed sodium of 135, potassium of 3.8, chloride of 95 with a bicarb of 28, glucose is 134, BUN is 21, creatinine is 1.08.  Complete blood count with differential showed WBC of 6.6, hemoglobin of 13.7, hematocrit of 40.2, MCV of 91.8 with a platelet count of 156, neutrophils 52%  and monocytes 16%.  Pro BNP 1743.  Urinalysis unremarkable.  Cardiac markers troponin-I less than 0.30, less than 0.30, less than 0.30, and less than 3.30 respectively; CK-MB within normal range.  Magnesium level is 1.5.  A repeat complete blood count with differential done on Apr 20, 2011, showed WBC of 6.7, hemoglobin  13.1, hematocrit of 38.9, MCV of 90.9 with a platelet count of 141.  Basic metabolic panel showed sodium of 133, potassium of 3.5, chloride of 92  with a bicarb of 29, glucose is 127, BUN is 21, creatinine is 1.8.  TSH 0.680, normal.  Urine culture grew Klebsiella pneumoniae and enterococcus species.  A repeat comprehensive metabolic panel done on Apr 25, 2011, showed sodium of 132, potassium of 4.4, chloride of 92 with a bicarb of 30, glucose is 181, BUN is 15, creatinine is 1.63.  Magnesium level is 2.6.  And complete blood count with differential showed WBC of 10.8, hemoglobin of 13.9, hematocrit of 41.0, MCV of 91.7 with a platelet count of 162.  Hemoglobin A1c done in March 2012 was  6.8.  IMAGING STUDIES: 1. Chest x-ray Apr 19, 2011, showed a stable cardiomegaly.  No acute     cardiopulmonary process. 2. A repeat chest x-ray done on May 18 showed new left lower lobe     infiltrates.  There are chronic bronchitic-type lung changes and     COPD.  There is tortuosity and calcification of the thoracic aorta. 3. X-ray of the right side of the neck did not show any prevertebral     soft tissue swelling. 4. 2-D echo done on Apr 20, 2011, showed mild LVH, EF of 60% to 65%     with a PA pressure of 42, compatible with pulmonary hypertension.  HOSPITAL COURSE: The patient was admitted to telemetry.  She was put on fluid restriction and started on Lasix 40 mg IV q.24 and potassium chloride 20 mEq p.o. daily.  She was also placed on spironolactone 25 mg p.o. daily and then started on Avelox 400 mg IV q.24.  She was also given losartan 25 mg p.o. daily.  Also added to the patient's regimen was Mucinex 600 mg p.o. b.i.d.  The patient was seen by me for the very first time in this index admission on Apr 20, 2011.  During this encounter, the patient was found to have rales in the lungs and subsequently the dose of Cozaar was increased to 50 mg p.o. daily, and Coreg 12.5 mg p.o. b.i.d.  She was started on Pravachol 40 mg p.o. daily.  The patient was also given Lasix 40 mg IV q.12 hourly and then placed on breathing treatments with albuterol and Atrovent nebs.  Avelox was discontinued, however, and the patient was started on Rocephin 1 g IV q.24 and Zithromax 500 mg IV q.24.  She was also to have chest physical therapy done during breathing treatment.  Added to her regimen were also lozenges 2 p.o. t.i.d. because of persistent irritation in the throat.  The patient was evaluated and followed by me on a daily basis and had gradual adjustment of her Lasix.  On Apr 22, 2011,  Rocephin was discontinued and she was started on Zosyn 3.375 g IV q.8 hourly.  She was also placed on  fluid restriction, and it is important to mention that the dose of Zosyn was adjusted by pharmacy to 2.25 g IV q.8 hourly. On Apr 23, 2011, the patient was said to have complained about pain on the right side of the neck and inability to move the right side of the neck although she denied any systemic symptoms, i.e., no fever, no chills, no rigor.  No nausea or vomiting.  Examination showed rales medial and lower lobes of the lung fields.  Subsequently, the patient's Lasix was adjusted to Lasix 80 mg IV q.12 hourly.  Lasix dosing was discontinued and the patient was given Levaquin 250 mg IV q.24.  She was also given a muscle relaxant Robaxin 25  mg p.o. b.i.d. and heat pad to be applied to the right side of the neck.  X-ray of the right neck was ordered and the result is as described above, and she was also to have a swallow evaluation done.  She was evaluated on a daily basis.  She was seen by me today, feels better but she complained about pain on the right side of the neck and examination of the patient was essentially unremarkable.  Chest exam showed remarkable resolution of the rales. Her vital signs:  Blood pressure is 107/67, temperature is 97.7, pulse is 96, respiratory rate is 16.  The plan therefore today is to adjust the dosing frequency of Robaxin to 250 mg p.o. t.i.d.  Lasix will be reduced to 40 mg IV to q.24.  She will have an MRI of the right side of the neck done today and also a portable chest x-ray.  She will be followed and evaluated on a daily basis.  Time of discharge will be determined by the rounding physician.     Talmage Nap, MD     CN/MEDQ  D:  04/25/2011  T:  04/25/2011  Job:  [id number]  Electronically Signed by Talmage Nap  on 04/25/2011 06:37:22 PM

## 2011-04-26 LAB — COMPREHENSIVE METABOLIC PANEL
ALT: 39 U/L — ABNORMAL HIGH (ref 0–35)
AST: 51 U/L — ABNORMAL HIGH (ref 0–37)
Albumin: 2.7 g/dL — ABNORMAL LOW (ref 3.5–5.2)
Alkaline Phosphatase: 87 U/L (ref 39–117)
Chloride: 94 mEq/L — ABNORMAL LOW (ref 96–112)
GFR calc Af Amer: 40 mL/min — ABNORMAL LOW (ref >60)
Potassium: 4.6 mEq/L (ref 3.5–5.1)
Sodium: 135 mEq/L (ref 135–145)
Total Bilirubin: 0.7 mg/dL (ref 0.3–1.2)
Total Protein: 7.7 g/dL (ref 6.0–8.3)

## 2011-04-26 LAB — CBC
MCV: 92.1 fL (ref 78.0–100.0)
Platelets: 196 10*3/uL (ref 150–400)
RBC: 4.44 MIL/uL (ref 3.87–5.11)
RDW: 12.6 % (ref 11.5–15.5)
WBC: 12 10*3/uL — ABNORMAL HIGH (ref 4.0–10.5)

## 2011-04-26 LAB — DIFFERENTIAL
Basophils Absolute: 0 10*3/uL (ref 0.0–0.1)
Eosinophils Absolute: 0 10*3/uL (ref 0.0–0.7)
Eosinophils Relative: 0 % (ref 0–5)
Lymphs Abs: 1.6 10*3/uL (ref 0.7–4.0)
Neutrophils Relative %: 71 % (ref 43–77)

## 2011-04-26 LAB — GLUCOSE, CAPILLARY
Glucose-Capillary: 195 mg/dL — ABNORMAL HIGH (ref 70–99)
Glucose-Capillary: 203 mg/dL — ABNORMAL HIGH (ref 70–99)

## 2011-04-27 ENCOUNTER — Inpatient Hospital Stay (HOSPITAL_COMMUNITY): Payer: Medicare Other

## 2011-04-27 LAB — COMPREHENSIVE METABOLIC PANEL
AST: 87 U/L — ABNORMAL HIGH (ref 0–37)
Albumin: 2.6 g/dL — ABNORMAL LOW (ref 3.5–5.2)
Alkaline Phosphatase: 104 U/L (ref 39–117)
BUN: 49 mg/dL — ABNORMAL HIGH (ref 6–23)
CO2: 27 mEq/L (ref 19–32)
Chloride: 95 mEq/L — ABNORMAL LOW (ref 96–112)
GFR calc Af Amer: 47 mL/min — ABNORMAL LOW (ref >60)
GFR calc non Af Amer: 39 mL/min — ABNORMAL LOW (ref >60)
Potassium: 4.4 mEq/L (ref 3.5–5.1)
Total Bilirubin: 0.9 mg/dL (ref 0.3–1.2)

## 2011-04-27 LAB — GLUCOSE, CAPILLARY: Glucose-Capillary: 255 mg/dL — ABNORMAL HIGH (ref 70–99)

## 2011-04-27 LAB — CBC
Hemoglobin: 13.6 g/dL (ref 12.0–15.0)
MCV: 91.6 fL (ref 78.0–100.0)
Platelets: 224 10*3/uL (ref 150–400)
RBC: 4.41 MIL/uL (ref 3.87–5.11)
WBC: 10.5 10*3/uL (ref 4.0–10.5)

## 2011-04-27 LAB — DIFFERENTIAL
Eosinophils Absolute: 0 10*3/uL (ref 0.0–0.7)
Lymphocytes Relative: 15 % (ref 12–46)
Lymphs Abs: 1.6 10*3/uL (ref 0.7–4.0)
Monocytes Relative: 16 % — ABNORMAL HIGH (ref 3–12)
Neutro Abs: 7.2 10*3/uL (ref 1.7–7.7)
Neutrophils Relative %: 68 % (ref 43–77)

## 2011-04-28 ENCOUNTER — Inpatient Hospital Stay (HOSPITAL_COMMUNITY): Payer: Medicare Other

## 2011-04-28 LAB — GLUCOSE, CAPILLARY
Glucose-Capillary: 196 mg/dL — ABNORMAL HIGH (ref 70–99)
Glucose-Capillary: 225 mg/dL — ABNORMAL HIGH (ref 70–99)

## 2011-04-28 LAB — BASIC METABOLIC PANEL
Chloride: 94 mEq/L — ABNORMAL LOW (ref 96–112)
GFR calc Af Amer: 45 mL/min — ABNORMAL LOW (ref >60)
Potassium: 4.6 mEq/L (ref 3.5–5.1)

## 2011-04-29 LAB — GLUCOSE, CAPILLARY
Glucose-Capillary: 192 mg/dL — ABNORMAL HIGH (ref 70–99)
Glucose-Capillary: 216 mg/dL — ABNORMAL HIGH (ref 70–99)

## 2011-04-30 NOTE — Discharge Summary (Signed)
[name], [name]                   ACCOUNT NO.:  [id number]  MEDICAL RECORD NO.:  [id number]           PATIENT TYPE:  I  LOCATION:  1401                         FACILITY:  [facility]  PHYSICIAN:  Osvaldo Shipper, MD     DATE OF BIRTH:  October 05, 1911  DATE OF ADMISSION:  04/19/2011 DATE OF DISCHARGE:  04/30/2011                        DISCHARGE SUMMARY - REFERRING   PRIMARY CARE PHYSICIAN:  The patient's primary care physician Dr. Alroy Dust.  No consultations obtained during this admission.  IMAGING STUDIES:  Imaging studies done during this admission include the following: 1. CT of the head which showed atrophy and chronic microvascular     ischemia without any acute changes.  Acute on chronic sinusitis was     seen. 2. Chest x-ray showed stable cardiomegaly without any acute     cardiopulmonary disease on Apr 19, 2011. 3. Chest x-ray on Apr 22, 2011, showed new left lower lobe infiltrate. 4. Neck x-ray on Apr 23, 2011, was negative. 5. Chest x-ray on Apr 25, 2011, showed worsening pneumonia. 6. MRI of the cervical spine on Apr 26, 2011, showed severe facet     joint arthritis on the left from C3 to C6-7, moderate facet     arthritis from C2-3 to C7-T1 and other findings were thought to be     stable. 7. Chest x-ray on Apr 27, 2011, showed improving infiltrate at the     left lung base and the patient had a barium swallow which was a     very limited study, swallowing only small boluses but did not show     any obvious obstruction. 8. She also had an echocardiogram which showed EF of 60-65% with     trivial aortic regurgitation, calcified mitral valve annulus  LABORATORY DATA:  Pertinent labs include at the time of admission a normal white count, normal BUN and creatinine.  She did become prerenal because of diuresis during this hospitalization.  She also had mild elevation later on to 10.8 of her white cell count.  Blood sugars have been running between 115 and 120.  Sodium 134.   Cardiac enzymes were negative.  BNP was 1743, PSA 0.8.  Direct LDL was 72, HDL was 35.  Urine cultures grew Klebsiella and Enterococcus.  BRIEF HOSPITAL COURSE: 1. Pneumonia, was treated with antibiotics.  She still has a cough but     is much better. 2. Congestive heart failure thought to be diastolic dysfunction,     improved with diuresis. 3. History of hypertension, currently mildly hypotensive.  Will     require adjustment to her medications.  There is no evidence for     sepsis at this time. 4. Diabetes type 2, will be started on glipizide at a very low dose. 5. Urinary retention due to physical and activity as well as     constipation.  The patient may require Foley catheterization     temporarily. 6. UTI with Klebsiella and Enterococcus was seen and the patient was     started on appropriate antibiotics and she will be sent out on  Avelox. 7. She has protein-calorie malnutrition for which she will be put on     Glucerna and multivitamins. 8. She was found to have abscess ulcers in her mouth for which viscous     lidocaine will be provided. 9. She has history of AFib but not on anticoagulation, heart rate is     stable. 10.She is severely deconditioned and will require skilled nursing     placement for short-term rehab. 11.She is a DNR/DNI.  I had extensive discussions with both the daughters and I have told them that realistically she has had a significant set back due to her hospital stay.  She has significantly deconditioned.  Skilled nursing may be beneficial, however, she may not return completely to her baseline.  They verbalized understanding.  Today, there was also concern about somewhat of a low blood pressure as well as her urinary retention and so we are postponing her discharge until tomorrow.  We will adjust her blood pressure medications today, see her response and we will try to get her out of bed to a bedside commode to see if she can urinate.  If she  cannot urinate, Foley catheter will have to be placed again and this will need to be pursued at the skilled nursing facility and attempts may have to be made at the SNF to get her off the Foley when she is more active.  DISCHARGE MEDICATIONS:  At this time the discharge medications are as follows: 1. Albuterol nebulized 2.5 mg 3 times a day. 2. Carvedilol 3.125 mg twice daily with meals. 3. Colace 100 mg p.o. b.i.d. Hold for diarrhea.** 4. Glipizide XL 2.5 mg daily. 5. Mucinex 600 mg twice daily. 6. Atrovent 0.5 mg 3 times a day nebulized 7. Robaxin 250 mg t.i.d. as needed for neck spasms. 8. Avelox 400 mg daily for 6 days. 9. Glucerna 240 mL 3 times a day. 10.MiraLax 17 g twice daily as needed for constipation.** 11.Potassium chloride 20 mEq once daily. 12.Viscus lidocaine 15 mL swish and spit every 6 hours. 13.Furosemide 20 mg daily. 14.Aspirin 81 mg daily. 15.Gabapentin 300 mg 2 capsules daily at bedtime, hold for sedation. 16.Hydrocodone/acetaminophen 5/500 every 6 hours as needed, not to     exceed 3 tablets per day. 17.Lidex ointment applied to rash twice daily as needed. 18.Meclizine 25 mg 1/2 to 1 tablet daily as needed for dizziness. 19.Ocuvite 2 tablets daily. 20.Pravachol 40 mg daily at bedtime. 21.Prilosec 20 mg daily. 22.Tylenol 325 mg 1 tablet every 6 hours as needed.  The patient     alternates this with hydrocodone/acetaminophen, not to exceed 3     tablets a day. 23.Vitamin D2 2000 units 1 tablet daily.  We have discontinued her atenolol, losartan and metformin.  DIET:  Soft diet with ground meats with thin liquids.  Please follow full aspiration precautions.  Physical activity per rehab.  She does use a walker.  Total time on this discharge encounter is 35 minutes.  **Changes made to medications after dicatation was transcribed.     Osvaldo Shipper, MD     GK/MEDQ  D:  04/29/2011  T:  04/29/2011  Job:  [id number]  cc:   Lonzo Cloud. Kriste Basque, MD [address] [place] [address]  Electronically Signed by Osvaldo Shipper MD on 04/30/2011 08:41:52 AM

## 2011-05-04 NOTE — H&P (Signed)
[name], [name]                   ACCOUNT NO.:  [id number]  MEDICAL RECORD NO.:  [id number]           PATIENT TYPE:  I  LOCATION:  1401                         FACILITY:  [facility]  PHYSICIAN:  Jacqueline Siren, MD           DATE OF BIRTH:  06-03-1911  DATE OF ADMISSION:  04/19/2011 DATE OF DISCHARGE:                             HISTORY & PHYSICAL   PRIMARY CARE PHYSICIAN:  Jacqueline Cloud. Kriste Basque, MD  ADVANCED DIRECTIVE:  Do no resuscitate.  This was reconfirmed tonight.  REASON FOR ADMISSION:  Weakness, found to be in congestive heart failure.  HISTORY OF PRESENT ILLNESS:  This is a 77 year old female with history of atrial fibrillation on aspirin, hypertension, hypercholesterolemia, who lives at home with her daughter, brought into the emergency room complaining mainly of feeling weak and having a nonproductive cough.  She admits to having increased dyspnea on exertion and some orthopnea.  She denies any fever, chills or any cough.  She had increased swellings of her lower extremities as well.  Evaluation in the emergency room showed elevated BNP at 1700.  Her baseline is about 100 to 200.  She further has a negative urinalysis.  Her chest x-ray shows stable cardiomegaly.  Her head CT is negative except for acute sinusitis.  EKG showed atrial fibrillation with normal rate.  She has normal white count, hemoglobin, and normal creatinine.  Her cardiac markers were negative as well.  Hospitalist was asked to admit the patient for acute on chronic systolic congestive heart failure.  PAST MEDICAL HISTORY: 1. Arthritis. 2. Atrial fibrillation. 3. Gastroesophageal reflux disease. 4. Presbycusis. 5. Hypercholesterolemia. 6. Hypertension. 7. Macular degeneration.  SOCIAL HISTORY:  She is a widow living with her daughter.  She is retired denied tobacco, alcohol, or drug use.  CURRENT MEDICATIONS: 1. Aspirin. 2. Atenolol. 3. Lasix. 4. Gabapentin. 5. Glucophage XR. 6. Hydrocodone. 7.  Losartan. 8. Meclizine. 9. Ocuvite. 10.Pravachol. 11.Prilosec. 12.Tylenol.  ALLERGY: 1. NSAIDS. 2. VOLTAIRE.  REVIEW OF SYSTEMS:  Otherwise unremarkable.  FAMILY HISTORY:  Noncontributory.  PHYSICAL EXAM:  VITAL SIGNS:  Blood pressure 179/100, pulse of 97, respiratory rate of 20, temperature 99.1. GENERAL:  Exam shows she is alert and oriented and is in no apparent distress.  Her skin is warm and dry.  She has facial symmetry with fluent speech.  She is alert and conversing. HEENT:  Throat is clear. NECK:  Supple.  She has about a 5-cm JVD. CARDIAC:  S1, S2 irregular.  I did not hear any murmur or gallop. LUNGS:  Scattered rhonchi and rales bibasilar and mild expiratory wheezes as well. ABDOMEN:  Obese, nondistended, nontender.  No palpable mass or rebound. EXTREMITIES:  1+ edema bilaterally.  No calf tenderness.  Good distal pulses bilaterally.  OBJECTIVE FINDINGS:  EKGs show atrial fibrillation with normal ventricular rate.  No acute changes.  BNP 1700.  Urinalysis is negative. Chest x-ray shows stable cardiomegaly.  Head CT showed acute sinusitis. White count of 6600, hemoglobin of 13.7, platelet count of 156,000, potassium 3.8, glucose 134, BUN 21, creatinine 1.08.  Urinalysis is negative.Marland Kitchen  IMPRESSION:  This is a 77 year old female who presented with weakness and found to be in congestive heart failure.  She likely had an acute on chronic systolic congestive heart failure.  Will admit, get an echo. Her weakness is likely multifactorial.  I will continue her current medications but probably better to change her Lasix to spinorolactone .  For now, she will get intravenous furosemide. She will get supplemental O2.  Will treat her sinusitis with Avelox IV.  She is a DNR and this was reconfirmed, and we will honor her wish.  Currently, her oldest daughter who is her primary caretaker, just had an aortic valve replacement and it may be difficult for her to go home.  We  should consider short-term rehab but will see how she does with treatment.  She is stable, will be admitted to telemetry to [facility] 1.     Jacqueline Siren, MD     PL/MEDQ  D:  04/20/2011  T:  04/20/2011  Job:  [id number]  cc:   Jacqueline Cloud. Kriste Basque, MD [address] [place] [address]  Electronically Signed by [name]  on 05/04/2011 02:56:48 AM

## 2011-07-05 ENCOUNTER — Encounter: Payer: Self-pay | Admitting: Pulmonary Disease

## 2011-07-05 ENCOUNTER — Ambulatory Visit (INDEPENDENT_AMBULATORY_CARE_PROVIDER_SITE_OTHER): Payer: Medicare Other | Admitting: Pulmonary Disease

## 2011-07-05 ENCOUNTER — Other Ambulatory Visit (INDEPENDENT_AMBULATORY_CARE_PROVIDER_SITE_OTHER): Payer: Medicare Other

## 2011-07-05 DIAGNOSIS — J984 Other disorders of lung: Secondary | ICD-10-CM

## 2011-07-05 DIAGNOSIS — E119 Type 2 diabetes mellitus without complications: Secondary | ICD-10-CM

## 2011-07-05 DIAGNOSIS — I10 Essential (primary) hypertension: Secondary | ICD-10-CM

## 2011-07-05 DIAGNOSIS — M1990 Unspecified osteoarthritis, unspecified site: Secondary | ICD-10-CM

## 2011-07-05 DIAGNOSIS — F411 Generalized anxiety disorder: Secondary | ICD-10-CM

## 2011-07-05 DIAGNOSIS — K219 Gastro-esophageal reflux disease without esophagitis: Secondary | ICD-10-CM

## 2011-07-05 DIAGNOSIS — E7800 Pure hypercholesterolemia, unspecified: Secondary | ICD-10-CM

## 2011-07-05 DIAGNOSIS — I872 Venous insufficiency (chronic) (peripheral): Secondary | ICD-10-CM

## 2011-07-05 DIAGNOSIS — L309 Dermatitis, unspecified: Secondary | ICD-10-CM

## 2011-07-05 DIAGNOSIS — I509 Heart failure, unspecified: Secondary | ICD-10-CM

## 2011-07-05 DIAGNOSIS — E559 Vitamin D deficiency, unspecified: Secondary | ICD-10-CM

## 2011-07-05 DIAGNOSIS — I4891 Unspecified atrial fibrillation: Secondary | ICD-10-CM

## 2011-07-05 LAB — CBC WITH DIFFERENTIAL/PLATELET
Basophils Absolute: 0 10*3/uL (ref 0.0–0.1)
Eosinophils Absolute: 0.1 10*3/uL (ref 0.0–0.7)
Lymphocytes Relative: 32.1 % (ref 12.0–46.0)
MCHC: 33.2 g/dL (ref 30.0–36.0)
Neutro Abs: 3.4 10*3/uL (ref 1.4–7.7)
Neutrophils Relative %: 52.1 % (ref 43.0–77.0)
Platelets: 167 10*3/uL (ref 150.0–400.0)
RDW: 14.9 % — ABNORMAL HIGH (ref 11.5–14.6)

## 2011-07-05 LAB — BASIC METABOLIC PANEL
BUN: 13 mg/dL (ref 6–23)
CO2: 30 mEq/L (ref 19–32)
Calcium: 9.2 mg/dL (ref 8.4–10.5)
Creatinine, Ser: 0.9 mg/dL (ref 0.4–1.2)
Glucose, Bld: 101 mg/dL — ABNORMAL HIGH (ref 70–99)

## 2011-07-05 MED ORDER — CLOBETASOL PROPIONATE 0.05 % EX CREA: TOPICAL_CREAM | Freq: Two times a day (BID) | CUTANEOUS | Status: DC

## 2011-07-05 MED ORDER — POTASSIUM CHLORIDE CRYS ER 20 MEQ PO TBCR: 20.0000 meq | EXTENDED_RELEASE_TABLET | Freq: Every day | ORAL | Status: DC

## 2011-07-05 MED ORDER — FUROSEMIDE 20 MG PO TABS: 20.0000 mg | ORAL_TABLET | Freq: Every day | ORAL | Status: DC

## 2011-07-05 MED ORDER — GLIPIZIDE ER 2.5 MG PO TB24: 2.5000 mg | ORAL_TABLET | Freq: Every day | ORAL | Status: DC

## 2011-07-05 MED ORDER — CARVEDILOL 3.125 MG PO TABS: 3.1250 mg | ORAL_TABLET | Freq: Two times a day (BID) | ORAL | Status: DC

## 2011-07-05 MED ORDER — PRAVASTATIN SODIUM 40 MG PO TABS: ORAL_TABLET | ORAL | Status: DC

## 2011-07-05 NOTE — Patient Instructions (Signed)
Today we updated your med list in EPIC...    ...and we refilled your meds per request...  Try the new CLOBETASOL cream for the rash on your leg & we can refer to Dermatology if not responding...  Today we did your follow up blood work...    Please call the [facility]REE in a few days for your results...    Dial [id number] & when prompted enter your patient number followed by the # symbol...    Your patient number is:  [id number]  Call for any questions or if we can be of service in a ny way...  Let's plan a routine follow up recheck in 3-4 months.Marland KitchenMarland Kitchen

## 2011-07-05 NOTE — Progress Notes (Signed)
Subjective:    Patient ID: [name], female    DOB: 1911-04-25, 77 y.o.   MRN: [id number]  HPI 77 y/o WF here for an add-on visit due to a fall yest... she has mult medical problems as noted below...   ~  August 18, 2010:  she fell 2wks ago- hit occiput w/ hematoma on scalp> ER eval reviewed: lost balance; fell into bathtub; no LOC;  labs- OK, BS=180, +UTI given macrobid;  CT Brain= atrophy, chr microvasc dis, NAD;  CT CSpine= multilevel spondylosis & suboccipital hematoma in soft tissues w/o fx etc...  She had Ophthal f/u 3/11 DrRankin> senile macular degen OU, vitreous detach OS, bilat Drusen...  she received letter from [facility] DM & needs to be on ACE or ARB, therefore start Losartan 25mg /d...  ~  October 11, 2010:  add-on appt today after fall at home yest> she has been sl dizzy in the AMs & fell in the bathroom yest AM striking right occiput on commode- no blackout etc, didn't break skin, no hematoma but area bruised, min tender, neck OK, etc... she notes severe arthritis w/ pain & left knee gives way (right TKR in past)... she had recent infected callous tip of left 3rd hammer toe w/ drainage & antibiotic rx from DrSikora- much improved now (prob neuropathy & they are planning to get DM shoes)... BP appears well controlled on meds & no postural changes apparent, but sl higher in AMs & we discussed poss strategies w/ timing of pills etc... Plan- check CT Brain w/o contrast>   ~  February 14, 2011:  4 mo ROV- 77 y/o now & no further falls...    BP controlled on Aten, Losar, & Lasix> 110.54 today, no postural change & feeling well w/o CP, palpit, ch in SOB,syncope, etc;  CXR f/u today w/ Cardiomeg (no change), clear/ NAD;  Labs look good & BNP=211 (continue same meds);  she has AFib w/ rate control strategy> doing well on meds +ASA...    Chol remains stable on Prav40 + diet;  not fasting today> continue same...    DM controlled on her Metformin Bid + diet> BS 136, A1c 6.8; continue same Rx...   Mild renal insuffic w/ BUN 27, creat 1.5 on meds +Lasix 40mg /d; continue same for now...    Other problems: GI stable; Vit D level= 50 & supplemented w/ 2000 u daily; no recent falls w/ her new DM shows from Podiatrist...  ~  July, 31, 2012:  45mo ROV & she was [facility] 5/12 by [facility] w/ ?Pneumonia LLL (no sputum, no cultures), CHF/ DD, AFib not a coumadin cand, UTI (urine +Klebs & Enterococcus, treated w/ Avelox), & deconditioned; disch to [facility] for rehab, now back home w/ difficulty & help including home PT (not much benefit per pt, but daugh thinks it's helping);  She had prob w/ bladder, required foley, had f/u Urology & cath has been removed; currently she is more mobile, hard to rise from chair, walks some, showers, states she's ok w/ ADLs;  She is c/o a dermatitis on leg today- looks like psoriasis, try Clobetasol cream (if no better she'll need Derm refer)...  They request refills today- OK >>     We are having the usual difficult reconciling her meds from prev office, then hosp disch, then NH disch ==> now new home meds >> she is asked to bring all med bottles for review!!!   Problem List:  MACULAR DEGENERATION (ICD-362.50) - on OCUVITES daily... s/p  laser surg by DrRankin... she cannot see well enough to read... they tried Bilberry Juice as heard on the [facility] [employer]- it's helping... ~  3/10: had f/u w/ DrRankin- stabilized... nothing else he can do... f/u 73yr. ~  3/11:  had f/u DrRankin> senile macular degen OU (worsening), vitreous detach OS (stable), bilat Drusen...    PULMONARY NODULE (ICD-518.89) - RUL nodule, eval 2007 w/ bx = granuloma...  ~  Serial CXRs have been stable... ~  CXR 3/11 showed chr changes & calcif granulomas, prob calcif of LAD seen on lat. ~  CXR 5/12 w/ Hosp for LLL infiltrate, improved serially w/ antibiotic rx.  HYPERTENSION (ICD-401.9) - she takes ASA 81mg /d, prev Aten ch to COREG 3.125mg Bid, LOSARTAN 25mg /d, & prev LASIX40 ch to 20mg /d, K20 added 1/d...  BP=116/64, no postural changes, tol meds well; denies HA, CP, palpit, change in dyspnea, edema, etc. ~  9/11:  she received letter from [facility] stating she should be on ACE or ARB Rx> add Losartan 25mg /d... ~  5/12:  Hosp by [facility] w/ LLL pneumonia, UTI, CHF/ DD & meds were changed/ adjusted> Losartan & Atenolol stopped, Coreg added, Lasix decreased KCl added. ~  7/12:  1st post hosp (5/12) visit & meds reviewed & reconciled  CONGESTIVE HEART FAILURE (ICD-428.0) - on above meds... ~  2DEcho 6/06 showed biatrial enlargement, mild MR/TR, norm LVF... ~  labs 3/11 showed BNP= 189... ~  Labs from 5/12 hosp reviewed in [place]...  ATRIAL FIBRILLATION, CHRONIC (ICD-427.31) - prev on Coumadin, pt & family decided to stop Coumadin in 2007, & now on ASA daily... rate control strategy w/ Aten (now COREG)...  VENOUS INSUFFICIENCY (ICD-459.81) - on low sodium diet, and Lasix... no edema...  HYPERCHOLESTEROLEMIA (ICD-272.0) - on PRAVACHOL 40mg Qhs and tol well... ~  FLP 11/08 showed TChol 151, TG 152, HDL 38, LDL 82... > continue med & diet efforts. ~  FLP 3/09 shows TChol 206, TG 332, HDL 40, LDL 97... > cont same, get wt down! ~  FLP 3/10 showed TChol 172, TG 214, HDL 43, LDL 92... > improved- continue same. ~  FLP 3/11 showed TChol 167, TG 236, HDL 46, LDL 83 ~  FLP 9/11 showed TChol 144, TG 212, HDL 36, LDL 72... reminded of low fat diet rec...  DM (ICD-250.00) - prev on Metform500bid, stopped 5/12 Hosp & ch to GLIPIZIDE XL 2.5mg  Qam... ~  labs 11/08 showed FBS=182, HgA1c=7.1... >continue attempts at diet control. ~  labs 3/09 showed  FBS=178, HgA1c=7.2... >she will need to start meds: Metformin ER 500mg Qam. ~  labs 7/09 showed BS= 138, HgA1c= 6.7.Marland KitchenMarland Kitchenrec- keep same.  ~  labs 3/10 (wt=162#) showed 139, HgA1c= 6.6... > continue same meds. ~  labs 9/10 (wt=175#) showed BS= 158, A1c= 6.8.Marland KitchenMarland Kitchen needs better diet. ~  labs 3/11 (wt=179#) showed BS= 155, A1c= 7.6.Marland KitchenMarland Kitchen may need incr meds! get wt down. ~  labs 9/11  (wt=173#) showed BS= 166, A1c= 8.0.Marland KitchenMarland Kitchen worsening A1c- rec incr Metform to 2/d... ~  Urine microalb 9/11 = neg... ~  Labs 5/12 Hosp reviewed in [place]... ~  Labs 7/12 in office showed BS= 101, A1c= 6.7.Marland KitchenMarland Kitchen rec to continue same.  GERD (ICD-530.81) - on PRILOSEC 20mg /d, doing well without heartburn, n/v, etc... Hx HH, GERD, and prev DU... last EGD was 7/02 showing HH, reflux, stricture, gastric ulcer... she has refused colonoscopies in the past...  UTI'S, HX OF (ICD-V13.00) - hx UTI's, s/p hysterectomy, prev bladder surg... ~  8/11:  ER visit after fall w/ UTI Rx'd  w/ Macrobid...  DEGENERATIVE JOINT DISEASE (ICD-715.90)  - severe arthritis w/ prev right TKR 1990... followed by DrGioffre, DrBednarz, DrSikora (hammer toes)- not willing to consider more surg... uses Vicodin 2-3 per day as needed, shots not helpful in the past... leg discomfort may be neuropathy (but monofilament test is normal)- on Neurontin Rx taking 300mg - 2Qhs... ~  she ambulates w/ walker, she has had phys therapy & does chair exercises, she has knee braces, etc...  VITAMIN D DEFICIENCY (ICD-268.9) - not currently taking any supplement. ~  labs 7/09 showed Vit D level = 11... rec> start Vit D 50000 u weekly (but she stopped after 54mo). ~  labs 3/10 showed Vit D level = 30... rec> change to 2000 u daily.  ABNORMALITY OF GAIT (ICD-781.2) & Frequent FALLS>  ~  9/11:  she fell 2wks ago- hit occiput w/ hematoma on scalp> ER eval reviewed: lost balance; fell into bathtub; no LOC;  labs- OK, BS=180, +UTI given macrobid;  CT Brain= atrophy, chr microvasc dis, NAD;  CT CSpine= multilevel spondylosis & suboccipital hematoma in soft tissues w/o fx etc...   ~  11/11:  she fell yest- hit right occiput on commode w/ bruise but no hematoma; didn't go to the ER; no focal neuro deficits & we discussed checking CT Brain to be sure no subdural etc...  ANXIETY (ICD-300.00  DERMATITIS - uses generic LIDEX E cream as needed...  Health Maintenance:   she gets the yearly Flu vaccine each Autum[employer];  and she had PNEUMOVAX ~2005 at [facility], she says.   Past Surgical History  Procedure Date  . Cholecystectomy 6/07    Dr. Corliss Skains  . Total abdominal hysterectomy   . Bladder surgery   . Total knee arthroplasty 1990    right - Dr. Darrelyn Hillock    Outpatient Encounter Prescriptions as of 07/05/2011  Medication Sig Dispense Refill  . acetaminophen (TYLENOL) 325 MG tablet as needed - alternates this with hydrocodone       . carvedilol (COREG) 3.125 MG tablet Take 3.125 mg by mouth 2 (two) times daily with a meal.        . Cholecalciferol (VITAMIN D) 2000 UNITS CAPS 1 capsule by mouth once daily       . furosemide (LASIX) 20 MG tablet Take 20 mg by mouth daily.        Marland Kitchen gabapentin (NEURONTIN) 300 MG capsule 2 capsules by mouth at bedtime       . glipiZIDE (GLUCOTROL XL) 2.5 MG 24 hr tablet Take 2.5 mg by mouth daily.        Marland Kitchen HYDROcodone-acetaminophen (VICODIN) 5-500 MG per tablet 1 tab by mouth every 4-6 hours as needed for pain.Marland Kitchendo not exceed 3 tabs per day  90 tablet  5  . meclizine (ANTIVERT) 25 MG tablet Take 1/2 - 1 tab by mouth every 6 hours as needed for dizziness       . Multiple Vitamins-Minerals (OCUVITE PO) 2 tabs by mouth once daily       . omeprazole (PRILOSEC) 20 MG capsule Take 1 capsule by mouth 30 minutes before first meal of the day       . potassium chloride SA (K-DUR,KLOR-CON) 20 MEQ tablet Take 20 mEq by mouth daily.        . pravastatin (PRAVACHOL) 40 MG tablet Take 1 tablet by mouth at bedtime.       Marland Kitchen DISCONTD: aspirin 81 MG tablet Take by mouth daily.        Marland Kitchen DISCONTD:  atenolol (TENORMIN) 25 MG tablet TAKE 1 TABLET ONCE DAILY.  1 tablet  6  . DISCONTD: fluocinonide (LIDEX) 0.05 % cream Apply to rash as needed       . DISCONTD: LASIX 40 MG tablet TAKE 1 TABLET EACH DAY.  1 each  6  . DISCONTD: losartan (COZAAR) 25 MG tablet Take by mouth daily.        Marland Kitchen DISCONTD: metFORMIN (GLUCOPHAGE) 500 MG tablet Take by mouth 2 (two) times  daily with a meal.          Allergies  Allergen Reactions  . Celecoxib     GI discomfort  . Diclofenac Sodium     REACTION: causing burining sensation after use    Current Medications, Allergies, Past Medical History, Past Surgical History, Family History, and Social History were reviewed in [employer] record.    Review of Systems    See HPI - all other systems neg except as noted...  The patient complains of vision loss, decreased hearing, dyspnea on exertion, muscle weakness, and difficulty walking.  The patient denies anorexia, fever, weight loss, weight gain, hoarseness, chest pain, syncope, peripheral edema, prolonged cough, headaches, hemoptysis, abdominal pain, melena, hematochezia, severe indigestion/heartburn, hematuria, incontinence, suspicious skin lesions, transient blindness, depression, unusual weight change, abnormal bleeding, enlarged lymph nodes, and angioedema.   Objective:   Physical Exam     WD, WN, 77 y/o WF chr ill appearing but in NAD... GENERAL:  Alert & oriented; pleasant & cooperative... sm bruise right occiput, no hematoma. HEENT:  [place]/AT, EOM-wnl, poor vision from macular degen; EACs-clear, TMs-wnl, NOSE-clear, THROAT-clear w/ dry MM's. NECK:  Supple w/ fairROM; no JVD; normal carotid impulses w/o bruits; no thyromegaly or nodules palpated; no lymphadenopathy. CHEST:  Clear to P & A; without wheezes/ rales/ or rhonchi. HEART:  irreg, gr 1/6 SEM without rubs or gallops detected... ABDOMEN:  Soft & nontender; normal bowel sounds; no organomegaly or masses detected. EXT:  severe arthritic changes & rightTKR; no varicose veins/ +venous insuffic/ tr edema. NEURO:  CN's intact; +gait abn, no focal neuro deficits x sl decr sensation in LE's... DERM:  No lesions noted; no rash etc...   Assessment & Plan:   HBP/ CHF (DD)/ AFib>  meds were changed, adjusted, & now reconciled betw prev office, hosp, NH, & now back home;  Asked to bring all  bottles to office for review... For now we will continue same meds x decr KCl to 1/2 tab daily.  CHOL>  Continue Prev40 Qhs and take regularly, needs ROV w/ FASTING blood work on return...  DM>  meds were changed, adjusted, & now reconciled since her prev Hosp;  Off Metform & now on Glipizide (low dose) w/ BS 101 & A1c 6.7; reminded to eat well & regularly to avoid hypogly on the sulfonylurea rx started in last hosp (NOTE: Creat=0.9 & she could certainly return to Metformin therapy if needed)...  GERD>  Stable on PPI rx daily...  UTIs>  Treated during the 5/12 hosp & NH stay; she had prob w/ bladder, now foley out & improved...  DJD/ Gait Abn/ etc>  She is getting home therapy...  Dermatitis>  Try the Clobetasol cream...  Other medical problems as noted.Marland KitchenMarland KitchenMarland Kitchen

## 2011-07-13 ENCOUNTER — Encounter: Payer: Self-pay | Admitting: Pulmonary Disease

## 2011-07-14 ENCOUNTER — Telehealth: Payer: Self-pay | Admitting: Pulmonary Disease

## 2011-07-14 NOTE — Telephone Encounter (Signed)
Pt daughter advised per results note. Carron Curie, CMA

## 2011-07-14 NOTE — Telephone Encounter (Signed)
Spoke with Leigh and she states the pt also needs to decrease her potassium to 1/2 tab daily. I advised the pt daughter of this. Carron Curie, CMA

## 2011-07-21 ENCOUNTER — Telehealth: Payer: Self-pay | Admitting: Pulmonary Disease

## 2011-07-21 NOTE — Telephone Encounter (Signed)
Called, spoke with Peggy.  She is aware TP recs pt use an extra lasix 20 mg daily as needed for leg swelling.  If this is not helping, pt will need an OV or go to ER if it worsens/not improving.  Peggy verbalized understanding of these recs.

## 2011-07-21 NOTE — Telephone Encounter (Signed)
Called and spoke with pt's daughter, Gigi Gin.  Peggy states pt was recently d/c'd from hosp approx 3 weeks ago.  States prior to be in hosp pt was on 40mg  of Lasix but since hosp d/c pt was decreased down to 20mg  daily.  Daughter concerned because pt is starting to retain fluid- swelling in Bil lower extremities despite elevating feet and limiting salt in diet.  Daughter states pt is lethargic, has difficulty walking, and has confusion.  She is worried as these are the same symptoms she had prior to being admitted to the hosp for CHF.  Pt is requesting doc's recs.  SN not in office this PM.  Will forward message to TP to see if she is able to address for pt in SN's absence.  Please advise.  Thanks.

## 2011-07-21 NOTE — Telephone Encounter (Signed)
Seen 2 weeks ago in office . May use extra lasix 20mg  daily as needed for leg swelling.  If not helping will need ov. Is he is not improving or worsens will need to to go to ER.  Please contact office for sooner follow up if symptoms do not improve or worsen or seek emergency care

## 2011-07-21 NOTE — Telephone Encounter (Signed)
NOTE: PT HAS GAINED A FEW LBS. DAUGHTER SAYS SHE HAS SOME FUROSEMIDE ON HAND. [facility] [phone].

## 2011-07-28 ENCOUNTER — Other Ambulatory Visit: Payer: Self-pay | Admitting: Pulmonary Disease

## 2011-07-28 MED ORDER — PRAVASTATIN SODIUM 40 MG PO TABS: ORAL_TABLET | ORAL | Status: DC

## 2011-08-15 ENCOUNTER — Ambulatory Visit: Payer: Medicare Other | Admitting: Pulmonary Disease

## 2011-08-24 ENCOUNTER — Telehealth: Payer: Self-pay | Admitting: Pulmonary Disease

## 2011-08-24 MED ORDER — FUROSEMIDE 20 MG PO TABS: 20.0000 mg | ORAL_TABLET | Freq: Every day | ORAL | Status: DC

## 2011-08-24 NOTE — Telephone Encounter (Signed)
Refill of furosemide has been sent to the pharmacy.

## 2011-08-29 ENCOUNTER — Telehealth: Payer: Self-pay | Admitting: Pulmonary Disease

## 2011-08-29 NOTE — Telephone Encounter (Signed)
I spoke with [name] and she states pt needed a refill on her lasix. She states does pt need to take 20mg  or 40 mg. i advised according to last phone 20 mg and only double if needed for the swelling at that time. She verbalized understanding. I have called this rx into the pharmacy.

## 2011-09-21 ENCOUNTER — Other Ambulatory Visit: Payer: Self-pay | Admitting: Pulmonary Disease

## 2011-10-06 ENCOUNTER — Ambulatory Visit: Payer: Medicare Other | Admitting: Pulmonary Disease

## 2011-10-11 ENCOUNTER — Other Ambulatory Visit: Payer: Self-pay | Admitting: Pulmonary Disease

## 2011-10-14 ENCOUNTER — Telehealth: Payer: Self-pay | Admitting: Pulmonary Disease

## 2011-10-14 NOTE — Telephone Encounter (Signed)
Called and spoke with pts daughter and she is aware that the vicodin rx has been sent to [facility].

## 2011-10-25 ENCOUNTER — Encounter: Payer: Self-pay | Admitting: Pulmonary Disease

## 2011-10-25 ENCOUNTER — Ambulatory Visit (INDEPENDENT_AMBULATORY_CARE_PROVIDER_SITE_OTHER): Payer: Medicare Other | Admitting: Pulmonary Disease

## 2011-10-25 DIAGNOSIS — K219 Gastro-esophageal reflux disease without esophagitis: Secondary | ICD-10-CM

## 2011-10-25 DIAGNOSIS — F411 Generalized anxiety disorder: Secondary | ICD-10-CM

## 2011-10-25 DIAGNOSIS — R32 Unspecified urinary incontinence: Secondary | ICD-10-CM | POA: Insufficient documentation

## 2011-10-25 DIAGNOSIS — I872 Venous insufficiency (chronic) (peripheral): Secondary | ICD-10-CM

## 2011-10-25 DIAGNOSIS — M1990 Unspecified osteoarthritis, unspecified site: Secondary | ICD-10-CM

## 2011-10-25 DIAGNOSIS — I10 Essential (primary) hypertension: Secondary | ICD-10-CM

## 2011-10-25 DIAGNOSIS — E119 Type 2 diabetes mellitus without complications: Secondary | ICD-10-CM

## 2011-10-25 DIAGNOSIS — E7800 Pure hypercholesterolemia, unspecified: Secondary | ICD-10-CM

## 2011-10-25 DIAGNOSIS — I509 Heart failure, unspecified: Secondary | ICD-10-CM

## 2011-10-25 DIAGNOSIS — I4891 Unspecified atrial fibrillation: Secondary | ICD-10-CM

## 2011-10-25 DIAGNOSIS — R269 Unspecified abnormalities of gait and mobility: Secondary | ICD-10-CM

## 2011-10-25 NOTE — Progress Notes (Signed)
Subjective:    Patient ID: [name], female    DOB: [birth year], 77 y.o.   MRN: [id number]  HPI 77 y/o WF here for an add-on visit due to a fall yest... she has mult medical problems as noted below...   ~  August 18, 2010:  she fell 2wks ago- hit occiput w/ hematoma on scalp> ER eval reviewed: lost balance; fell into bathtub; no LOC;  labs- OK, BS=180, +UTI given macrobid;  CT Brain= atrophy, chr microvasc dis, NAD;  CT CSpine= multilevel spondylosis & suboccipital hematoma in soft tissues w/o fx etc...  She had Ophthal f/u 3/11 DrRankin> senile macular degen OU, vitreous detach OS, bilat Drusen...  she received letter from [facility] DM & needs to be on ACE or ARB, therefore start Losartan 25mg /d...  ~  October 11, 2010:  add-on appt today after fall at home yest> she has been sl dizzy in the AMs & fell in the bathroom yest AM striking right occiput on commode- no blackout etc, didn't break skin, no hematoma but area bruised, min tender, neck OK, etc... she notes severe arthritis w/ pain & left knee gives way (right TKR in past)... she had recent infected callous tip of left 3rd hammer toe w/ drainage & antibiotic rx from DrSikora- much improved now (prob neuropathy & they are planning to get DM shoes)... BP appears well controlled on meds & no postural changes apparent, but sl higher in AMs & we discussed poss strategies w/ timing of pills etc... Plan- check CT Brain w/o contrast>   ~  February 14, 2011:  4 mo ROV- 77 y/o now & no further falls...    BP controlled on Aten, Losar, & Lasix> 110.54 today, no postural change & feeling well w/o CP, palpit, ch in SOB,syncope, etc;  CXR f/u today w/ Cardiomeg (no change), clear/ NAD;  Labs look good & BNP=211 (continue same meds);  she has AFib w/ rate control strategy> doing well on meds +ASA...    Chol remains stable on Prav40 + diet;  not fasting today> continue same...    DM controlled on her Metformin Bid + diet> BS 136, A1c 6.8; continue same Rx...   Mild renal insuffic w/ BUN 27, creat 1.5 on meds +Lasix 40mg /d; continue same for now...    Other problems: GI stable; Vit D level= 50 & supplemented w/ 2000 u daily; no recent falls w/ her new DM shows from Podiatrist...  ~  July, 31, 2012:  98mo ROV & she was [facility] 5/12 by [facility] w/ ?Pneumonia LLL (no sputum, no cultures), CHF/ DD, AFib not a coumadin cand, UTI (urine +Klebs & Enterococcus, treated w/ Avelox), & deconditioned; disch to [facility] for rehab, now back home w/ difficulty & help including home PT (not much benefit per pt, but daugh thinks it's helping);  She had prob w/ bladder, required foley, had f/u Urology & cath has been removed; currently she is more mobile, hard to rise from chair, walks some, showers, states she's ok w/ ADLs;  She is c/o a dermatitis on leg today- looks like psoriasis, try Clobetasol cream (if no better she'll need Derm refer)...  They request refills today- OK >>     We are having the usual difficult reconciling her meds from prev office, then hosp disch, then NH disch ==> now new home meds >> she is asked to bring all med bottles for review!!!  ~  October 25, 2011:  3-76mo ROV & essentially stable w/  mult minor complaints- eg.ears, heberden's nodes, urinary incont (doesn't want Urology or meds); Podiatry- DrSikora working on her toes; she is able to do her own ADLs- toilets, bathes, walks some HBP> on Coreg3.125Bid, Losartan25, Lasix20, K20; BP= 124/80 & she denies HA, CP, palpit, ch in SOB or edema... AFib & CHF> on ASA + above meds; rate control strategy, no Coumadin due to falls... Chol> on Prav40 & due for FLP (she will ret for this)... DM> on Glipiz2.5 & home BS checks ~100 she says; needs to ret for A1c... GERD> on Prilosec20 & symptoms controlled... Urology> followed by DrTannenbaum, emptying better & bladder function improved... DJD> hx severe arthritis w/ right TKR & using vicodin 2-3/d as needed... Gait Abn & Falls> on Neurontin300-2Qhs, followed by  Ortho & Podiatry...          Problem List:  MACULAR DEGENERATION (ICD-362.50) - on OCUVITES daily... s/p laser surg by DrRankin... she cannot see well enough to read... they tried Bilberry Juice as heard on the [facility]- it's helping... ~  3/10: had f/u w/ DrRankin- stabilized... nothing else he can do... f/u 15yr. ~  3/11:  had f/u DrRankin> senile macular degen OU (worsening), vitreous detach OS (stable), bilat Drusen...    PULMONARY NODULE (ICD-518.89) - RUL nodule, eval 2007 w/ bx = granuloma...  ~  Serial CXRs have been stable... ~  CXR 3/11 showed chr changes & calcif granulomas, prob calcif of LAD seen on lat. ~  CXR 5/12 w/ Hosp for LLL infiltrate, improved serially w/ antibiotic rx.  HYPERTENSION (ICD-401.9) - she takes ASA 81mg /d, prev Aten ch to COREG 3.125mg Bid, LOSARTAN 25mg /d, & prev LASIX40 ch to 20mg /d, K20 added 1/d... BP=116/64, no postural changes, tol meds well; denies HA, CP, palpit, change in dyspnea, edema, etc. ~  9/11:  she received letter from [facility] stating she should be on ACE or ARB Rx> add Losartan 25mg /d... ~  5/12:  Hosp by [facility] w/ LLL pneumonia, UTI, CHF/ DD & meds were changed/ adjusted> Losartan & Atenolol stopped, Coreg added, Lasix decreased KCl added. ~  7/12:  1st post hosp (5/12) visit & meds reviewed & reconciled ~  11/12:  on Coreg3.125Bid, Losartan25, Lasix20, K20; BP= 124/80 & she denies HA, CP, palpit, ch in SOB or edema...  CONGESTIVE HEART FAILURE (ICD-428.0) - on above meds... ~  2DEcho 6/06 showed biatrial enlargement, mild MR/TR, norm LVF... ~  labs 3/11 showed BNP= 189... ~  Labs from 5/12 hosp reviewed in [place]...  ATRIAL FIBRILLATION, CHRONIC (ICD-427.31) - prev on Coumadin, pt & family decided to stop Coumadin in 2007, & now on ASA daily... rate control strategy w/ Aten (now COREG)...  VENOUS INSUFFICIENCY (ICD-459.81) - on low sodium diet, and Lasix... no edema...  HYPERCHOLESTEROLEMIA (ICD-272.0) - on PRAVACHOL 40mg Qhs and tol  well... ~  FLP 11/08 showed TChol 151, TG 152, HDL 38, LDL 82... > continue med & diet efforts. ~  FLP 3/09 shows TChol 206, TG 332, HDL 40, LDL 97... > cont same, get wt down! ~  FLP 3/10 showed TChol 172, TG 214, HDL 43, LDL 92... > improved- continue same. ~  FLP 3/11 showed TChol 167, TG 236, HDL 46, LDL 83 ~  FLP 9/11 showed TChol 144, TG 212, HDL 36, LDL 72... reminded of low fat diet rec... ~  11/12: pt needs to ret for FLP...  DM (ICD-250.00) - prev on Metform500bid, stopped 5/12 Hosp & ch to GLIPIZIDE XL 2.5mg  Qam... ~  labs 11/08 showed FBS=182, HgA1c=7.1... >  continue attempts at diet control. ~  labs 3/09 showed  FBS=178, HgA1c=7.2... >she will need to start meds: Metformin ER 500mg Qam. ~  labs 7/09 showed BS= 138, HgA1c= 6.7.Marland KitchenMarland Kitchenrec- keep same.  ~  labs 3/10 (wt=162#) showed 139, HgA1c= 6.6... > continue same meds. ~  labs 9/10 (wt=175#) showed BS= 158, A1c= 6.8.Marland KitchenMarland Kitchen needs better diet. ~  labs 3/11 (wt=179#) showed BS= 155, A1c= 7.6.Marland KitchenMarland Kitchen may need incr meds! get wt down. ~  labs 9/11 (wt=173#) showed BS= 166, A1c= 8.0.Marland KitchenMarland Kitchen worsening A1c- rec incr Metform to 2/d... ~  Urine microalb 9/11 = neg... ~  Labs 5/12 Hosp reviewed in EChart> off Metform, on Glipizide 2.5mg /d... ~  Labs 7/12 in office on Glip2.5 showed BS= 101, A1c= 6.7.Marland KitchenMarland Kitchen rec to continue same. ~  11/12:  daugh states BS at home all ~100 on the Glip2.5 daily; needs to ret for labs & A1c.  GERD (ICD-530.81) - on PRILOSEC 20mg /d, doing well without heartburn, n/v, etc... Hx HH, GERD, and prev DU... last EGD was 7/02 showing HH, reflux, stricture, gastric ulcer... she has refused colonoscopies in the past...  UTI'S, HX OF (ICD-V13.00) - hx UTI's, s/p hysterectomy, prev bladder surg... ~  8/11:  ER visit after fall w/ UTI Rx'd w/ Macrobid... ~  Urinary incont/ bladder prob followed by DrTannenbaum...  DEGENERATIVE JOINT DISEASE (ICD-715.90)  - severe arthritis w/ prev right TKR 1990... followed by DrGioffre, DrBednarz, DrSikora  (hammer toes)- not willing to consider more surg... uses Vicodin 2-3 per day as needed, shots not helpful in the past... leg discomfort may be neuropathy (but monofilament test is normal)- on Neurontin Rx taking 300mg - 2Qhs... ~  she ambulates w/ walker, she has had phys therapy & does chair exercises, she has knee braces, etc...  VITAMIN D DEFICIENCY (ICD-268.9) - not currently taking any supplement. ~  labs 7/09 showed Vit D level = 11... rec> start Vit D 50000 u weekly (but she stopped after 16mo). ~  labs 3/10 showed Vit D level = 30... rec> change to 2000 u daily.  ABNORMALITY OF GAIT (ICD-781.2) & Frequent FALLS>  ~  9/11:  she fell 2wks ago- hit occiput w/ hematoma on scalp> ER eval reviewed: lost balance; fell into bathtub; no LOC;  labs- OK, BS=180, +UTI given macrobid;  CT Brain= atrophy, chr microvasc dis, NAD;  CT CSpine= multilevel spondylosis & suboccipital hematoma in soft tissues w/o fx etc...   ~  11/11:  she fell yest- hit right occiput on commode w/ bruise but no hematoma; didn't go to the ER; no focal neuro deficits & we discussed checking CT Brain to be sure no subdural etc... ~  11/12:  She continues weak, gait abn, but manages her own ADLs ok...  ANXIETY (ICD-300.00  DERMATITIS - uses generic LIDEX E cream as needed...  Health Maintenance:  she gets the yearly Flu vaccine each Aut[employer];  and she had PNEUMOVAX ~2005 at [facility], she says.   Past Surgical History  Procedure Date  . Cholecystectomy 6/07    Dr. Corliss Skains  . Total abdominal hysterectomy   . Bladder surgery   . Total knee arthroplasty 1990    right - Dr. Darrelyn Hillock    Outpatient Encounter Prescriptions as of 10/25/2011  Medication Sig Dispense Refill  . acetaminophen (TYLENOL) 325 MG tablet as needed - alternates this with hydrocodone       . carvedilol (COREG) 3.125 MG tablet Take 1 tablet (3.125 mg total) by mouth 2 (two) times daily with a meal.  30  tablet  11  . Cholecalciferol (VITAMIN D) 2000 UNITS CAPS 1  capsule by mouth once daily       . clobetasol (TEMOVATE) 0.05 % cream Apply topically 2 (two) times daily.  30 g  5  . furosemide (LASIX) 20 MG tablet Take 1 tablet (20 mg total) by mouth daily.  30 tablet  11  . glipiZIDE (GLUCOTROL XL) 2.5 MG 24 hr tablet Take 1 tablet (2.5 mg total) by mouth daily.  30 tablet  11  . meclizine (ANTIVERT) 25 MG tablet Take 1/2 - 1 tab by mouth every 6 hours as needed for dizziness       . Multiple Vitamins-Minerals (OCUVITE PO) 2 tabs by mouth once daily       . NEURONTIN 300 MG capsule TAKE 2 CAPSULES AT BEDTIME.  60 each  3  . omeprazole (PRILOSEC) 20 MG capsule Take 1 capsule by mouth 30 minutes before first meal of the day       . potassium chloride SA (K-DUR,KLOR-CON) 20 MEQ tablet Take 1 tablet (20 mEq total) by mouth daily.  30 tablet  11  . pravastatin (PRAVACHOL) 40 MG tablet Take 1 tablet by mouth at bedtime.  30 tablet  11  . VICODIN 5-500 MG per tablet TAKE 1 TABLET EVERY 4-6 HOURS AS NEEDED FOR PAIN. DO NOT EXCEED 3 PERDAY. MAY MAKE DROWSY.  90 each  5    Allergies  Allergen Reactions  . Celecoxib     GI discomfort  . Diclofenac Sodium     REACTION: causing burining sensation after use    Current Medications, Allergies, Past Medical History, Past Surgical History, Family History, and Social History were reviewed in [employer] record.    Review of Systems    See HPI - all other systems neg except as noted...  The patient complains of vision loss, decreased hearing, dyspnea on exertion, muscle weakness, and difficulty walking.  The patient denies anorexia, fever, weight loss, weight gain, hoarseness, chest pain, syncope, peripheral edema, prolonged cough, headaches, hemoptysis, abdominal pain, melena, hematochezia, severe indigestion/heartburn, hematuria, incontinence, suspicious skin lesions, transient blindness, depression, unusual weight change, abnormal bleeding, enlarged lymph nodes, and angioedema.   Objective:    Physical Exam     WD, WN, 77 y/o WF chr ill appearing but in NAD... GENERAL:  Alert & oriented; pleasant & cooperative... sm bruise right occiput, no hematoma. HEENT:  [place]/AT, EOM-wnl, poor vision from macular degen; EACs-clear, TMs-wnl, NOSE-clear, THROAT-clear w/ dry MM's. NECK:  Supple w/ fairROM; no JVD; normal carotid impulses w/o bruits; no thyromegaly or nodules palpated; no lymphadenopathy. CHEST:  Clear to P & A; without wheezes/ rales/ or rhonchi. HEART:  irreg, gr 1/6 SEM without rubs or gallops detected... ABDOMEN:  Soft & nontender; normal bowel sounds; no organomegaly or masses detected. EXT:  severe arthritic changes & rightTKR; no varicose veins/ +venous insuffic/ tr edema. NEURO:  CN's intact; +gait abn, no focal neuro deficits x sl decr sensation in LE's... DERM:  No lesions noted; no rash etc...  RADIOLOGY DATA:  Reviewed in the EPIC EMR & discussed w/ the patient...  LABORATORY DATA:  Reviewed in the EPIC EMR & discussed w/ the patient...   Assessment & Plan:   HBP/ CHF (DD)/ AFib>  meds were changed, adjusted, & now reconciled betw prev office, hosp, NH, & back home;  Asked to bring all bottles to office for review...  CHOL>  Continue Prev40 Qhs and take regularly, needs to ret  for FLP...  DM>  meds were changed, adjusted, & now reconciled since her prev Hosp;  Off Metform & now on Glipizide (low dose) w/ BS 101 & A1c 6.7; reminded to eat well & regularly to avoid hypogly on the sulfonylurea rx started in last hosp (NOTE: Creat=0.9 & she could certainly return to Metformin therapy if needed)...  GERD>  Stable on PPI rx daily...  UTIs>  Treated during the 5/12 hosp & NH stay; she had prob w/ bladder & followed by DrTannenbaum...  DJD/ Gait Abn/ etc>  She is getting home therapy...  Dermatitis>  Try the Clobetasol cream...  Other medical problems as noted....   Patient's Medications  New Prescriptions   No medications on file  Previous Medications    ACETAMINOPHEN (TYLENOL) 325 MG TABLET    as needed - alternates this with hydrocodone    CARVEDILOL (COREG) 3.125 MG TABLET    Take 1 tablet (3.125 mg total) by mouth 2 (two) times daily with a meal.   CHOLECALCIFEROL (VITAMIN D) 2000 UNITS CAPS    1 capsule by mouth once daily    CLOBETASOL (TEMOVATE) 0.05 % CREAM    Apply topically 2 (two) times daily.   FUROSEMIDE (LASIX) 20 MG TABLET    Take 1 tablet (20 mg total) by mouth daily.   GLIPIZIDE (GLUCOTROL XL) 2.5 MG 24 HR TABLET    Take 1 tablet (2.5 mg total) by mouth daily.   MECLIZINE (ANTIVERT) 25 MG TABLET    Take 1/2 - 1 tab by mouth every 6 hours as needed for dizziness    MULTIPLE VITAMINS-MINERALS (OCUVITE PO)    2 tabs by mouth once daily    NEURONTIN 300 MG CAPSULE    TAKE 2 CAPSULES AT BEDTIME.   OMEPRAZOLE (PRILOSEC) 20 MG CAPSULE    Take 1 capsule by mouth 30 minutes before first meal of the day    POTASSIUM CHLORIDE SA (K-DUR,KLOR-CON) 20 MEQ TABLET    Take 1 tablet (20 mEq total) by mouth daily.   PRAVASTATIN (PRAVACHOL) 40 MG TABLET    Take 1 tablet by mouth at bedtime.   VICODIN 5-500 MG PER TABLET    TAKE 1 TABLET EVERY 4-6 HOURS AS NEEDED FOR PAIN. DO NOT EXCEED 3 PERDAY. MAY MAKE DROWSY.  Modified Medications   No medications on file  Discontinued Medications   No medications on file

## 2011-10-25 NOTE — Patient Instructions (Signed)
Today we updated your med list in our EPIC system...    Continue your current medications the same...  Stay as active as possible and be careful...  Call for any questions...  Let's plan a follow up visit in 4-6 months w/ fasting blood work at that time.Marland KitchenMarland Kitchen

## 2011-11-24 ENCOUNTER — Encounter: Payer: Self-pay | Admitting: Pulmonary Disease

## 2011-11-30 ENCOUNTER — Other Ambulatory Visit: Payer: Self-pay | Admitting: Pulmonary Disease

## 2012-01-18 ENCOUNTER — Other Ambulatory Visit: Payer: Self-pay | Admitting: Pulmonary Disease

## 2012-01-24 ENCOUNTER — Inpatient Hospital Stay (HOSPITAL_COMMUNITY)
Admission: EM | Admit: 2012-01-24 | Discharge: 2012-01-30 | DRG: 948 | Disposition: A | Discharge status: Skilled Nursing Facility | Payer: Medicare Other | Attending: Internal Medicine | Admitting: Internal Medicine

## 2012-01-24 ENCOUNTER — Emergency Department (HOSPITAL_COMMUNITY): Payer: Medicare Other

## 2012-01-24 ENCOUNTER — Encounter (HOSPITAL_COMMUNITY): Payer: Self-pay | Admitting: *Deleted

## 2012-01-24 DIAGNOSIS — I4891 Unspecified atrial fibrillation: Secondary | ICD-10-CM

## 2012-01-24 DIAGNOSIS — F411 Generalized anxiety disorder: Secondary | ICD-10-CM

## 2012-01-24 DIAGNOSIS — I5189 Other ill-defined heart diseases: Secondary | ICD-10-CM

## 2012-01-24 DIAGNOSIS — R5381 Other malaise: Principal | ICD-10-CM | POA: Diagnosis present

## 2012-01-24 DIAGNOSIS — K219 Gastro-esophageal reflux disease without esophagitis: Secondary | ICD-10-CM

## 2012-01-24 DIAGNOSIS — N390 Urinary tract infection, site not specified: Secondary | ICD-10-CM | POA: Diagnosis present

## 2012-01-24 DIAGNOSIS — B952 Enterococcus as the cause of diseases classified elsewhere: Secondary | ICD-10-CM | POA: Diagnosis present

## 2012-01-24 DIAGNOSIS — H3530 Unspecified macular degeneration: Secondary | ICD-10-CM

## 2012-01-24 DIAGNOSIS — Z79899 Other long term (current) drug therapy: Secondary | ICD-10-CM

## 2012-01-24 DIAGNOSIS — Z87448 Personal history of other diseases of urinary system: Secondary | ICD-10-CM

## 2012-01-24 DIAGNOSIS — J984 Other disorders of lung: Secondary | ICD-10-CM

## 2012-01-24 DIAGNOSIS — E119 Type 2 diabetes mellitus without complications: Secondary | ICD-10-CM

## 2012-01-24 DIAGNOSIS — Z66 Do not resuscitate: Secondary | ICD-10-CM | POA: Diagnosis present

## 2012-01-24 DIAGNOSIS — E559 Vitamin D deficiency, unspecified: Secondary | ICD-10-CM

## 2012-01-24 DIAGNOSIS — H6120 Impacted cerumen, unspecified ear: Secondary | ICD-10-CM

## 2012-01-24 DIAGNOSIS — L309 Dermatitis, unspecified: Secondary | ICD-10-CM

## 2012-01-24 DIAGNOSIS — R269 Unspecified abnormalities of gait and mobility: Secondary | ICD-10-CM

## 2012-01-24 DIAGNOSIS — I5032 Chronic diastolic (congestive) heart failure: Secondary | ICD-10-CM | POA: Diagnosis present

## 2012-01-24 DIAGNOSIS — I509 Heart failure, unspecified: Secondary | ICD-10-CM

## 2012-01-24 DIAGNOSIS — M1990 Unspecified osteoarthritis, unspecified site: Secondary | ICD-10-CM

## 2012-01-24 DIAGNOSIS — I872 Venous insufficiency (chronic) (peripheral): Secondary | ICD-10-CM

## 2012-01-24 DIAGNOSIS — R531 Weakness: Secondary | ICD-10-CM

## 2012-01-24 DIAGNOSIS — I10 Essential (primary) hypertension: Secondary | ICD-10-CM

## 2012-01-24 DIAGNOSIS — N189 Chronic kidney disease, unspecified: Secondary | ICD-10-CM | POA: Diagnosis present

## 2012-01-24 DIAGNOSIS — Z96659 Presence of unspecified artificial knee joint: Secondary | ICD-10-CM

## 2012-01-24 DIAGNOSIS — E7800 Pure hypercholesterolemia, unspecified: Secondary | ICD-10-CM

## 2012-01-24 DIAGNOSIS — G8929 Other chronic pain: Secondary | ICD-10-CM | POA: Diagnosis present

## 2012-01-24 DIAGNOSIS — I129 Hypertensive chronic kidney disease with stage 1 through stage 4 chronic kidney disease, or unspecified chronic kidney disease: Secondary | ICD-10-CM | POA: Diagnosis present

## 2012-01-24 LAB — COMPREHENSIVE METABOLIC PANEL
AST: 35 U/L (ref 0–37)
BUN: 15 mg/dL (ref 6–23)
CO2: 28 mEq/L (ref 19–32)
Calcium: 9.8 mg/dL (ref 8.4–10.5)
Chloride: 96 mEq/L (ref 96–112)
Creatinine, Ser: 1.19 mg/dL — ABNORMAL HIGH (ref 0.50–1.10)
GFR calc Af Amer: 42 mL/min — ABNORMAL LOW (ref >90)
GFR calc non Af Amer: 36 mL/min — ABNORMAL LOW (ref >90)
Glucose, Bld: 151 mg/dL — ABNORMAL HIGH (ref 70–99)
Total Bilirubin: 0.5 mg/dL (ref 0.3–1.2)

## 2012-01-24 LAB — POCT I-STAT TROPONIN I: Troponin i, poc: 0.02 ng/mL (ref 0.00–0.08)

## 2012-01-24 LAB — CBC
HCT: 45.5 % (ref 36.0–46.0)
Hemoglobin: 15.8 g/dL — ABNORMAL HIGH (ref 12.0–15.0)
MCV: 90.8 fL (ref 78.0–100.0)
RDW: 13.8 % (ref 11.5–15.5)
WBC: 8.1 10*3/uL (ref 4.0–10.5)

## 2012-01-24 LAB — URINE MICROSCOPIC-ADD ON

## 2012-01-24 LAB — DIFFERENTIAL
Basophils Absolute: 0 10*3/uL (ref 0.0–0.1)
Eosinophils Relative: 0 % (ref 0–5)
Lymphocytes Relative: 19 % (ref 12–46)
Monocytes Absolute: 1.6 10*3/uL — ABNORMAL HIGH (ref 0.1–1.0)
Monocytes Relative: 19 % — ABNORMAL HIGH (ref 3–12)
Neutro Abs: 5 10*3/uL (ref 1.7–7.7)

## 2012-01-24 LAB — URINALYSIS, ROUTINE W REFLEX MICROSCOPIC
Glucose, UA: NEGATIVE mg/dL
Ketones, ur: NEGATIVE mg/dL
Leukocytes, UA: NEGATIVE
Nitrite: NEGATIVE
Protein, ur: 30 mg/dL — AB
Urobilinogen, UA: 0.2 mg/dL (ref 0.0–1.0)

## 2012-01-24 LAB — GLUCOSE, CAPILLARY

## 2012-01-24 LAB — PRO B NATRIURETIC PEPTIDE: Pro B Natriuretic peptide (BNP): 1545 pg/mL — ABNORMAL HIGH (ref 0–450)

## 2012-01-24 MED ORDER — HYDROCODONE-ACETAMINOPHEN 5-325 MG PO TABS
1.0000 | ORAL_TABLET | Freq: Once | ORAL | Status: AC
  Administered 2012-01-25: 1 via ORAL
  Filled 2012-01-24: qty 1

## 2012-01-24 MED ORDER — FUROSEMIDE 10 MG/ML IJ SOLN
60.0000 mg | Freq: Once | INTRAMUSCULAR | Status: AC
  Administered 2012-01-24: 60 mg via INTRAVENOUS
  Filled 2012-01-24: qty 6

## 2012-01-24 NOTE — ED Notes (Signed)
Glucose 151

## 2012-01-24 NOTE — ED Notes (Signed)
Pt from home.  Family unable to ambulate pt tonight.  Pt had urinary incontinence.  Pt assisted to use the bedpan.  No distress noted.  Urine malodorous, pt had difficulty urinating, small stream.

## 2012-01-24 NOTE — ED Provider Notes (Signed)
History     CSN: [id number]  Arrival date & time 01/24/12  2029   First MD Initiated Contact with Patient 01/24/12 2125      Chief Complaint  Patient presents with  . Weakness    (Consider location/radiation/quality/duration/timing/severity/associated sxs/prior treatment) HPI History by the patient and the patient's daughters. Hx by the patient limited secondary to poor hearing and poor historian.  78 year old female with a history of CHF and A. fib by family secondary to generalized weakness. Patient lives with her daughter he states that the patient generally walks with a walker at baseline but was unable to stand earlier today. Symptoms described as relatively acute in onset, severe, and constant.  She states that the patient was unable to get of bed or stand from her wheelchair without significant assistance. Patient has had mild associated cough over the past few days with appreciated elevated blood pressure and occasional episodes of incontinence over the past week which is increased from baseline. Patient him he denies associated fever, vomiting, diarrhea, decreased by mouth intake, focal weakness, numbness, slurred speech, or chest pain. Patient has had one similar episode about a year ago, which was reportedly associated with acute CHF exacerbation. Family has noticed mild increased LE edema recently.  Patient has not been seen recently for her current complaints.   Past Medical History  Diagnosis Date  . Macular degeneration (senile) of retina, unspecified   . Pulmonary nodule   . Unspecified essential hypertension   . Congestive heart failure, unspecified   . Atrial fibrillation   . Unspecified venous (peripheral) insufficiency   . Pure hypercholesterolemia   . Type II or unspecified type diabetes mellitus without mention of complication, not stated as uncontrolled   . GERD (gastroesophageal reflux disease)   . Personal history of unspecified urinary disorder   . DJD  (degenerative joint disease)   . Unspecified vitamin D deficiency   . Abnormality of gait   . Anxiety state, unspecified     Past Surgical History  Procedure Date  . Cholecystectomy 6/07    Dr. Corliss Skains  . Total abdominal hysterectomy   . Bladder surgery   . Total knee arthroplasty 1990    right - Dr. Darrelyn Hillock    Family History  Problem Relation Age of Onset  . Rheum arthritis Father   . Breast cancer Sister   . Liver cancer Mother   . Heart attack Father   . Heart disease Sister   . Heart disease Sister   . Diabetes Mother     History  Substance Use Topics  . Smoking status: Never Smoker   . Smokeless tobacco: Not on file  . Alcohol Use: No    OB History    Grav Para Term Preterm Abortions TAB SAB Ect Mult Living                  Review of Systems  Constitutional: Negative for fever, chills, diaphoresis and appetite change.  HENT: Negative for congestion, sore throat and rhinorrhea.   Eyes: Negative for pain and visual disturbance.  Respiratory: Positive for cough. Negative for shortness of breath and wheezing.   Cardiovascular: Negative for chest pain and palpitations.  Gastrointestinal: Negative for nausea, vomiting, abdominal pain, diarrhea and blood in stool.  Genitourinary: Positive for difficulty urinating (occasional incontinence). Negative for dysuria and hematuria.  Musculoskeletal: Negative for back pain and gait problem.  Skin: Negative for rash and wound.  Neurological: Positive for weakness (generalized). Negative for dizziness, speech difficulty, numbness  and headaches.  Psychiatric/Behavioral: Negative for confusion and agitation.  All other systems reviewed and are negative.    Allergies  Celecoxib and Diclofenac sodium  Home Medications   Current Outpatient Rx  Name Route Sig Dispense Refill  . ACETAMINOPHEN 325 MG PO TABS  as needed - alternates this with hydrocodone     . CARVEDILOL 3.125 MG PO TABS Oral Take 1 tablet (3.125 mg total) by  mouth 2 (two) times daily with a meal. 30 tablet 11  . VITAMIN D 2000 UNITS PO CAPS  1 capsule by mouth once daily     . CLOBETASOL PROPIONATE 0.05 % EX CREA Topical Apply topically 2 (two) times daily. 30 g 5  . FUROSEMIDE 20 MG PO TABS Oral Take 1 tablet (20 mg total) by mouth daily. 30 tablet 11  . GLIPIZIDE ER 2.5 MG PO TB24 Oral Take 1 tablet (2.5 mg total) by mouth daily. 30 tablet 11  . MECLIZINE HCL 25 MG PO TABS  Take 1/2 - 1 tab by mouth every 6 hours as needed for dizziness     . OCUVITE PO  2 tabs by mouth once daily     . NEURONTIN 300 MG PO CAPS  TAKE 2 CAPSULES AT BEDTIME. 60 each 5  . OMEPRAZOLE 20 MG PO CPDR  Take 1 capsule by mouth 30 minutes before first meal of the day     . POTASSIUM CHLORIDE CRYS ER 20 MEQ PO TBCR Oral Take 1 tablet (20 mEq total) by mouth daily. 30 tablet 11  . PRAVASTATIN SODIUM 40 MG PO TABS  Take 1 tablet by mouth at bedtime. 30 tablet 11  . TRAVEL SICKNESS 25 MG PO CHEW  TAKE 1/2 TO 1 TABLET EVERY 6 HOURS AS NEEDED FOR DIZZINESS. 50 each 0  . VICODIN 5-500 MG PO TABS  TAKE 1 TABLET EVERY 4-6 HOURS AS NEEDED FOR PAIN. DO NOT EXCEED 3 PERDAY. MAY MAKE DROWSY. 90 each 5    BP 191/106  Pulse 115  Temp(Src) 99.5 F (37.5 C) (Oral)  Resp 16  SpO2 93%  Physical Exam  Nursing note and vitals reviewed. Constitutional: She is oriented to person, place, and time.       Mildly obese, poor hearing, alert, oriented   HENT:  Head: Normocephalic and atraumatic.  Right Ear: External ear normal.  Left Ear: External ear normal.  Nose: Nose normal.  Mouth/Throat: Oropharynx is clear and moist.  Eyes: Conjunctivae and EOM are normal. Pupils are equal, round, and reactive to light.  Neck: Normal range of motion. Neck supple.  Cardiovascular: Regular rhythm and intact distal pulses.  Tachycardia present.   Murmur (mild systolic on the Lt) heard. Pulmonary/Chest: Effort normal. No respiratory distress. She has rales (diffusely).  Abdominal: Soft. Bowel sounds  are normal. There is no tenderness.       obese  Musculoskeletal: Normal range of motion. She exhibits edema (1-2+ edema bilaterally).  Neurological: She is alert and oriented to person, place, and time.  Skin: Skin is warm and dry. No rash noted. She is not diaphoretic.  Psychiatric: She has a normal mood and affect. Judgment normal.    ED Course  Procedures (including critical care time)   Date: 01/24/2012  Rate: 119  Rhythm: a.fib with rapid rate  QRS Axis: normal QRS but LAD  Intervals: normal  ST/T Wave abnormalities: normal  Conduction Disutrbances: none  Old EKG Reviewed:  Prior on 04/19/11 also with a.fib and LAD but rate 85  Labs Reviewed  CBC - Abnormal; Notable for the following:    Hemoglobin 15.8 (*)    Platelets 139 (*)    All other components within normal limits  DIFFERENTIAL - Abnormal; Notable for the following:    Monocytes Relative 19 (*)    Monocytes Absolute 1.6 (*)    All other components within normal limits  COMPREHENSIVE METABOLIC PANEL - Abnormal; Notable for the following:    Glucose, Bld 151 (*)    Creatinine, Ser 1.19 (*)    Albumin 3.4 (*)    GFR calc non Af Amer 36 (*)    GFR calc Af Amer 42 (*)    All other components within normal limits  PRO B NATRIURETIC PEPTIDE - Abnormal; Notable for the following:    Pro B Natriuretic peptide (BNP) 1545.0 (*)    All other components within normal limits  URINALYSIS, ROUTINE W REFLEX MICROSCOPIC - Abnormal; Notable for the following:    APPearance CLOUDY (*)    Protein, ur 30 (*)    All other components within normal limits  GLUCOSE, CAPILLARY - Abnormal; Notable for the following:    Glucose-Capillary 151 (*)    All other components within normal limits  URINE MICROSCOPIC-ADD ON - Abnormal; Notable for the following:    Squamous Epithelial / LPF MANY (*)    Bacteria, UA FEW (*)    All other components within normal limits  POCT I-STAT TROPONIN I  URINE CULTURE   Ct Head Wo  Contrast  01/24/2012  *RADIOLOGY REPORT*  Clinical Data: Weakness; hypertension.  CT HEAD WITHOUT CONTRAST  Technique:  Contiguous axial images were obtained from the base of the skull through the vertex without contrast.  Comparison: CT of the head performed 04/19/2011  Findings: There is no evidence of acute infarction, mass lesion, or intra- or extra-axial hemorrhage on CT.  Prominence of the ventricles and sulci reflects mild cortical volume loss.  Mild cerebellar atrophy is noted.  Scattered periventricular and subcortical white matter change likely reflects small vessel ischemic microangiopathy.  Chronic lacunar infarcts are suggested within the pons and left cerebellar hemisphere.  The fourth ventricle is within normal limits.  The basal ganglia are unremarkable in appearance.  The cerebral hemispheres demonstrate grossly normal gray-white differentiation.  No mass effect or midline shift is seen.  There is no evidence of fracture; visualized osseous structures are unremarkable in appearance.  The orbits are within normal limits. The paranasal sinuses and mastoid air cells are well-aerated.  No significant soft tissue abnormalities are seen.  IMPRESSION:  1.  No acute intracranial pathology seen on CT. 2.  Mild cortical volume loss and scattered small vessel ischemic microangiopathy.  Likely chronic lacunar infarcts within the pons and left cerebellar hemisphere.  Original Report Authenticated By: Tonia Ghent, M.D.   Dg Chest Port 1 View  01/24/2012  *RADIOLOGY REPORT*  Clinical Data: Diffuse rales.  Weakness.  History of CHF.  PORTABLE CHEST - 1 VIEW  Comparison: 04/27/2011.  Findings: Stable enlarged cardiac silhouette.  Clear lungs.  Stable minimal diffuse peribronchial thickening.  Diffuse osteopenia. Lower thoracic spine degenerative changes.  IMPRESSION: Stable cardiomegaly and minimal chronic bronchitic changes.  No acute abnormality.  Original Report Authenticated By: Darrol Angel, M.D.      1. Generalized weakness   2. CHF exacerbation       MDM  78 year old female with history of CHF by family secondary to acute weakness generally and inability to stand today. No fever. Mild cough and  similar symptoms a year ago with associated CHF exacerbation.  Exam as above, hypertension, diffuse rales, and lower extremity edema.  EKG with a.fib with mild tachy rate.  X-ray without significant pulmonary edema or sign of pneumonia; however, BNP markedly elevated to 1545, concerning for CHF exacerbation, although somewhat atypical clinical picture, possibly contributed by a.fib.  Creatinine minimally changed from prior at 1.19 with normal lytes.  Normal troponin and WBC, and no sign of UTI.  No Sn of infection.  Lasix 60mg  IV given.  Pt admitted to [facility] without status change prior to admission.     Particia Lather, MD 01/25/12 [phone]

## 2012-01-24 NOTE — ED Notes (Signed)
Weakness since lunch with difficulty transferring from wheel chair. Pt is from home with family present. Pt has no other complaints but is hypertensive.

## 2012-01-25 ENCOUNTER — Encounter (HOSPITAL_COMMUNITY): Payer: Self-pay | Admitting: *Deleted

## 2012-01-25 DIAGNOSIS — I4891 Unspecified atrial fibrillation: Secondary | ICD-10-CM | POA: Diagnosis present

## 2012-01-25 DIAGNOSIS — R531 Weakness: Secondary | ICD-10-CM | POA: Diagnosis present

## 2012-01-25 DIAGNOSIS — I5189 Other ill-defined heart diseases: Secondary | ICD-10-CM | POA: Diagnosis present

## 2012-01-25 DIAGNOSIS — I359 Nonrheumatic aortic valve disorder, unspecified: Secondary | ICD-10-CM

## 2012-01-25 LAB — GLUCOSE, CAPILLARY: Glucose-Capillary: 165 mg/dL — ABNORMAL HIGH (ref 70–99)

## 2012-01-25 LAB — CBC
HCT: 43.1 % (ref 36.0–46.0)
Hemoglobin: 14.6 g/dL (ref 12.0–15.0)
MCH: 30.7 pg (ref 26.0–34.0)
MCHC: 33.9 g/dL (ref 30.0–36.0)
MCV: 90.5 fL (ref 78.0–100.0)
Platelets: 144 K/uL — ABNORMAL LOW (ref 150–400)
RBC: 4.76 MIL/uL (ref 3.87–5.11)
RDW: 13.8 % (ref 11.5–15.5)
WBC: 6.4 K/uL (ref 4.0–10.5)

## 2012-01-25 LAB — CARDIAC PANEL(CRET KIN+CKTOT+MB+TROPI)
CK, MB: 2.9 ng/mL (ref 0.3–4.0)
Relative Index: INVALID (ref 0.0–2.5)
Troponin I: <0.3 ng/mL (ref <0.30)

## 2012-01-25 LAB — BASIC METABOLIC PANEL
Calcium: 9.1 mg/dL (ref 8.4–10.5)
GFR calc Af Amer: 39 mL/min — ABNORMAL LOW (ref >90)
GFR calc non Af Amer: 34 mL/min — ABNORMAL LOW (ref >90)
Glucose, Bld: 158 mg/dL — ABNORMAL HIGH (ref 70–99)
Sodium: 137 mEq/L (ref 135–145)

## 2012-01-25 LAB — TSH: TSH: 1.417 u[IU]/mL (ref 0.350–4.500)

## 2012-01-25 MED ORDER — DOCUSATE SODIUM 100 MG PO CAPS
100.0000 mg | ORAL_CAPSULE | Freq: Two times a day (BID) | ORAL | Status: DC
  Administered 2012-01-25 – 2012-01-30 (×11): 100 mg via ORAL
  Filled 2012-01-25 (×12): qty 1

## 2012-01-25 MED ORDER — ONDANSETRON HCL 4 MG PO TABS: 4.0000 mg | ORAL_TABLET | Freq: Four times a day (QID) | ORAL | Status: DC | PRN

## 2012-01-25 MED ORDER — METOPROLOL TARTRATE 1 MG/ML IV SOLN: 5.0000 mg | INTRAVENOUS | Status: DC | PRN

## 2012-01-25 MED ORDER — HYDROCODONE-ACETAMINOPHEN 5-325 MG PO TABS
1.0000 | ORAL_TABLET | Freq: Three times a day (TID) | ORAL | Status: DC | PRN
  Administered 2012-01-25 – 2012-01-29 (×3): 1 via ORAL
  Administered 2012-01-30: 2 via ORAL
  Filled 2012-01-25: qty 1
  Filled 2012-01-25 (×2): qty 2
  Filled 2012-01-25: qty 1

## 2012-01-25 MED ORDER — GLIPIZIDE ER 2.5 MG PO TB24
2.5000 mg | ORAL_TABLET | Freq: Every day | ORAL | Status: DC
  Administered 2012-01-25 – 2012-01-26 (×2): 2.5 mg via ORAL
  Filled 2012-01-25 (×3): qty 1

## 2012-01-25 MED ORDER — ASPIRIN 325 MG PO TABS
325.0000 mg | ORAL_TABLET | Freq: Every day | ORAL | Status: DC
  Administered 2012-01-25 – 2012-01-30 (×6): 325 mg via ORAL
  Filled 2012-01-25 (×6): qty 1

## 2012-01-25 MED ORDER — CARVEDILOL 3.125 MG PO TABS
3.1250 mg | ORAL_TABLET | Freq: Once | ORAL | Status: AC
  Administered 2012-01-25: 3.125 mg via ORAL
  Filled 2012-01-25: qty 1

## 2012-01-25 MED ORDER — HEPARIN SODIUM (PORCINE) 5000 UNIT/ML IJ SOLN
5000.0000 [IU] | Freq: Three times a day (TID) | INTRAMUSCULAR | Status: DC
  Administered 2012-01-25 – 2012-01-30 (×16): 5000 [IU] via SUBCUTANEOUS
  Filled 2012-01-25 (×19): qty 1

## 2012-01-25 MED ORDER — SODIUM CHLORIDE 0.9 % IJ SOLN
3.0000 mL | Freq: Two times a day (BID) | INTRAMUSCULAR | Status: DC
  Administered 2012-01-25 – 2012-01-29 (×10): 3 mL via INTRAVENOUS

## 2012-01-25 MED ORDER — SENNA 8.6 MG PO TABS
1.0000 | ORAL_TABLET | Freq: Two times a day (BID) | ORAL | Status: DC
  Administered 2012-01-25 – 2012-01-30 (×10): 8.6 mg via ORAL
  Filled 2012-01-25 (×12): qty 1

## 2012-01-25 MED ORDER — SIMVASTATIN 20 MG PO TABS
20.0000 mg | ORAL_TABLET | Freq: Every day | ORAL | Status: DC
  Administered 2012-01-25 – 2012-01-29 (×5): 20 mg via ORAL
  Filled 2012-01-25 (×6): qty 1

## 2012-01-25 MED ORDER — HYDROCODONE-ACETAMINOPHEN 5-325 MG PO TABS: 1.0000 | ORAL_TABLET | Freq: Once | ORAL | Status: DC

## 2012-01-25 MED ORDER — ONDANSETRON HCL 4 MG/2ML IJ SOLN: 4.0000 mg | Freq: Four times a day (QID) | INTRAMUSCULAR | Status: DC | PRN

## 2012-01-25 MED ORDER — CARVEDILOL 3.125 MG PO TABS
3.1250 mg | ORAL_TABLET | Freq: Two times a day (BID) | ORAL | Status: DC
  Administered 2012-01-25: 3.125 mg via ORAL
  Filled 2012-01-25 (×3): qty 1

## 2012-01-25 MED ORDER — CARVEDILOL 6.25 MG PO TABS
6.2500 mg | ORAL_TABLET | Freq: Two times a day (BID) | ORAL | Status: DC
  Administered 2012-01-25 – 2012-01-26 (×2): 6.25 mg via ORAL
  Filled 2012-01-25 (×4): qty 1

## 2012-01-25 MED ORDER — ACETAMINOPHEN 325 MG PO TABS: 650.0000 mg | ORAL_TABLET | Freq: Four times a day (QID) | ORAL | Status: DC | PRN

## 2012-01-25 MED ORDER — GABAPENTIN 600 MG PO TABS
600.0000 mg | ORAL_TABLET | Freq: Every day | ORAL | Status: DC
  Administered 2012-01-25 – 2012-01-29 (×5): 600 mg via ORAL
  Filled 2012-01-25 (×6): qty 1

## 2012-01-25 MED ORDER — ACETAMINOPHEN 650 MG RE SUPP: 650.0000 mg | Freq: Four times a day (QID) | RECTAL | Status: DC | PRN

## 2012-01-25 NOTE — Progress Notes (Signed)
   CARE MANAGEMENT NOTE 01/25/2012  Patient:  [name], [name]   Account Number:  [id number]  Date Initiated:  01/25/2012  Documentation initiated by:  Letha Cape  Subjective/Objective Assessment:   dx weakness, afib with rvr  admit- lives with daughter Gigi Gin  [phone].     Action/Plan:   continue with progression of care   Anticipated DC Date:  01/27/2012   Anticipated DC Plan:  HOME W HOME HEALTH SERVICES      DC Planning Services  CM consult      [facility] Choice  HOME HEALTH   Choice offered to / List presented to:  C-4 Adult Children        HH arranged  HH-2 PT  HH-4 NURSE'S AIDE      HH agency  [facility]   Status of service:  In process, will continue to follow Medicare Important Message given?   (If response is "NO", the following Medicare IM given date fields will be blank) Date Medicare IM given:   Date Additional Medicare IM given:    Discharge Disposition:    Per UR Regulation:    Comments:  PCP Alroy Dust  01/25/12 12:10 Letha Cape RN,BSN [phone] Patient lives with daughter, who is there 24 hrs/day. Patient has medication coverage and transportation. Physical therapy recs hhpt, daughter chose [facility] since they have worked with them before.  Referral made to [place] and Debbie notified.  Soc will begin 24-48 hrs post discharge.

## 2012-01-25 NOTE — Evaluation (Signed)
Physical Therapy Evaluation Patient Details Name: [name] MRN: [id number] DOB: 04-19-1911 Today's Date: 01/25/2012  Problem List:  Patient Active Problem List  Diagnoses  . DM  . VITAMIN D DEFICIENCY  . HYPERCHOLESTEROLEMIA  . ANXIETY  . MACULAR DEGENERATION  . HYPERTENSION  . ATRIAL FIBRILLATION, CHRONIC  . CONGESTIVE HEART FAILURE  . VENOUS INSUFFICIENCY  . PULMONARY NODULE  . GERD  . DEGENERATIVE JOINT DISEASE  . ABNORMALITY OF GAIT  . UTI'S, HX OF  . Cerumen impaction  . Dermatitis  . Urinary incontinence  . Atrial fibrillation with RVR  . Generalized weakness  . Diastolic dysfunction    Past Medical History:  Past Medical History  Diagnosis Date  . Macular degeneration (senile) of retina, unspecified   . Pulmonary nodule   . Unspecified essential hypertension   . Congestive heart failure, unspecified   . Atrial fibrillation   . Unspecified venous (peripheral) insufficiency   . Pure hypercholesterolemia   . Type II or unspecified type diabetes mellitus without mention of complication, not stated as uncontrolled   . GERD (gastroesophageal reflux disease)   . Personal history of unspecified urinary disorder   . DJD (degenerative joint disease)   . Unspecified vitamin D deficiency   . Abnormality of gait   . Anxiety state, unspecified    Past Surgical History:  Past Surgical History  Procedure Date  . Cholecystectomy 6/07    Dr. Corliss Skains  . Total abdominal hysterectomy   . Bladder surgery   . Total knee arthroplasty 1990    right - Dr. Darrelyn Hillock    PT Assessment/Plan/Recommendation PT Assessment Clinical Impression Statement: Pt admitted for generalized weakness. Pt presents today with decrease endurance and strength. Pt stated that yesterday she could not even get out of bed or walk. Pt was able to assist with both activities today. She needed mod assist for walking and a lot of cueing on progression of exercise. Pt states that she feels stronger today. Pt  lives at home with daughter and another daughter lives next door so pt is rarely at home alone. PT recommends HHPT upon D/C for follow up therapy services, pt and daughter both agree.  PT Recommendation/Assessment: Patient will need skilled PT in the acute care venue PT Problem List: Decreased strength;Decreased range of motion;Decreased activity tolerance;Decreased balance;Decreased mobility;Decreased coordination;Decreased knowledge of use of DME;Decreased safety awareness;Decreased knowledge of precautions PT Therapy Diagnosis : Difficulty walking;Abnormality of gait;Generalized weakness PT Plan PT Frequency: Min 3X/week PT Treatment/Interventions: DME instruction;Gait training;Stair training;Functional mobility training;Therapeutic activities;Balance training;Therapeutic exercise;Patient/family education PT Recommendation Follow Up Recommendations: Home health PT Equipment Recommended: None recommended by PT (pt has walker at home) PT Goals  Acute Rehab PT Goals PT Goal Formulation: With patient Pt will go Supine/Side to Sit: with modified independence;with HOB 0 degrees PT Goal: Supine/Side to Sit - Progress: Goal set today Pt will go Sit to Supine/Side: with modified independence;with HOB 0 degrees PT Goal: Sit to Supine/Side - Progress: Goal set today Pt will go Sit to Stand: Independently PT Goal: Sit to Stand - Progress: Goal set today Pt will go Stand to Sit: Independently PT Goal: Stand to Sit - Progress: Goal set today Pt will Transfer Bed to Chair/Chair to Bed: Independently PT Transfer Goal: Bed to Chair/Chair to Bed - Progress: Goal set today Pt will Ambulate: with modified independence;with least restrictive assistive device;51 - 150 feet PT Goal: Ambulate - Progress: Goal set today Pt will Perform Home Exercise Program: Independently PT Goal: Perform Home Exercise  Program - Progress: Goal set today  PT Evaluation Precautions/Restrictions  Precautions Precautions:  Fall Prior Functioning  Home Living Lives With: Daughter Receives Help From: Family (two daughters live in building) Type of Home: Apartment Home Layout: One level Home Access: Engineer, maintenance (IT) Shower/Tub: Walk-in shower Bathroom Accessibility: Yes How Accessible: Accessible via walker Home Adaptive Equipment: Shower chair with back;Walker - rolling;Straight cane Prior Function Level of Independence: Independent with basic ADLs;Independent with transfers;Requires assistive device for independence;Needs assistance with homemaking (used a RW to ambulate) Driving: No Vocation: Retired Producer, television/film/video: Awake/alert Overall Cognitive Status: Appears within functional limits for tasks assessed Sensation/Coordination Sensation Light Touch: Appears Intact Stereognosis: Not tested Hot/Cold: Not tested Proprioception: Not tested Coordination Gross Motor Movements are Fluid and Coordinated: Yes Fine Motor Movements are Fluid and Coordinated: Yes Extremity Assessment RUE Assessment RUE Assessment: Within Functional Limits LUE Assessment LUE Assessment: Within Functional Limits RLE Assessment RLE Assessment: Within Functional Limits LLE Assessment LLE Assessment: Within Functional Limits Mobility (including Balance) Bed Mobility Bed Mobility: Yes Supine to Sit: 4: Min assist;HOB flat Supine to Sit Details (indicate cue type and reason): needing assistance getting COM over BOS at end of exercise  Sitting - Scoot to Edge of Bed: 5: Supervision Sitting - Scoot to [place] of Bed Details (indicate cue type and reason): needing cueing on weight shifting Sit to Supine: 3: Mod assist;HOB flat Sit to Supine - Details (indicate cue type and reason): needing assistance getting LE into bed and scooting to center of bed, needing cueing on progression Transfers Transfers: Yes Sit to Stand: 4: Min assist;From bed Sit to Stand Details (indicate cue type and reason): needing  cueing on hand placement and progression of exercise Stand to Sit: 5: Supervision;To bed Stand to Sit Details: needing cueing on reaching back for bed Ambulation/Gait Ambulation/Gait: Yes Ambulation/Gait Assistance: 3: Mod assist Ambulation/Gait Assistance Details (indicate cue type and reason): pt needing cueing on staying close to walker and for progression of exercise Ambulation Distance (Feet): 40 Feet Assistive device: Rolling walker Gait Pattern: Decreased stride length;Decreased step length - right;Decreased step length - left;Trunk flexed Gait velocity: very slow Stairs: No Wheelchair Mobility Wheelchair Mobility: No  Balance Balance Assessed: Yes Dynamic Sitting Balance Dynamic Sitting - Balance Support: No upper extremity supported;Feet supported Dynamic Sitting - Level of Assistance: 4: Min assist Dynamic Sitting - Comments: pt needing assistance to stay upright during strength testing and reaching outside of BOS in sitting Static Standing Balance Static Standing - Balance Support: Bilateral upper extremity supported Static Standing - Level of Assistance: 4: Min assist End of Session PT - End of Session Equipment Utilized During Treatment: Gait belt Activity Tolerance: Patient tolerated treatment well Patient left: in bed;with call bell in reach;with family/visitor present Nurse Communication: Mobility status for transfers;Mobility status for ambulation General Behavior During Session: [facility] for tasks performed Cognition: [facility] for tasks performed  Elvera Bicker 01/25/2012, 10:57 AM

## 2012-01-25 NOTE — Progress Notes (Signed)
Utilization review completed.  

## 2012-01-25 NOTE — H&P (Signed)
PCP:   Michele Mcalpine, MD, MD  Confirmed with family   Chief Complaint:  Weakness  HPI: 78yoF with h/o AFib, DM/HTN, likely diastolic dysfunction, and other chronic age related  conditions presents with generalized weakness, found to be in AFib with RVR.   Pt was last admitted by [facility] in 04/2011 for complaints of feeling weak, productive cough,  increased DOE and orthopnea, BLE swelling, but with stable cardiomegaly on CXR. Echo at that time  showed LVH, normal EF. She was diuresed and discharged to SNF. There was also apparently PNA and  UTI treated with ABx.   Her daughter relates that her baseline is ambulatory with a walker for short distances, however  today she was unable to walk well so they put her in a wheelchair to go to the bathroom. However,  she was completely unable to get out of the wheelchair due to weakness. She was also "out of it"  and more lethargic. This seems to have been abruptly noted today, although her exercise tolerance  and ambulation has steadily declined over months to years. She also had and episode of incontince  while sitting in the wheelchair.   In the ED vitals significant for tachycardia to max 120, with preserved BP's. Labs with normal  chemistry panel, renal 15/1.19. Normal LFT's. BNP 1545 with negative Trop POC. Stable CBC. UA  without infection, UCx pending. CXR showed stable cardiomegaly with minimal chronic bronchitis  changes and nothing acute. CT head without anything acute, but with mild cortical volume loss,  scattered small vessel ischemic microangiopathy, likely chronic lacunar infarcts within the pons  and left cerebellar hemisphere. Pt has been given 60 mg IV Lasix and Vicodin.   ROS as above, otherwise also with a minimally productive cough. There's no reported SOB,  orthopnea, wheezing, BLE edema. They deny any chest pain now or in the recent past since last  admission. No fevers, chills, sweats, or GI issues of n/v/d/abd pain.    Past Medical History  Diagnosis Date  . Macular degeneration (senile) of retina, unspecified   . Pulmonary nodule   . Unspecified essential hypertension   . Congestive heart failure, unspecified   . Atrial fibrillation   . Unspecified venous (peripheral) insufficiency   . Pure hypercholesterolemia   . Type II or unspecified type diabetes mellitus without mention of complication, not stated as uncontrolled   . GERD (gastroesophageal reflux disease)   . Personal history of unspecified urinary disorder   . DJD (degenerative joint disease)   . Unspecified vitamin D deficiency   . Abnormality of gait   . Anxiety state, unspecified     Past Surgical History  Procedure Date  . Cholecystectomy 6/07    Dr. Corliss Skains  . Total abdominal hysterectomy   . Bladder surgery   . Total knee arthroplasty 1990    right - Dr. Darrelyn Hillock   Medications:  HOME MEDS: Reconciled by name with daughter  Prior to Admission medications   Medication Sig Start Date End Date Taking? Authorizing Provider  acetaminophen (TYLENOL) 325 MG tablet as needed - alternates this with hydrocodone    Yes Historical Provider, MD  BILBERRY, VACCINIUM MYRTILLUS, PO Take 320 mg by mouth daily.   Yes Historical Provider, MD  carvedilol (COREG) 3.125 MG tablet Take 3.125 mg by mouth 2 (two) times daily with a meal. 07/05/11  Yes Michele Mcalpine, MD  Cholecalciferol (VITAMIN D) 2000 UNITS CAPS Take 2,000 Units by mouth daily. 1 capsule by mouth once daily  Yes Historical Provider, MD  furosemide (LASIX) 20 MG tablet Take 1 tablet (20 mg total) by mouth daily. 08/24/11  Yes Michele Mcalpine, MD  gabapentin (NEURONTIN) 300 MG capsule Take 600 mg by mouth at bedtime.   Yes Historical Provider, MD  glipiZIDE (GLUCOTROL XL) 2.5 MG 24 hr tablet Take 2.5 mg by mouth daily. 07/05/11  Yes Michele Mcalpine, MD  HYDROcodone-acetaminophen (VICODIN) 5-500 MG per tablet Take 1 tablet by mouth every 6 (six) hours as needed. For pain; do not exceed 3  tablets in a day   Yes Historical Provider, MD  meclizine (ANTIVERT) 25 MG tablet Take 12.5-25 mg by mouth every 6 (six) hours as needed. for dizziness   Yes Historical Provider, MD  omeprazole (PRILOSEC) 20 MG capsule Take 20 mg by mouth daily. Take 1 capsule by mouth 30 minutes before first meal of the day   Yes Historical Provider, MD  potassium chloride SA (K-DUR,KLOR-CON) 20 MEQ tablet Take 1 tablet (20 mEq total) by mouth daily. 07/05/11  Yes Michele Mcalpine, MD  pravastatin (PRAVACHOL) 40 MG tablet Take 1 tablet by mouth at bedtime. 07/28/11 07/27/12 Yes Michele Mcalpine, MD  Multiple Vitamins-Minerals (OCUVITE PO) Take 2 tablets by mouth daily.     Historical Provider, MD    Allergies:  Allergies  Allergen Reactions  . Celecoxib     GI discomfort  . Diclofenac Sodium     REACTION: causing burining sensation after use    Social History:   reports that she has never smoked. She does not have any smokeless tobacco history on file. She reports that she does not drink alcohol or use illicit drugs. Lives at home with daughter, still ambulatory with walker. Daughter [address]   Family History: Family History  Problem Relation Age of Onset  . Rheum arthritis Father   . Breast cancer Sister   . Liver cancer Mother   . Heart attack Father   . Heart disease Sister   . Heart disease Sister   . Diabetes Mother     Physical Exam: Filed Vitals:   01/25/12 0045 01/25/12 0112 01/25/12 0145 01/25/12 0215  BP: 135/65 144/68 134/77 113/88  Pulse: 110 110 107 105  Temp:      TempSrc:      Resp: 27 29 23 21   SpO2: 94% 96% 93% 94%   Blood pressure 113/88, pulse 105, temperature 98 F (36.7 C), temperature source Oral, resp. rate 21, SpO2 94.00%. Gen: Quite elderly appearing F in no distress, very hard of hearing but can answer questions  correctly when spoken to loudly. Appears overall well, no difficulty breathing, no accessory  muscle use HEENT: Pupil round on the left and  surgical on the right, irises/conjunctivae clear. Mouth/tongue  very dry appearing.  Lungs: Diffuse light rales and coarse breath sounds, but not overt or overwhelming, good air  movement. Bibasilar crackles that improved after a few breaths Heart: Irregular and tachycardic, but no frank murmurs or gallops Abd: Soft, NT ND, benign exam Extrem: Decent bulk for her age, normal tone. Warm and perfusing normally. BLE's without any  noticable pitting edema, RLE shin has a hyperpigmented brown macule Neuro: Alert, attnetive to conversation but hard of hearing. No slurred speech or facial droop.  Moves upper extremities spontaneously and BLE's have preserved strength -- able to hold her legs  off stretcher bilaterally, flex her knees (with arhtritis pain) and flex/extend her ankles with  pretty good strength for her age --  no true focal weakness.    Labs & Imaging Results for orders placed during the hospital encounter of 01/24/12 (from the past 48 hour(s))  URINALYSIS, ROUTINE W REFLEX MICROSCOPIC     Status: Abnormal   Collection Time   01/24/12  9:53 PM      Component Value Range Comment   Color, Urine YELLOW  YELLOW     APPearance CLOUDY (*) CLEAR     Specific Gravity, Urine 1.015  1.005 - 1.030     pH 7.5  5.0 - 8.0     Glucose, UA NEGATIVE  NEGATIVE (mg/dL)    Hgb urine dipstick NEGATIVE  NEGATIVE     Bilirubin Urine NEGATIVE  NEGATIVE     Ketones, ur NEGATIVE  NEGATIVE (mg/dL)    Protein, ur 30 (*) NEGATIVE (mg/dL)    Urobilinogen, UA 0.2  0.0 - 1.0 (mg/dL)    Nitrite NEGATIVE  NEGATIVE     Leukocytes, UA NEGATIVE  NEGATIVE    URINE MICROSCOPIC-ADD ON     Status: Abnormal   Collection Time   01/24/12  9:53 PM      Component Value Range Comment   Squamous Epithelial / LPF MANY (*) RARE     WBC, UA 0-2  <3 (WBC/hpf)    RBC / HPF 0-2  <3 (RBC/hpf)    Bacteria, UA FEW (*) RARE    CBC     Status: Abnormal   Collection Time   01/24/12  9:58 PM      Component Value Range Comment    WBC 8.1  4.0 - 10.5 (K/uL)    RBC 5.01  3.87 - 5.11 (MIL/uL)    Hemoglobin 15.8 (*) 12.0 - 15.0 (g/dL)    HCT 16.1  09.6 - 04.5 (%)    MCV 90.8  78.0 - 100.0 (fL)    MCH 31.5  26.0 - 34.0 (pg)    MCHC 34.7  30.0 - 36.0 (g/dL)    RDW 40.9  81.1 - 91.4 (%)    Platelets 139 (*) 150 - 400 (K/uL)   DIFFERENTIAL     Status: Abnormal   Collection Time   01/24/12  9:58 PM      Component Value Range Comment   Neutrophils Relative 61  43 - 77 (%)    Neutro Abs 5.0  1.7 - 7.7 (K/uL)    Lymphocytes Relative 19  12 - 46 (%)    Lymphs Abs 1.6  0.7 - 4.0 (K/uL)    Monocytes Relative 19 (*) 3 - 12 (%)    Monocytes Absolute 1.6 (*) 0.1 - 1.0 (K/uL)    Eosinophils Relative 0  0 - 5 (%)    Eosinophils Absolute 0.0  0.0 - 0.7 (K/uL)    Basophils Relative 0  0 - 1 (%)    Basophils Absolute 0.0  0.0 - 0.1 (K/uL)   COMPREHENSIVE METABOLIC PANEL     Status: Abnormal   Collection Time   01/24/12  9:58 PM      Component Value Range Comment   Sodium 136  135 - 145 (mEq/L)    Potassium 4.0  3.5 - 5.1 (mEq/L)    Chloride 96  96 - 112 (mEq/L)    CO2 28  19 - 32 (mEq/L)    Glucose, Bld 151 (*) 70 - 99 (mg/dL)    BUN 15  6 - 23 (mg/dL)    Creatinine, Ser 7.82 (*) 0.50 - 1.10 (mg/dL)    Calcium 9.8  8.4 -  10.5 (mg/dL)    Total Protein 7.7  6.0 - 8.3 (g/dL)    Albumin 3.4 (*) 3.5 - 5.2 (g/dL)    AST 35  0 - 37 (U/L) HEMOLYSIS AT THIS LEVEL MAY AFFECT RESULT   ALT 18  0 - 35 (U/L)    Alkaline Phosphatase 105  39 - 117 (U/L)    Total Bilirubin 0.5  0.3 - 1.2 (mg/dL)    GFR calc non Af Amer 36 (*) >90 (mL/min)    GFR calc Af Amer 42 (*) >90 (mL/min)   PRO B NATRIURETIC PEPTIDE     Status: Abnormal   Collection Time   01/24/12  9:58 PM      Component Value Range Comment   Pro B Natriuretic peptide (BNP) 1545.0 (*) 0 - 450 (pg/mL)   POCT I-STAT TROPONIN I     Status: Normal   Collection Time   01/24/12 10:17 PM      Component Value Range Comment   Troponin i, poc 0.02  0.00 - 0.08 (ng/mL)    Comment 3             GLUCOSE, CAPILLARY     Status: Abnormal   Collection Time   01/24/12 10:26 PM      Component Value Range Comment   Glucose-Capillary 151 (*) 70 - 99 (mg/dL)    Comment 1 Documented in Chart      Ct Head Wo Contrast  01/24/2012  *RADIOLOGY REPORT*  Clinical Data: Weakness; hypertension.  CT HEAD WITHOUT CONTRAST  Technique:  Contiguous axial images were obtained from the base of the skull through the vertex without contrast.  Comparison: CT of the head performed 04/19/2011  Findings: There is no evidence of acute infarction, mass lesion, or intra- or extra-axial hemorrhage on CT.  Prominence of the ventricles and sulci reflects mild cortical volume loss.  Mild cerebellar atrophy is noted.  Scattered periventricular and subcortical white matter change likely reflects small vessel ischemic microangiopathy.  Chronic lacunar infarcts are suggested within the pons and left cerebellar hemisphere.  The fourth ventricle is within normal limits.  The basal ganglia are unremarkable in appearance.  The cerebral hemispheres demonstrate grossly normal gray-white differentiation.  No mass effect or midline shift is seen.  There is no evidence of fracture; visualized osseous structures are unremarkable in appearance.  The orbits are within normal limits. The paranasal sinuses and mastoid air cells are well-aerated.  No significant soft tissue abnormalities are seen.  IMPRESSION:  1.  No acute intracranial pathology seen on CT. 2.  Mild cortical volume loss and scattered small vessel ischemic microangiopathy.  Likely chronic lacunar infarcts within the pons and left cerebellar hemisphere.  Original Report Authenticated By: Tonia Ghent, M.D.   Dg Chest Port 1 View  01/24/2012  *RADIOLOGY REPORT*  Clinical Data: Diffuse rales.  Weakness.  History of CHF.  PORTABLE CHEST - 1 VIEW  Comparison: 04/27/2011.  Findings: Stable enlarged cardiac silhouette.  Clear lungs.  Stable minimal diffuse peribronchial thickening.   Diffuse osteopenia. Lower thoracic spine degenerative changes.  IMPRESSION: Stable cardiomegaly and minimal chronic bronchitic changes.  No acute abnormality.  Original Report Authenticated By: Darrol Angel, M.D.   ECG: AFib to 119, LAD with LAFB. Narrow QRS, no ST deviations. T waves all normal. No change  compared to prior. Compared to last ECG, no change except rate.    04/2011 echo Study Conclusions - Left ventricle: The cavity size was normal. Wall thickness was   increased  in a pattern of mild LVH. There was mild focal basal   hypertrophy of the septum. Systolic function was normal. The   estimated ejection fraction was in the range of 60% to 65%. - Aortic valve: Trivial regurgitation. - Mitral valve: Calcified annulus. Mildly thickened leaflets . - Left atrium: The atrium was mildly dilated. - Pulmonary arteries: PA peak pressure: 42mm Hg (S)  Impression Present on Admission:  .Atrial fibrillation with RVR .Generalized weakness .Diastolic dysfunction .DM  78yoF with h/o AFib, DM/HTN, likely diastolic dysfunction, and other chronic age related  conditions presents with generalized weakness, found to be in AFib with RVR.   1. Generalized weakness: Not really sure exact etiology. Neuro issue considered, but CT head  negative and BLE's are actually quite strong. Does have RVR to the 110-120's, so this is  reasonable to try and control, see below. I'm not overwhelmed for frank heart failure, although  she does have documented diastolic dysfxn on last echo with an elevated pulmonary pressure, so  this could be contributing moderately -- however they deny any recent SOB, leg swelling,  orthopnea. Overarching all of this, there is also likely some element of chronic deconditioning  from being 78 years old. Finally, medication effect (geriatric polypharmacy) considered since she  is on beta blocker, diuretic, narcotic, neurontin.   There are no signs of infection and rest of CBC,  chemistry, LFT's look quite good. Cr is below  prior baseline.   - PT consultation, orthostatics on admission, TSH.  - As discussed with family, will not start changing her home medication regimen while admitted but  defer this to PCP Dr. Kriste Basque.   2. AFib with RVR: She is intentionally not on Coumadin as documented previously.  - Will continue carvedilol orally and start with small boluses of IV metoprolol  3. Diastolic dysfunction: I'm not impressed that she's in overt HF by CXR or exam, but some  element of congestion is reasonably contributing.   - Holding PO Lasix, get echo. She's s/p 60 mg IV Lasix, will hold on further aggressive diuresis  for now  5. Diabetes: Fairly well controlled at present, will continue to monitor and continue home  glipizide for now. Continue statin  6. Chronic pain, arthritis: continue neurontin, holding home vicodin, use as needed.  Holding all other non essential home meds   SubQ heparin  Telemetry, MC team 1 DNR, discussed with family and also documented last admission   Other plans as per orders.  Kaloni Bisaillon 01/25/2012, 2:48 AM

## 2012-01-25 NOTE — ED Provider Notes (Signed)
  I performed a history and physical examination of Kameryn S Fulmore and discussed her management with Dr. Lendell Caprice.  I agree with the history, physical, assessment, and plan of care, with the following exceptions: None  78yo F w AFIB, now w weakness.  Likely CHF exacerbation  Cardiac monitor: 110 afib, abnormal X-ray and CT was reviewed by me. ECG also reviewed by me, I agree with the interpretation  Ondrea Dow, Elvis Coil, MD 01/25/12 2205

## 2012-01-25 NOTE — ED Notes (Signed)
Pt resting.  Waiting on admitting MD.

## 2012-01-25 NOTE — Progress Notes (Signed)
  Echocardiogram 2D Echocardiogram has been performed.  Silvester Reierson Nira Retort 01/25/2012, 10:01 AM

## 2012-01-25 NOTE — Progress Notes (Signed)
Subjective: Patient admitted last night with a fib with RVR and weakness.  Patient much more alert and wanting to go home.  NO CP, no SOB    Objective: Vital signs in last 24 hours: Filed Vitals:   01/25/12 0343 01/25/12 0553 01/25/12 0554 01/25/12 0854  BP: 157/75 149/86 158/93 165/92  Pulse: 114 94 100 110  Temp: 98.6 F (37 C) 98.7 F (37.1 C)    TempSrc: Oral     Resp: 16 20    Weight:  75.388 kg (166 lb 3.2 oz)    SpO2: 95% 91%     Weight change:   Intake/Output Summary (Last 24 hours) at 01/25/12 0920 Last data filed at 01/25/12 0844  Gross per 24 hour  Intake    360 ml  Output   2150 ml  Net  -1790 ml    Physical Exam: General: Awake, Oriented, No acute distress. HEENT: EOMI. Neck: Supple CV: irregular Lungs: Clear to ascultation bilaterally Abdomen: Soft, Nontender, Nondistended, +bowel sounds. Ext: Good pulses. Trace edema.   Lab Results:  [facility] 01/25/12 0520 01/24/12 2158  NA 137 136  K 4.2 4.0  CL 95* 96  CO2 30 28  GLUCOSE 158* 151*  BUN 18 15  CREATININE 1.26* 1.19*  CALCIUM 9.1 9.8  MG -- --  PHOS -- --    Basename 01/24/12 2158  AST 35  ALT 18  ALKPHOS 105  BILITOT 0.5  PROT 7.7  ALBUMIN 3.4*   No results found for this basename: LIPASE:2,AMYLASE:2 in the last 72 hours  Basename 01/25/12 0520 01/24/12 2158  WBC 6.4 8.1  NEUTROABS -- 5.0  HGB 14.6 15.8*  HCT 43.1 45.5  MCV 90.5 90.8  PLT 144* 139*    Basename 01/25/12 0520  CKTOTAL 72  CKMB 2.9  CKMBINDEX --  TROPONINI <0.30   No components found with this basename: POCBNP:3 No results found for this basename: DDIMER:2 in the last 72 hours No results found for this basename: HGBA1C:2 in the last 72 hours No results found for this basename: CHOL:2,HDL:2,LDLCALC:2,TRIG:2,CHOLHDL:2,LDLDIRECT:2 in the last 72 hours No results found for this basename: TSH,T4TOTAL,FREET3,T3FREE,THYROIDAB in the last 72 hours No results found for this basename:  VITAMINB12:2,FOLATE:2,FERRITIN:2,TIBC:2,IRON:2,RETICCTPCT:2 in the last 72 hours  Micro Results: No results found for this or any previous visit (from the past 240 hour(s)).  Studies/Results: Ct Head Wo Contrast  01/24/2012  *RADIOLOGY REPORT*  Clinical Data: Weakness; hypertension.  CT HEAD WITHOUT CONTRAST  Technique:  Contiguous axial images were obtained from the base of the skull through the vertex without contrast.  Comparison: CT of the head performed 04/19/2011  Findings: There is no evidence of acute infarction, mass lesion, or intra- or extra-axial hemorrhage on CT.  Prominence of the ventricles and sulci reflects mild cortical volume loss.  Mild cerebellar atrophy is noted.  Scattered periventricular and subcortical white matter change likely reflects small vessel ischemic microangiopathy.  Chronic lacunar infarcts are suggested within the pons and left cerebellar hemisphere.  The fourth ventricle is within normal limits.  The basal ganglia are unremarkable in appearance.  The cerebral hemispheres demonstrate grossly normal gray-white differentiation.  No mass effect or midline shift is seen.  There is no evidence of fracture; visualized osseous structures are unremarkable in appearance.  The orbits are within normal limits. The paranasal sinuses and mastoid air cells are well-aerated.  No significant soft tissue abnormalities are seen.  IMPRESSION:  1.  No acute intracranial pathology seen on CT. 2.  Mild cortical  volume loss and scattered small vessel ischemic microangiopathy.  Likely chronic lacunar infarcts within the pons and left cerebellar hemisphere.  Original Report Authenticated By: Tonia Ghent, M.D.   Dg Chest Port 1 View  01/24/2012  *RADIOLOGY REPORT*  Clinical Data: Diffuse rales.  Weakness.  History of CHF.  PORTABLE CHEST - 1 VIEW  Comparison: 04/27/2011.  Findings: Stable enlarged cardiac silhouette.  Clear lungs.  Stable minimal diffuse peribronchial thickening.  Diffuse  osteopenia. Lower thoracic spine degenerative changes.  IMPRESSION: Stable cardiomegaly and minimal chronic bronchitic changes.  No acute abnormality.  Original Report Authenticated By: Darrol Angel, M.D.    Medications: I have reviewed the patient's current medications. Scheduled Meds:   . carvedilol  3.125 mg Oral Once  . carvedilol  6.25 mg Oral BID WC  . docusate sodium  100 mg Oral BID  . furosemide  60 mg Intravenous Once  . gabapentin  600 mg Oral QHS  . glipiZIDE  2.5 mg Oral Q breakfast  . heparin  5,000 Units Subcutaneous Q8H  . HYDROcodone-acetaminophen  1 tablet Oral Once  . senna  1 tablet Oral BID  . simvastatin  20 mg Oral q1800  . sodium chloride  3 mL Intravenous Q12H  . DISCONTD: carvedilol  3.125 mg Oral BID WC  . DISCONTD: HYDROcodone-acetaminophen  1 tablet Oral Once   Continuous Infusions:  PRN Meds:.acetaminophen, acetaminophen, HYDROcodone-acetaminophen, metoprolol, ondansetron (ZOFRAN) IV, ondansetron  Assessment/Plan: Atrial fibrillation with RVR- increased coreg, HR better controlled, no coumadin seconadary to fall risk, start ASA daily   Generalized weakness- PT for evaluation   DM- BS 165    Diastolic dysfunction- await recent echo  CKD- Cr stable, continue to monitor    LOS: 1 day  Bobbijo Holst, DO 01/25/2012, 9:20 AM

## 2012-01-25 NOTE — Progress Notes (Signed)
Thank you to Letha Cape RN CM for this referral.  Patient assessment complete.  Due to her advanced age, stability, and extensive caregiver support she would most likely have comparable benefits from the acute skilled services provided by home health.  Service information has been provided to the family in the event that a need for chronic disease management arises in the future.  For any additional questions or new referrals please contact Anibal Henderson BSN RN [facility] Liaison at [phone].

## 2012-01-25 NOTE — Evaluation (Signed)
[name],PT Acute Rehabilitation [phone] (pager)  

## 2012-01-25 NOTE — ED Notes (Signed)
Admitting MD at bedside.

## 2012-01-26 LAB — URINE CULTURE: Colony Count: >=100000

## 2012-01-26 LAB — GLUCOSE, CAPILLARY
Glucose-Capillary: 148 mg/dL — ABNORMAL HIGH (ref 70–99)
Glucose-Capillary: 108 mg/dL — ABNORMAL HIGH (ref 70–99)

## 2012-01-26 MED ORDER — DEXTROSE 5 % IV SOLN
1.0000 g | INTRAVENOUS | Status: DC
  Administered 2012-01-26: 1 g via INTRAVENOUS
  Filled 2012-01-26: qty 10

## 2012-01-26 MED ORDER — LEVOFLOXACIN 250 MG PO TABS
250.0000 mg | ORAL_TABLET | Freq: Every day | ORAL | Status: DC
  Administered 2012-01-26 – 2012-01-30 (×5): 250 mg via ORAL
  Filled 2012-01-26 (×5): qty 1

## 2012-01-26 MED ORDER — CARVEDILOL 12.5 MG PO TABS
12.5000 mg | ORAL_TABLET | Freq: Two times a day (BID) | ORAL | Status: DC
  Administered 2012-01-26 – 2012-01-29 (×6): 12.5 mg via ORAL
  Filled 2012-01-26 (×8): qty 1

## 2012-01-26 MED ORDER — GLIPIZIDE ER 5 MG PO TB24
5.0000 mg | ORAL_TABLET | Freq: Every day | ORAL | Status: DC
  Administered 2012-01-27 – 2012-01-30 (×4): 5 mg via ORAL
  Filled 2012-01-26 (×5): qty 1

## 2012-01-26 NOTE — Progress Notes (Signed)
   CARE MANAGEMENT NOTE 01/26/2012  Patient:  [name], [name]   Account Number:  [id number]  Date Initiated:  01/25/2012  Documentation initiated by:  Letha Cape  Subjective/Objective Assessment:   dx weakness, afib with rvr  admit- lives with daughter Gigi Gin  [phone].     Action/Plan:   continue with progression of care   Anticipated DC Date:  01/27/2012   Anticipated DC Plan:  HOME W HOME HEALTH SERVICES      DC Planning Services  CM consult      [facility] Choice  HOME HEALTH   Choice offered to / List presented to:  C-4 Adult Children        HH arranged  HH-2 PT  HH-4 NURSE'S AIDE  HH-1 RN      [facility] agency  [facility]   Status of service:  In process, will continue to follow Medicare Important Message given?   (If response is "NO", the following Medicare IM given date fields will be blank) Date Medicare IM given:   Date Additional Medicare IM given:    Discharge Disposition:    Per UR Regulation:    Comments:  PCP Alroy Dust  01/25/12 12:10 Letha Cape RN,BSN [phone] Patient lives with daughter, who is there 24 hrs/day. Patient has medication coverage and transportation. Physical therapy recs hhpt, daughter chose [facility] since they have worked with them before.  Referral made to [place] and Debbie notified.  Soc will begin 24-48 hrs post discharge.  Added HHRN for CHF.

## 2012-01-26 NOTE — Progress Notes (Signed)
Subjective: Patient admitted last night with a fib with RVR and weakness.  Worked with PT yesterday.  Daughter thinks patient is more confused than yesterday. Patient has no complaints    Objective: Vital signs in last 24 hours: Filed Vitals:   01/26/12 0100 01/26/12 0639 01/26/12 0642 01/26/12 1101  BP: 171/82 140/80 152/90   Pulse: 94 112 110 85  Temp: 99 F (37.2 C) 97.4 F (36.3 C)    TempSrc: Oral     Resp: 18 22    Weight:  74.208 kg (163 lb 9.6 oz)    SpO2: 94% 95%  97%   Weight change: -1.179 kg (-2 lb 9.6 oz)  Intake/Output Summary (Last 24 hours) at 01/26/12 1117 Last data filed at 01/26/12 0929  Gross per 24 hour  Intake    960 ml  Output    901 ml  Net     59 ml    Physical Exam: General: Awake, Oriented to person/place, No acute distress. HEENT: EOMI. Neck: Supple CV: irregular Lungs: Clear to ascultation bilaterally, no wheezing Abdomen: Soft, Nontender, Nondistended, +bowel sounds. Ext: Good pulses. Trace edema.   Lab Results:  [facility] 01/25/12 0520 01/24/12 2158  NA 137 136  K 4.2 4.0  CL 95* 96  CO2 30 28  GLUCOSE 158* 151*  BUN 18 15  CREATININE 1.26* 1.19*  CALCIUM 9.1 9.8  MG -- --  PHOS -- --    Basename 01/24/12 2158  AST 35  ALT 18  ALKPHOS 105  BILITOT 0.5  PROT 7.7  ALBUMIN 3.4*   No results found for this basename: LIPASE:2,AMYLASE:2 in the last 72 hours  Basename 01/25/12 0520 01/24/12 2158  WBC 6.4 8.1  NEUTROABS -- 5.0  HGB 14.6 15.8*  HCT 43.1 45.5  MCV 90.5 90.8  PLT 144* 139*    Basename 01/25/12 0520  CKTOTAL 72  CKMB 2.9  CKMBINDEX --  TROPONINI <0.30        Basename 01/25/12 0520  TSH 1.417  T4TOTAL --  T3FREE --  THYROIDAB --     Micro Results: Recent Results (from the past 240 hour(s))  URINE CULTURE     Status: Normal (Preliminary result)   Collection Time   01/24/12  9:53 PM      Component Value Range Status Comment   Specimen Description URINE, RANDOM   Final    Special Requests  NONE   Final    Culture  Setup Time [id number]   Final    Colony Count >=100,000 COLONIES/ML   Final    Culture ENTEROCOCCUS SPECIES   Final    Report Status PENDING   Incomplete     Studies/Results: Ct Head Wo Contrast  01/24/2012  *RADIOLOGY REPORT*  Clinical Data: Weakness; hypertension.  CT HEAD WITHOUT CONTRAST  Technique:  Contiguous axial images were obtained from the base of the skull through the vertex without contrast.  Comparison: CT of the head performed 04/19/2011  Findings: There is no evidence of acute infarction, mass lesion, or intra- or extra-axial hemorrhage on CT.  Prominence of the ventricles and sulci reflects mild cortical volume loss.  Mild cerebellar atrophy is noted.  Scattered periventricular and subcortical white matter change likely reflects small vessel ischemic microangiopathy.  Chronic lacunar infarcts are suggested within the pons and left cerebellar hemisphere.  The fourth ventricle is within normal limits.  The basal ganglia are unremarkable in appearance.  The cerebral hemispheres demonstrate grossly normal gray-white differentiation.  No mass effect or midline shift  is seen.  There is no evidence of fracture; visualized osseous structures are unremarkable in appearance.  The orbits are within normal limits. The paranasal sinuses and mastoid air cells are well-aerated.  No significant soft tissue abnormalities are seen.  IMPRESSION:  1.  No acute intracranial pathology seen on CT. 2.  Mild cortical volume loss and scattered small vessel ischemic microangiopathy.  Likely chronic lacunar infarcts within the pons and left cerebellar hemisphere.  Original Report Authenticated By: Tonia Ghent, M.D.   Dg Chest Port 1 View  01/24/2012  *RADIOLOGY REPORT*  Clinical Data: Diffuse rales.  Weakness.  History of CHF.  PORTABLE CHEST - 1 VIEW  Comparison: 04/27/2011.  Findings: Stable enlarged cardiac silhouette.  Clear lungs.  Stable minimal diffuse peribronchial thickening.   Diffuse osteopenia. Lower thoracic spine degenerative changes.  IMPRESSION: Stable cardiomegaly and minimal chronic bronchitic changes.  No acute abnormality.  Original Report Authenticated By: Darrol Angel, M.D.    Medications: I have reviewed the patient's current medications. Scheduled Meds:    . aspirin  325 mg Oral Daily  . carvedilol  6.25 mg Oral BID WC  . cefTRIAXone (ROCEPHIN)  IV  1 g Intravenous Q24H  . docusate sodium  100 mg Oral BID  . gabapentin  600 mg Oral QHS  . glipiZIDE  5 mg Oral Q breakfast  . heparin  5,000 Units Subcutaneous Q8H  . senna  1 tablet Oral BID  . simvastatin  20 mg Oral q1800  . sodium chloride  3 mL Intravenous Q12H  . DISCONTD: glipiZIDE  2.5 mg Oral Q breakfast   Continuous Infusions:  PRN Meds:.acetaminophen, acetaminophen, HYDROcodone-acetaminophen, metoprolol, ondansetron (ZOFRAN) IV, ondansetron  Assessment/Plan: Atrial fibrillation with RVR- increased coreg, HR better controlled, no coumadin seconadary to fall risk, start ASA daily   Generalized weakness- PT for evaluation- ? Snf: family unable to take home if patient not ambulatory   DM- BS 165 increase medicication    Diastolic dysfunction- echo ok  CKD- Cr stable, continue to monitor  UTI (prehospitalization)- enterococcus- start rocephin day #1  SNF vs home- hope to know better by tomorrow.    LOS: 2 days  Anuja Manka, DO 01/26/2012, 11:17 AM

## 2012-01-27 LAB — GLUCOSE, CAPILLARY
Glucose-Capillary: 119 mg/dL — ABNORMAL HIGH (ref 70–99)
Glucose-Capillary: 195 mg/dL — ABNORMAL HIGH (ref 70–99)
Glucose-Capillary: 89 mg/dL (ref 70–99)
Glucose-Capillary: 98 mg/dL (ref 70–99)

## 2012-01-27 NOTE — Plan of Care (Signed)
Problem: Phase II Progression Outcomes Goal: Walk in hall or up in chair TID Outcome: Completed/Met Date Met:  01/27/12 Up to [facility] and chair at least 5 times per day shift  Goal: Case manager referral Outcome: Completed/Met Date Met:  01/27/12 Social work consult

## 2012-01-27 NOTE — Progress Notes (Signed)
   CARE MANAGEMENT NOTE 01/27/2012  Patient:  DEONDREA, AGUADO   Account Number:  [id number]  Date Initiated:  01/25/2012  Documentation initiated by:  Letha Cape  Subjective/Objective Assessment:   dx weakness, afib with rvr  admit- lives with daughter Gigi Gin  [phone].     Action/Plan:   continue with progression of care   Anticipated DC Date:  01/27/2012   Anticipated DC Plan:  HOME W HOME HEALTH SERVICES  In-house referral  Clinical Social Worker      DC Planning Services  CM consult      [facility] Choice  HOME HEALTH   Choice offered to / List presented to:  C-4 Adult Children        HH arranged  HH-2 PT  HH-4 NURSE'S AIDE  HH-1 RN      [facility] agency  [facility]   Status of service:  In process, will continue to follow Medicare Important Message given?   (If response is "NO", the following Medicare IM given date fields will be blank) Date Medicare IM given:   Date Additional Medicare IM given:    Discharge Disposition:    Per UR Regulation:    Comments:  PCP Alroy Dust  01/27/12- 1515- Donn Pierini RN, BSN [phone] Daughter concerned about taking pt home, PT note from today supports need for ST-SNF. CSW consulted and following for placement needs. Discharge plan now is for ST-SNF when bed found.  01/25/12 12:10 Letha Cape RN,BSN [phone] Patient lives with daughter, who is there 24 hrs/day. Patient has medication coverage and transportation. Physical therapy recs hhpt, daughter chose [facility] since they have worked with them before.  Referral made to [place] and Debbie notified.  Soc will begin 24-48 hrs post discharge.  Added HHRN for CHF.

## 2012-01-27 NOTE — Plan of Care (Signed)
Problem: Consults Goal: Heart Failure Patient Education (See Patient Education module for education specifics.)  Outcome: Not Progressing Pt is 78 yrs old extremely hard of hearing. Daughter is unable to manage pt @ home right now. Pt will D/C to SNF when ready. Is not responsible for her own meds or food prep. Marisa Cyphers RN

## 2012-01-27 NOTE — Progress Notes (Signed)
Subjective: Patient eating breakfast, no new c/o.  No family at bedside. Await PT to see patient again  Objective: Vital signs in last 24 hours: Filed Vitals:   01/27/12 0346 01/27/12 0350 01/27/12 0353 01/27/12 0354  BP: 166/93 165/92 161/90 156/85  Pulse: 89 98 94 100  Temp: 98.1 F (36.7 C)     TempSrc: Oral     Resp: 18     Weight: 74.707 kg (164 lb 11.2 oz)     SpO2: 97%      Weight change: 0.499 kg (1 lb 1.6 oz)  Intake/Output Summary (Last 24 hours) at 01/27/12 0930 Last data filed at 01/27/12 0737  Gross per 24 hour  Intake    723 ml  Output   1301 ml  Net   -578 ml    Physical Exam: General: Awake, Oriented, No acute distress. HEENT: EOMI. Neck: Supple CV: irregular Lungs: Clear to ascultation bilaterally Abdomen: Soft, Nontender, Nondistended, +bowel sounds. Ext: Good pulses. Trace edema.   Lab Results:  [facility] 01/25/12 0520 01/24/12 2158  NA 137 136  K 4.2 4.0  CL 95* 96  CO2 30 28  GLUCOSE 158* 151*  BUN 18 15  CREATININE 1.26* 1.19*  CALCIUM 9.1 9.8  MG -- --  PHOS -- --    Basename 01/24/12 2158  AST 35  ALT 18  ALKPHOS 105  BILITOT 0.5  PROT 7.7  ALBUMIN 3.4*   No results found for this basename: LIPASE:2,AMYLASE:2 in the last 72 hours  Basename 01/25/12 0520 01/24/12 2158  WBC 6.4 8.1  NEUTROABS -- 5.0  HGB 14.6 15.8*  HCT 43.1 45.5  MCV 90.5 90.8  PLT 144* 139*    Basename 01/25/12 0520  CKTOTAL 72  CKMB 2.9  CKMBINDEX --  TROPONINI <0.30   No components found with this basename: POCBNP:3 No results found for this basename: DDIMER:2 in the last 72 hours No results found for this basename: HGBA1C:2 in the last 72 hours No results found for this basename: CHOL:2,HDL:2,LDLCALC:2,TRIG:2,CHOLHDL:2,LDLDIRECT:2 in the last 72 hours  Basename 01/25/12 0520  TSH 1.417  T4TOTAL --  T3FREE --  THYROIDAB --   No results found for this basename: VITAMINB12:2,FOLATE:2,FERRITIN:2,TIBC:2,IRON:2,RETICCTPCT:2 in the last 72  hours  Micro Results: Recent Results (from the past 240 hour(s))  URINE CULTURE     Status: Normal   Collection Time   01/24/12  9:53 PM      Component Value Range Status Comment   Specimen Description URINE, RANDOM   Final    Special Requests NONE   Final    Culture  Setup Time [id number]   Final    Colony Count >=100,000 COLONIES/ML   Final    Culture ENTEROCOCCUS SPECIES   Final    Report Status 01/26/2012 FINAL   Final    Organism ID, Bacteria ENTEROCOCCUS SPECIES   Final     Studies/Results: No results found.  Medications: I have reviewed the patient's current medications. Scheduled Meds:    . aspirin  325 mg Oral Daily  . carvedilol  12.5 mg Oral BID WC  . docusate sodium  100 mg Oral BID  . gabapentin  600 mg Oral QHS  . glipiZIDE  5 mg Oral Q breakfast  . heparin  5,000 Units Subcutaneous Q8H  . levofloxacin  250 mg Oral Daily  . senna  1 tablet Oral BID  . simvastatin  20 mg Oral q1800  . sodium chloride  3 mL Intravenous Q12H  . DISCONTD: carvedilol  6.25 mg Oral BID WC  . DISCONTD: cefTRIAXone (ROCEPHIN)  IV  1 g Intravenous Q24H  . DISCONTD: glipiZIDE  2.5 mg Oral Q breakfast   Continuous Infusions:  PRN Meds:.acetaminophen, acetaminophen, HYDROcodone-acetaminophen, ondansetron (ZOFRAN) IV, ondansetron, DISCONTD: metoprolol  Assessment/Plan:  Atrial fibrillation with RVR- increased coreg, HR better controlled, no coumadin seconadary to fall risk, start ASA daily  Enterococcus UTI- levaquin started yesterday day #2/5   Generalized weakness- daughter states she can not take patient home if she can not ambulate as per admission, await PT for help with ambulation and recommendations   DM-SSI BS:108-119    Diastolic dysfunction- echo ok  CKD- Cr stable, continue to monitor    LOS: 3 days  Fuquan Wilson, DO 01/27/2012, 9:30 AM

## 2012-01-27 NOTE — Progress Notes (Signed)
Physical Therapy Treatment Patient Details Name: SARIE STALL MRN: [id number] DOB: [birth year] Today's Date: 01/27/2012  PT Assessment/Plan  PT - Assessment/Plan Comments on Treatment Session: Patient is s/p admit for generalized weakness.  Patient has not progressed as anticipated.  Daughter is now concerned about taking patient home and is asking if patient could get some therapy prior to d/c home.  Informed patient and daughter that PT feels that patietn would benefit from NHP with therapy prior to d/c.  Notified the nurse and left a note for the physician.  Continue PT.   PT Plan: Discharge plan needs to be updated;Frequency remains appropriate PT Frequency: Min 3X/week Recommendations for Other Services: OT consult Follow Up Recommendations: Skilled nursing facility;Supervision/Assistance - 24 hour Equipment Recommended: Defer to next venue PT Goals  Acute Rehab PT Goals Time For Goal Achievement: 2 weeks PT Goal: Supine/Side to Sit - Progress: Progressing toward goal PT Goal: Sit to Stand - Progress: Progressing toward goal PT Goal: Stand to Sit - Progress: Progressing toward goal PT Goal: Ambulate - Progress: Progressing toward goal PT Goal: Perform Home Exercise Program - Progress: Progressing toward goal  PT Treatment Precautions/Restrictions  Precautions Precautions: Fall Required Braces or Orthoses: No Restrictions Weight Bearing Restrictions: No Mobility (including Balance) Bed Mobility Supine to Sit: HOB flat;5: Supervision Supine to Sit Details (indicate cue type and reason): Needed cues for transitioning to EOB and incr time. Sitting - Scoot to Edge of Bed: 5: Supervision Sitting - Scoot to [place] of Bed Details (indicate cue type and reason): cues for weight shifting Sit to Supine: Not Tested (comment) Transfers Sit to Stand: 1: +2 Total assist;Patient percentage (comment);From elevated surface;With upper extremity assist;From bed (pt = 60%) Sit to Stand Details  (indicate cue type and reason): Needed cueing on hand placement and assist to attain balance as knees buckled upon initial stand.  Stand to Sit: 3: Mod assist;With upper extremity assist;With armrests;To chair/3-in-1 Stand to Sit Details: Pt. with uncontrolled descent into chair Ambulation/Gait Ambulation/Gait Assistance: 3: Mod assist Ambulation/Gait Assistance Details (indicate cue type and reason): pt. needed cuing staying close to RW and to steady her as she ambulated.  Cues for posture needed as well.  Fatigued quickly. Ambulation Distance (Feet): 30 Feet Assistive device: Rolling walker Gait Pattern: Decreased stride length;Decreased step length - right;Decreased step length - left;Trunk flexed Stairs: No Wheelchair Mobility Wheelchair Mobility: No  Posture/Postural Control Posture/Postural Control: Postural limitations Postural Limitations: Flexed posture -kyphotic thoracic area Balance Balance Assessed: Yes Dynamic Sitting Balance Dynamic Sitting - Balance Support: Bilateral upper extremity supported;Feet supported Dynamic Sitting - Level of Assistance: 4: Min Oncologist Standing - Balance Support: Bilateral upper extremity supported;During functional activity Static Standing - Level of Assistance: 4: Min assist Exercise  General Exercises - Lower Extremity Ankle Circles/Pumps: AROM;Both;10 reps;Seated Quad Sets: AROM;Both;10 reps;Seated Gluteal Sets: AROM;Both;10 reps;Seated End of Session PT - End of Session Equipment Utilized During Treatment: Gait belt Activity Tolerance: Patient limited by fatigue Patient left: in chair;with call bell in reach;with family/visitor present Nurse Communication: Mobility status for transfers;Mobility status for ambulation General Behavior During Session: [facility] for tasks performed Cognition: [facility] for tasks performed  INGOLD,Mandee Pluta 01/27/2012, 1:24 PM [facility] [phone]  (pager)

## 2012-01-28 LAB — GLUCOSE, CAPILLARY: Glucose-Capillary: 195 mg/dL — ABNORMAL HIGH (ref 70–99)

## 2012-01-28 NOTE — Progress Notes (Addendum)
Subjective: Patient sleeping, woke up- slightly confused, Asking for daughter    Objective: Vital signs in last 24 hours: Filed Vitals:   01/27/12 0354 01/27/12 1437 01/27/12 2215 01/28/12 0550  BP: 156/85 148/78 151/88 153/80  Pulse: 100 87 96 93  Temp:  98.6 F (37 C) 98.5 F (36.9 C) 98.3 F (36.8 C)  TempSrc:  Oral    Resp:  18 16 18   Weight:    74.3 kg (163 lb 12.8 oz)  SpO2:  95% 97% 96%   Weight change: -0.407 kg (-14.4 oz)  Intake/Output Summary (Last 24 hours) at 01/28/12 0847 Last data filed at 01/28/12 2130  Gross per 24 hour  Intake    600 ml  Output   2194 ml  Net  -1594 ml    Physical Exam: General: Awake, Oriented, No acute distress. HEENT: EOMI. Neck: Supple CV: irregular Lungs: Clear to ascultation bilaterally Abdomen: Soft, Nontender, Nondistended, +bowel sounds. Ext: Good pulses. Trace edema.   Lab Results: No results found for this basename: NA:2,K:2,CL:2,CO2:2,GLUCOSE:2,BUN:2,CREATININE:2,CALCIUM:2,MG:2,PHOS:2 in the last 72 hours No results found for this basename: AST:2,ALT:2,ALKPHOS:2,BILITOT:2,PROT:2,ALBUMIN:2 in the last 72 hours No results found for this basename: LIPASE:2,AMYLASE:2 in the last 72 hours No results found for this basename: WBC:2,NEUTROABS:2,HGB:2,HCT:2,MCV:2,PLT:2 in the last 72 hours No results found for this basename: CKTOTAL:3,CKMB:3,CKMBINDEX:3,TROPONINI:3 in the last 72 hours No components found with this basename: POCBNP:3 No results found for this basename: DDIMER:2 in the last 72 hours No results found for this basename: HGBA1C:2 in the last 72 hours No results found for this basename: CHOL:2,HDL:2,LDLCALC:2,TRIG:2,CHOLHDL:2,LDLDIRECT:2 in the last 72 hours No results found for this basename: TSH,T4TOTAL,FREET3,T3FREE,THYROIDAB in the last 72 hours No results found for this basename: VITAMINB12:2,FOLATE:2,FERRITIN:2,TIBC:2,IRON:2,RETICCTPCT:2 in the last 72 hours  Micro Results: Recent Results (from the past 240  hour(s))  URINE CULTURE     Status: Normal   Collection Time   01/24/12  9:53 PM      Component Value Range Status Comment   Specimen Description URINE, RANDOM   Final    Special Requests NONE   Final    Culture  Setup Time [id number]   Final    Colony Count >=100,000 COLONIES/ML   Final    Culture ENTEROCOCCUS SPECIES   Final    Report Status 01/26/2012 FINAL   Final    Organism ID, Bacteria ENTEROCOCCUS SPECIES   Final     Studies/Results: No results found.  Medications: I have reviewed the patient's current medications. Scheduled Meds:    . aspirin  325 mg Oral Daily  . carvedilol  12.5 mg Oral BID WC  . docusate sodium  100 mg Oral BID  . gabapentin  600 mg Oral QHS  . glipiZIDE  5 mg Oral Q breakfast  . heparin  5,000 Units Subcutaneous Q8H  . levofloxacin  250 mg Oral Daily  . senna  1 tablet Oral BID  . simvastatin  20 mg Oral q1800  . sodium chloride  3 mL Intravenous Q12H   Continuous Infusions:  PRN Meds:.acetaminophen, acetaminophen, HYDROcodone-acetaminophen, ondansetron (ZOFRAN) IV, ondansetron, DISCONTD: metoprolol  Assessment/Plan:  Atrial fibrillation with RVR- increased coreg, HR better controlled, no coumadin seconadary to fall risk, start ASA daily  Enterococcus UTI- levaquin started yesterday day #3/5   Generalized weakness- daughter states she can not take patient home if she can not ambulate as per admission, plan to D/C to SNF   DM-SSI BS:128    Diastolic dysfunction- echo ok  CKD- Cr stable, continue to monitor  D/C to SNF when bed available     LOS: 4 days  Shondell Fabel, DO 01/28/2012, 8:47 AM

## 2012-01-29 LAB — GLUCOSE, CAPILLARY
Glucose-Capillary: 96 mg/dL (ref 70–99)
Glucose-Capillary: 210 mg/dL — ABNORMAL HIGH (ref 70–99)
Glucose-Capillary: 125 mg/dL — ABNORMAL HIGH (ref 70–99)
Glucose-Capillary: 142 mg/dL — ABNORMAL HIGH (ref 70–99)

## 2012-01-29 MED ORDER — HYDRALAZINE HCL 20 MG/ML IJ SOLN
10.0000 mg | Freq: Four times a day (QID) | INTRAMUSCULAR | Status: DC | PRN
  Filled 2012-01-29: qty 0.5

## 2012-01-29 MED ORDER — CARVEDILOL 25 MG PO TABS
25.0000 mg | ORAL_TABLET | Freq: Two times a day (BID) | ORAL | Status: DC
  Administered 2012-01-29 – 2012-01-30 (×2): 25 mg via ORAL
  Filled 2012-01-29 (×4): qty 1

## 2012-01-29 NOTE — Progress Notes (Signed)
.    FL2 completed for MD signature.  Pt faxed to [place]. Milus Banister MSW,LCSW w/e Coverage [phone]

## 2012-01-29 NOTE — Progress Notes (Signed)
Subjective: Sitting up in chair, no new c/o No fever, no chills, Wanting to go home   Objective: Vital signs in last 24 hours: Filed Vitals:   01/28/12 0550 01/28/12 1341 01/29/12 0504 01/29/12 0725  BP: 153/80 149/79 150/79 181/70  Pulse: 93 77 79 80  Temp: 98.3 F (36.8 C) 98 F (36.7 C) 97.9 F (36.6 C)   TempSrc:  Oral    Resp: 18 20 20    Weight: 74.3 kg (163 lb 12.8 oz)  74.118 kg (163 lb 6.4 oz)   SpO2: 96% 94% 95%    Weight change: -0.182 kg (-6.4 oz)  Intake/Output Summary (Last 24 hours) at 01/29/12 0904 Last data filed at 01/29/12 0505  Gross per 24 hour  Intake    423 ml  Output    901 ml  Net   -478 ml    Physical Exam: General: Awake, Oriented, No acute distress. HEENT: EOMI. Neck: Supple CV: irregular Lungs: Clear to ascultation bilaterally Abdomen: Soft, Nontender, Nondistended, +bowel sounds. Ext: Good pulses. Trace edema.   Lab Results: No results found for this basename: NA:2,K:2,CL:2,CO2:2,GLUCOSE:2,BUN:2,CREATININE:2,CALCIUM:2,MG:2,PHOS:2 in the last 72 hours No results found for this basename: AST:2,ALT:2,ALKPHOS:2,BILITOT:2,PROT:2,ALBUMIN:2 in the last 72 hours No results found for this basename: LIPASE:2,AMYLASE:2 in the last 72 hours No results found for this basename: WBC:2,NEUTROABS:2,HGB:2,HCT:2,MCV:2,PLT:2 in the last 72 hours No results found for this basename: CKTOTAL:3,CKMB:3,CKMBINDEX:3,TROPONINI:3 in the last 72 hours No components found with this basename: POCBNP:3 No results found for this basename: DDIMER:2 in the last 72 hours No results found for this basename: HGBA1C:2 in the last 72 hours No results found for this basename: CHOL:2,HDL:2,LDLCALC:2,TRIG:2,CHOLHDL:2,LDLDIRECT:2 in the last 72 hours No results found for this basename: TSH,T4TOTAL,FREET3,T3FREE,THYROIDAB in the last 72 hours No results found for this basename: VITAMINB12:2,FOLATE:2,FERRITIN:2,TIBC:2,IRON:2,RETICCTPCT:2 in the last 72 hours  Micro  Results: Recent Results (from the past 240 hour(s))  URINE CULTURE     Status: Normal   Collection Time   01/24/12  9:53 PM      Component Value Range Status Comment   Specimen Description URINE, RANDOM   Final    Special Requests NONE   Final    Culture  Setup Time [id number]   Final    Colony Count >=100,000 COLONIES/ML   Final    Culture ENTEROCOCCUS SPECIES   Final    Report Status 01/26/2012 FINAL   Final    Organism ID, Bacteria ENTEROCOCCUS SPECIES   Final     Studies/Results: No results found.  Medications: I have reviewed the patient's current medications. Scheduled Meds:    . aspirin  325 mg Oral Daily  . carvedilol  25 mg Oral BID WC  . docusate sodium  100 mg Oral BID  . gabapentin  600 mg Oral QHS  . glipiZIDE  5 mg Oral Q breakfast  . heparin  5,000 Units Subcutaneous Q8H  . levofloxacin  250 mg Oral Daily  . senna  1 tablet Oral BID  . simvastatin  20 mg Oral q1800  . sodium chloride  3 mL Intravenous Q12H  . DISCONTD: carvedilol  12.5 mg Oral BID WC   Continuous Infusions:  PRN Meds:.acetaminophen, acetaminophen, hydrALAZINE, HYDROcodone-acetaminophen, ondansetron (ZOFRAN) IV, ondansetron  Assessment/Plan:  Atrial fibrillation with RVR- increased coreg, HR better controlled, no coumadin seconadary to fall risk, start ASA daily  HTN- coreg  Enterococcus UTI- levaquin started yesterday day #4/5   Generalized weakness- daughter states she can not take patient home if she can not ambulate as per admission,  plan to D/C to SNF   DM-SSI BS:95    Diastolic dysfunction- echo ok  CKD- Cr stable, continue to monitor  D/C to SNF when bed available     LOS: 5 days  Jacqueline Lehr, DO 01/29/2012, 9:04 AM

## 2012-01-30 DIAGNOSIS — N390 Urinary tract infection, site not specified: Secondary | ICD-10-CM | POA: Diagnosis present

## 2012-01-30 LAB — BASIC METABOLIC PANEL
Chloride: 104 mEq/L (ref 96–112)
GFR calc Af Amer: 45 mL/min — ABNORMAL LOW (ref >90)
GFR calc non Af Amer: 38 mL/min — ABNORMAL LOW (ref >90)
Potassium: 4.2 mEq/L (ref 3.5–5.1)
Sodium: 141 mEq/L (ref 135–145)

## 2012-01-30 LAB — GLUCOSE, CAPILLARY: Glucose-Capillary: 98 mg/dL (ref 70–99)

## 2012-01-30 MED ORDER — GLIPIZIDE ER 5 MG PO TB24: 5.0000 mg | ORAL_TABLET | Freq: Every day | ORAL | Status: DC

## 2012-01-30 MED ORDER — HYDROCODONE-ACETAMINOPHEN 5-325 MG PO TABS: 1.0000 | ORAL_TABLET | Freq: Three times a day (TID) | ORAL | Status: AC | PRN

## 2012-01-30 MED ORDER — CARVEDILOL 25 MG PO TABS: 25.0000 mg | ORAL_TABLET | Freq: Two times a day (BID) | ORAL | Status: DC

## 2012-01-30 MED ORDER — ASPIRIN 325 MG PO TABS: 325.0000 mg | ORAL_TABLET | Freq: Every day | ORAL | Status: AC

## 2012-01-30 MED ORDER — DSS 100 MG PO CAPS: 100.0000 mg | ORAL_CAPSULE | Freq: Two times a day (BID) | ORAL | Status: AC

## 2012-01-30 NOTE — Progress Notes (Signed)
Subjective: Sitting in bed eating breakfast No new complaints of, wanting to go home   Objective: Vital signs in last 24 hours: Filed Vitals:   01/29/12 0946 01/29/12 1436 01/29/12 2127 01/30/12 0534  BP: 130/72 151/78 150/80 151/82  Pulse: 65 77 88 80  Temp:  97.5 F (36.4 C) 99.1 F (37.3 C) 98.8 F (37.1 C)  TempSrc:  Oral Oral Oral  Resp:  18 18 18   Weight:    74.435 kg (164 lb 1.6 oz)  SpO2:  95% 94% 92%   Weight change: 0.318 kg (11.2 oz)  Intake/Output Summary (Last 24 hours) at 01/30/12 0857 Last data filed at 01/30/12 1610  Gross per 24 hour  Intake    800 ml  Output   1300 ml  Net   -500 ml    Physical Exam: General: Awake, Oriented, No acute distress. HEENT: EOMI. Neck: Supple CV: irregular Lungs: Clear to ascultation bilaterally Abdomen: Soft, Nontender, Nondistended, +bowel sounds. Ext: Good pulses. Trace edema.   Lab Results:  [facility] 01/30/12 0658  NA 141  K 4.2  CL 104  CO2 29  GLUCOSE 104*  BUN 27*  CREATININE 1.14*  CALCIUM 9.5  MG --  PHOS --   No results found for this basename: AST:2,ALT:2,ALKPHOS:2,BILITOT:2,PROT:2,ALBUMIN:2 in the last 72 hours No results found for this basename: LIPASE:2,AMYLASE:2 in the last 72 hours No results found for this basename: WBC:2,NEUTROABS:2,HGB:2,HCT:2,MCV:2,PLT:2 in the last 72 hours No results found for this basename: CKTOTAL:3,CKMB:3,CKMBINDEX:3,TROPONINI:3 in the last 72 hours No components found with this basename: POCBNP:3 No results found for this basename: DDIMER:2 in the last 72 hours No results found for this basename: HGBA1C:2 in the last 72 hours No results found for this basename: CHOL:2,HDL:2,LDLCALC:2,TRIG:2,CHOLHDL:2,LDLDIRECT:2 in the last 72 hours No results found for this basename: TSH,T4TOTAL,FREET3,T3FREE,THYROIDAB in the last 72 hours No results found for this basename: VITAMINB12:2,FOLATE:2,FERRITIN:2,TIBC:2,IRON:2,RETICCTPCT:2 in the last 72 hours  Micro Results: Recent  Results (from the past 240 hour(s))  URINE CULTURE     Status: Normal   Collection Time   01/24/12  9:53 PM      Component Value Range Status Comment   Specimen Description URINE, RANDOM   Final    Special Requests NONE   Final    Culture  Setup Time [id number]   Final    Colony Count >=100,000 COLONIES/ML   Final    Culture ENTEROCOCCUS SPECIES   Final    Report Status 01/26/2012 FINAL   Final    Organism ID, Bacteria ENTEROCOCCUS SPECIES   Final     Studies/Results: No results found.  Medications: I have reviewed the patient's current medications. Scheduled Meds:    . aspirin  325 mg Oral Daily  . carvedilol  25 mg Oral BID WC  . docusate sodium  100 mg Oral BID  . gabapentin  600 mg Oral QHS  . glipiZIDE  5 mg Oral Q breakfast  . heparin  5,000 Units Subcutaneous Q8H  . levofloxacin  250 mg Oral Daily  . senna  1 tablet Oral BID  . simvastatin  20 mg Oral q1800  . sodium chloride  3 mL Intravenous Q12H   Continuous Infusions:  PRN Meds:.acetaminophen, acetaminophen, hydrALAZINE, HYDROcodone-acetaminophen, ondansetron (ZOFRAN) IV, ondansetron  Assessment/Plan:  Atrial fibrillation with RVR- increased coreg, HR better controlled, no coumadin seconadary to fall risk, start ASA daily  HTN- coreg  Enterococcus UTI- levaquin started day #5/5   Generalized weakness- daughter states she can not take patient home if she can  not ambulate as per admission, plan to D/C to SNF   DM-SSI BS:98    Diastolic dysfunction- echo ok  CKD- Cr stable, continue to monitor  D/C to SNF when bed available     LOS: 6 days  Jacqueline Whetzel, DO 01/30/2012, 8:57 AM

## 2012-01-30 NOTE — Progress Notes (Signed)
Physical Therapy Treatment Patient Details Name: [name] MRN: [id number] DOB: November 08, 1911 Today's Date: 01/30/2012  PT Assessment/Plan  PT - Assessment/Plan Comments on Treatment Session: Pt states that she is d/cing to [facility] today but still agreeable to participate in therapy.   PT Plan: Discharge plan remains appropriate PT Frequency: Min 3X/week Follow Up Recommendations: Skilled nursing facility Equipment Recommended: Defer to next venue PT Goals  Acute Rehab PT Goals PT Goal: Sit to Stand - Progress: Progressing toward goal PT Goal: Stand to Sit - Progress: Progressing toward goal PT Goal: Ambulate - Progress: Progressing toward goal  PT Treatment Precautions/Restrictions  Precautions Precautions: Fall Required Braces or Orthoses: No Restrictions Weight Bearing Restrictions: No Mobility (including Balance) Bed Mobility Bed Mobility: No Transfers Sit to Stand: 3: Mod assist;From chair/3-in-1;With armrests;With upper extremity assist Sit to Stand Details (indicate cue type and reason): (A) to achieve standing, anterior translation of trunk over BOS, balance, & safety.  Cues for initiation, hand placement & technique.  Performed 3x's. Required increased Mod (A) to stand from recliner.  Stand to Sit: 4: Min assist;To chair/3-in-1;With upper extremity assist;With armrests Stand to Sit Details: (A) to control descent.  Cues for hand placement & technique.   Ambulation/Gait Ambulation/Gait Assistance: 4: Min assist Ambulation/Gait Assistance Details (indicate cue type and reason): (A) for balance & safety.  Cues for safe use of RW, keep RW closer to body, increased upright posture.  Pt with kypothic posture & pushes RW to far out front of body.   Ambulation Distance (Feet): 45 Feet Assistive device: Rolling walker Gait Pattern: Decreased stride length;Decreased step length - right;Decreased step length - left;Trunk flexed Stairs: No Wheelchair Mobility Wheelchair Mobility:  No  Posture/Postural Control Posture/Postural Control: No significant limitations Exercise    End of Session PT - End of Session Equipment Utilized During Treatment: Gait belt Activity Tolerance: Patient tolerated treatment well Patient left: in chair;with call bell in reach;with family/visitor present General Behavior During Session: [facility] for tasks performed  Lara Mulch 01/30/2012, 1:05 PM [phone]

## 2012-01-30 NOTE — Progress Notes (Signed)
Pt to D/C today to [facility].  CSW spoke with Pt, Pt's family, and staff at facility.  All are in agreement with plan. Pt to be transported by [facility].  CSW will sign off at this time.

## 2012-01-30 NOTE — Discharge Summary (Signed)
Discharge Summary  [name] MR#: [id number]  DOB:[birth year]  Date of Admission: 01/24/2012 Date of Discharge: 01/30/2012  Patient's PCP: Michele Mcalpine, MD, MD  Attending Physician:Prosperity Darrough    Discharge Diagnoses:  *Generalized weakness  DM  Atrial fibrillation with RVR  Diastolic dysfunction UTI- treated   Brief Admitting History and Physical 89yoF with h/o AFib, DM/HTN, likely diastolic dysfunction, and other chronic age related  conditions presents with generalized weakness, found to be in AFib with RVR.  Pt was last admitted by [facility] in 04/2011 for complaints of feeling weak, productive cough,  increased DOE and orthopnea, BLE swelling, but with stable cardiomegaly on CXR. Echo at that time  showed LVH, normal EF. She was diuresed and discharged to SNF. There was also apparently PNA and  UTI treated with ABx.  Her daughter relates that her baseline is ambulatory with a walker for short distances, however  today she was unable to walk well so they put her in a wheelchair to go to the bathroom. However,  she was completely unable to get out of the wheelchair due to weakness. She was also "out of it"  and more lethargic. This seems to have been abruptly noted today, although her exercise tolerance  and ambulation has steadily declined over months to years. She also had and episode of incontince  while sitting in the wheelchair.  In the ED vitals significant for tachycardia to max 120, with preserved BP's. Labs with normal  chemistry panel, renal 15/1.19. Normal LFT's. BNP 1545 with negative Trop POC. Stable CBC. UA  without infection, UCx pending. CXR showed stable cardiomegaly with minimal chronic bronchitis  changes and nothing acute. CT head without anything acute, but with mild cortical volume loss,  scattered small vessel ischemic microangiopathy, likely chronic lacunar infarcts within the pons  and left cerebellar hemisphere. Pt has been given 60 mg IV Lasix and  Vicodin.    Discharge Medications Medication List  As of 01/30/2012 10:18 AM   STOP taking these medications         HYDROcodone-acetaminophen 5-500 MG per tablet      meclizine 25 MG tablet         TAKE these medications         acetaminophen 325 MG tablet   Commonly known as: TYLENOL   as needed - alternates this with hydrocodone      aspirin 325 MG tablet   Take 1 tablet (325 mg total) by mouth daily.      BILBERRY (VACCINIUM MYRTILLUS) PO   Take 320 mg by mouth daily.      carvedilol 25 MG tablet   Commonly known as: COREG   Take 1 tablet (25 mg total) by mouth 2 (two) times daily with a meal.      DSS 100 MG Caps   Take 100 mg by mouth 2 (two) times daily.      furosemide 20 MG tablet   Commonly known as: LASIX   Take 1 tablet (20 mg total) by mouth daily.      gabapentin 300 MG capsule   Commonly known as: NEURONTIN   Take 600 mg by mouth at bedtime.      glipiZIDE 5 MG 24 hr tablet   Commonly known as: GLUCOTROL XL   Take 1 tablet (5 mg total) by mouth daily with breakfast.      HYDROcodone-acetaminophen 5-325 MG per tablet   Commonly known as: NORCO   Take 1-2 tablets by mouth every 8 (  eight) hours as needed.      OCUVITE PO   Take 2 tablets by mouth daily.      omeprazole 20 MG capsule   Commonly known as: PRILOSEC   Take 20 mg by mouth daily. Take 1 capsule by mouth 30 minutes before first meal of the day      potassium chloride SA 20 MEQ tablet   Commonly known as: K-DUR,KLOR-CON   Take 1 tablet (20 mEq total) by mouth daily.      pravastatin 40 MG tablet   Commonly known as: PRAVACHOL   Take 1 tablet by mouth at bedtime.      Vitamin D 2000 UNITS Caps   Take 2,000 Units by mouth daily. 1 capsule by mouth once daily            Hospital Course: Generalized weakness- multifactorial (age, a fib, UTI) UTI-  Treated with levaquin- enterococcus .Atrial fibrillation with RVR- increased coreg, no coumadin secondary to fall risk, continue  ASA .Diastolic dysfunction- lasix and Kdur, EF 60% .DM - glucotrol, controlled nicely HTN- stable (coreg increased)   Day of Discharge BP 151/82  Pulse 80  Temp(Src) 98.8 F (37.1 C) (Oral)  Resp 18  Wt 74.435 kg (164 lb 1.6 oz)  SpO2 92%  Results for orders placed during the hospital encounter of 01/24/12 (from the past 48 hour(s))  GLUCOSE, CAPILLARY     Status: Abnormal   Collection Time   01/28/12 11:18 AM      Component Value Range Comment   Glucose-Capillary 195 (*) 70 - 99 (mg/dL)   GLUCOSE, CAPILLARY     Status: Abnormal   Collection Time   01/28/12  4:31 PM      Component Value Range Comment   Glucose-Capillary 138 (*) 70 - 99 (mg/dL)   GLUCOSE, CAPILLARY     Status: Normal   Collection Time   01/28/12  9:18 PM      Component Value Range Comment   Glucose-Capillary 96  70 - 99 (mg/dL)   GLUCOSE, CAPILLARY     Status: Normal   Collection Time   01/29/12  6:03 AM      Component Value Range Comment   Glucose-Capillary 95  70 - 99 (mg/dL)   GLUCOSE, CAPILLARY     Status: Abnormal   Collection Time   01/29/12 10:53 AM      Component Value Range Comment   Glucose-Capillary 210 (*) 70 - 99 (mg/dL)   GLUCOSE, CAPILLARY     Status: Abnormal   Collection Time   01/29/12  4:05 PM      Component Value Range Comment   Glucose-Capillary 125 (*) 70 - 99 (mg/dL)   GLUCOSE, CAPILLARY     Status: Abnormal   Collection Time   01/29/12  9:18 PM      Component Value Range Comment   Glucose-Capillary 142 (*) 70 - 99 (mg/dL)    Comment 1 Notify RN      Comment 2 Documented in Chart     BASIC METABOLIC PANEL     Status: Abnormal   Collection Time   01/30/12  6:58 AM      Component Value Range Comment   Sodium 141  135 - 145 (mEq/L)    Potassium 4.2  3.5 - 5.1 (mEq/L)    Chloride 104  96 - 112 (mEq/L)    CO2 29  19 - 32 (mEq/L)    Glucose, Bld 104 (*) 70 - 99 (mg/dL)  BUN 27 (*) 6 - 23 (mg/dL)    Creatinine, Ser 1.61 (*) 0.50 - 1.10 (mg/dL)    Calcium 9.5  8.4 - 10.5 (mg/dL)     GFR calc non Af Amer 38 (*) >90 (mL/min)    GFR calc Af Amer 45 (*) >90 (mL/min)   GLUCOSE, CAPILLARY     Status: Normal   Collection Time   01/30/12  7:03 AM      Component Value Range Comment   Glucose-Capillary 98  70 - 99 (mg/dL)     Ct Head Wo Contrast  01/24/2012  *RADIOLOGY REPORT*  Clinical Data: Weakness; hypertension.  CT HEAD WITHOUT CONTRAST  Technique:  Contiguous axial images were obtained from the base of the skull through the vertex without contrast.  Comparison: CT of the head performed 04/19/2011  Findings: There is no evidence of acute infarction, mass lesion, or intra- or extra-axial hemorrhage on CT.  Prominence of the ventricles and sulci reflects mild cortical volume loss.  Mild cerebellar atrophy is noted.  Scattered periventricular and subcortical white matter change likely reflects small vessel ischemic microangiopathy.  Chronic lacunar infarcts are suggested within the pons and left cerebellar hemisphere.  The fourth ventricle is within normal limits.  The basal ganglia are unremarkable in appearance.  The cerebral hemispheres demonstrate grossly normal gray-white differentiation.  No mass effect or midline shift is seen.  There is no evidence of fracture; visualized osseous structures are unremarkable in appearance.  The orbits are within normal limits. The paranasal sinuses and mastoid air cells are well-aerated.  No significant soft tissue abnormalities are seen.  IMPRESSION:  1.  No acute intracranial pathology seen on CT. 2.  Mild cortical volume loss and scattered small vessel ischemic microangiopathy.  Likely chronic lacunar infarcts within the pons and left cerebellar hemisphere.  Original Report Authenticated By: Tonia Ghent, M.D.   Dg Chest Port 1 View  01/24/2012  *RADIOLOGY REPORT*  Clinical Data: Diffuse rales.  Weakness.  History of CHF.  PORTABLE CHEST - 1 VIEW  Comparison: 04/27/2011.  Findings: Stable enlarged cardiac silhouette.  Clear lungs.  Stable  minimal diffuse peribronchial thickening.  Diffuse osteopenia. Lower thoracic spine degenerative changes.  IMPRESSION: Stable cardiomegaly and minimal chronic bronchitic changes.  No acute abnormality.  Original Report Authenticated By: Darrol Angel, M.D.     Disposition: SNF  Diet: cardiac/diabetic  Activity: as tolerated by PT   Follow-up Appts: Discharge Orders    Future Appointments: Provider: Department: Dept Phone: Center:   04/24/2012 10:30 AM Michele Mcalpine, MD [facility] [phone] None     Future Orders Please Complete By Expires   Diet - low sodium heart healthy      Diet Carb Modified      Increase activity slowly           Time spent on discharge, talking to the patient, and coordinating care: 40 mins.   SignedMarlin Canary, DO 01/30/2012, 10:18 AM

## 2012-01-30 NOTE — Progress Notes (Signed)
DC IV, DC Tele, DC to SNF. Discharge instructions and home medications discussed with patient ad family. Patient and family denies any questions or concerns at this time. Patient leaving unit via ambulance and appears in no acute distress.

## 2012-02-14 ENCOUNTER — Telehealth: Payer: Self-pay | Admitting: Pulmonary Disease

## 2012-02-14 NOTE — Telephone Encounter (Signed)
[name] returning call can be reached at [phone].[name]

## 2012-02-14 NOTE — Telephone Encounter (Signed)
LMTCB

## 2012-02-14 NOTE — Telephone Encounter (Signed)
LMOM for [name] with [facility] to inform her of SN's response/recs   Called and spoke with pt's daughter, [name], and informed her of this as well.  Wilma scheduled pt to see TP tomorrow at 10:15 am

## 2012-02-14 NOTE — Telephone Encounter (Signed)
Per SN get her an appt with TP this week, and we will sign orders for home health. Carron Curie, CMA

## 2012-02-14 NOTE — Telephone Encounter (Signed)
PT was in hospital on 01-25-12 for exacerbation of CHF and was discharged to [facility] nursing home on 01-30-12 through 02-10-12.  Pt is now at home and [facility] gave order for home PT, OT and SN care to [facility].  They now need someone to sign her orders for this since she is no longer being seen by Dr at [facility].  Pt has appt with Dr Kriste Basque 04-24-12 but needs sooner appt to f/u on some med discrepancies from [facility].  Please advise if you will follow this pt's home health orders .

## 2012-02-14 NOTE — Telephone Encounter (Signed)
I spoke with [facility] and she is aware of appt. Carron Curie, CMA

## 2012-02-14 NOTE — Telephone Encounter (Signed)
[name] returning call can now be reached at [phone].Raylene Everts

## 2012-02-14 NOTE — Telephone Encounter (Signed)
Terance Ice, pt's daughter, requests to speak w/ Leigh & can be reached at [phone].  Ms. Bea Laura

## 2012-02-15 ENCOUNTER — Other Ambulatory Visit (INDEPENDENT_AMBULATORY_CARE_PROVIDER_SITE_OTHER): Payer: Medicare Other

## 2012-02-15 ENCOUNTER — Ambulatory Visit (INDEPENDENT_AMBULATORY_CARE_PROVIDER_SITE_OTHER): Payer: Medicare Other | Admitting: Adult Health

## 2012-02-15 ENCOUNTER — Encounter: Payer: Self-pay | Admitting: Adult Health

## 2012-02-15 VITALS — BP 128/84 | HR 81 | Temp 97.1°F | Ht 62.0 in | Wt 172.0 lb

## 2012-02-15 DIAGNOSIS — Z87448 Personal history of other diseases of urinary system: Secondary | ICD-10-CM

## 2012-02-15 DIAGNOSIS — I10 Essential (primary) hypertension: Secondary | ICD-10-CM

## 2012-02-15 DIAGNOSIS — I4891 Unspecified atrial fibrillation: Secondary | ICD-10-CM

## 2012-02-15 DIAGNOSIS — R531 Weakness: Secondary | ICD-10-CM

## 2012-02-15 DIAGNOSIS — R5381 Other malaise: Secondary | ICD-10-CM

## 2012-02-15 LAB — BASIC METABOLIC PANEL
BUN: 17 mg/dL (ref 6–23)
Creatinine, Ser: 1.2 mg/dL (ref 0.4–1.2)
GFR: 42.75 mL/min — ABNORMAL LOW (ref >60.00)
Glucose, Bld: 172 mg/dL — ABNORMAL HIGH (ref 70–99)
Potassium: 5 mEq/L (ref 3.5–5.1)

## 2012-02-15 MED ORDER — CARVEDILOL 25 MG PO TABS: ORAL_TABLET | ORAL | Status: DC

## 2012-02-15 NOTE — Assessment & Plan Note (Signed)
Controlled.  

## 2012-02-15 NOTE — Assessment & Plan Note (Signed)
Controlled rate- will adjust coreg as recent admission with RVR  Will go to 12.5mg  Twice daily   Change to ASA 325mg  daily as per discharge

## 2012-02-15 NOTE — Progress Notes (Signed)
Addended by: Reynaldo Minium C on: 02/15/2012 11:06 AM   Modules accepted: Orders

## 2012-02-15 NOTE — Patient Instructions (Signed)
Change Coreg to 25mg  1/2 Twice daily   Change Aspirin to 325mg  daily  Continue on same dose of Potassium daily  Follow up Dr. Kriste Basque  In 2 months and As needed   Please contact office for sooner follow up if symptoms do not improve or worsen or seek emergency care

## 2012-02-15 NOTE — Progress Notes (Signed)
Subjective:    Patient ID: [name], female    DOB: 01/03/1911, 78 y.o.   MRN: [id number]  HPI 78 y/o WF   ~  August 18, 2010:  she fell 2wks ago- hit occiput w/ hematoma on scalp> ER eval reviewed: lost balance; fell into bathtub; no LOC;  labs- OK, BS=180, +UTI given macrobid;  CT Brain= atrophy, chr microvasc dis, NAD;  CT CSpine= multilevel spondylosis & suboccipital hematoma in soft tissues w/o fx etc...  She had Ophthal f/u 3/11 DrRankin> senile macular degen OU, vitreous detach OS, bilat Drusen...  she received letter from [facility] DM & needs to be on ACE or ARB, therefore start Losartan 25mg /d...  ~  October 11, 2010:  add-on appt today after fall at home yest> she has been sl dizzy in the AMs & fell in the bathroom yest AM striking right occiput on commode- no blackout etc, didn't break skin, no hematoma but area bruised, min tender, neck OK, etc... she notes severe arthritis w/ pain & left knee gives way (right TKR in past)... she had recent infected callous tip of left 3rd hammer toe w/ drainage & antibiotic rx from DrSikora- much improved now (prob neuropathy & they are planning to get DM shoes)... BP appears well controlled on meds & no postural changes apparent, but sl higher in AMs & we discussed poss strategies w/ timing of pills etc... Plan- check CT Brain w/o contrast>   ~  February 14, 2011:  4 mo ROV- 78 y/o now & no further falls...    BP controlled on Aten, Losar, & Lasix> 110.54 today, no postural change & feeling well w/o CP, palpit, ch in SOB,syncope, etc;  CXR f/u today w/ Cardiomeg (no change), clear/ NAD;  Labs look good & BNP=211 (continue same meds);  she has AFib w/ rate control strategy> doing well on meds +ASA...    Chol remains stable on Prav40 + diet;  not fasting today> continue same...    DM controlled on her Metformin Bid + diet> BS 136, A1c 6.8; continue same Rx...    Mild renal insuffic w/ BUN 27, creat 1.5 on meds +Lasix 40mg /d; continue same for now...  Other problems: GI stable; Vit D level= 50 & supplemented w/ 2000 u daily; no recent falls w/ her new DM shows from Podiatrist...  ~  July, 31, 2012:  37mo ROV & she was [place] 5/12 by [facility] w/ ?Pneumonia LLL (no sputum, no cultures), CHF/ DD, AFib not a coumadin cand, UTI (urine +Klebs & Enterococcus, treated w/ Avelox), & deconditioned; disch to [facility] for rehab, now back home w/ difficulty & help including home PT (not much benefit per pt, but daugh thinks it's helping);  She had prob w/ bladder, required foley, had f/u Urology & cath has been removed; currently she is more mobile, hard to rise from chair, walks some, showers, states she's ok w/ ADLs;  She is c/o a dermatitis on leg today- looks like psoriasis, try Clobetasol cream (if no better she'll need Derm refer)...  They request refills today- OK >>     We are having the usual difficult reconciling her meds from prev office, then hosp disch, then [facility] disch ==> now new home meds >> she is asked to bring all med bottles for review!!!  ~  October 25, 2011:  3-73mo ROV & essentially stable w/ mult minor complaints- eg.ears, heberden's nodes, urinary incont (doesn't want Urology or meds); Podiatry- DrSikora working on her toes;  she is able to do her own ADLs- toilets, bathes, walks some HBP> on Coreg3.125Bid, Losartan25, Lasix20, K20; BP= 124/80 & she denies HA, CP, palpit, ch in SOB or edema... AFib & CHF> on ASA + above meds; rate control strategy, no Coumadin due to falls... Chol> on Prav40 & due for FLP (she will ret for this)... DM> on Glipiz2.5 & home BS checks ~100 she says; needs to ret for A1c... GERD> on Prilosec20 & symptoms controlled... Urology> followed by DrTannenbaum, emptying better & bladder function improved... DJD> hx severe arthritis w/ right TKR & using vicodin 2-3/d as needed... Gait Abn & Falls> on Neurontin300-2Qhs, followed by Ortho & Podiatry...  ~02/15/2012 Follow up  Pt returns with daughter. Recent admission last  month for UTI, weakness, and Afib -RVR. Discharged to [facility] for rehab. She is now back home with famlily. Needs order for PT/OT and updated rx. During admission had AFib w/ RVR -her coreg was increased to 25mg  Twice daily  (prev on coreg 3.125 mg Twice daily  ) . She has went back to the previous dose because no rx was sent.  She is feeling better and can now walk with walker. No chest pain or increased edema.           Problem List:  MACULAR DEGENERATION (ICD-362.50) - on OCUVITES daily... s/p laser surg by DrRankin... she cannot see well enough to read... they tried Bilberry Juice as heard on the [facility] [employer]- it's helping... ~  3/10: had f/u w/ DrRankin- stabilized... nothing else he can do... f/u 74yr. ~  3/11:  had f/u DrRankin> senile macular degen OU (worsening), vitreous detach OS (stable), bilat Drusen...    PULMONARY NODULE (ICD-518.89) - RUL nodule, eval 2007 w/ bx = granuloma...  ~  Serial CXRs have been stable... ~  CXR 3/11 showed chr changes & calcif granulomas, prob calcif of LAD seen on lat. ~  CXR 5/12 w/ Hosp for LLL infiltrate, improved serially w/ antibiotic rx.  HYPERTENSION (ICD-401.9) - she takes ASA 81mg /d, prev Aten ch to COREG 3.125mg Bid, LOSARTAN 25mg /d, & prev LASIX40 ch to 20mg /d, K20 added 1/d... BP=116/64, no postural changes, tol meds well; denies HA, CP, palpit, change in dyspnea, edema, etc. ~  9/11:  she received letter from [facility] stating she should be on ACE or ARB Rx> add Losartan 25mg /d... ~  5/12:  Hosp by [facility] w/ LLL pneumonia, UTI, CHF/ DD & meds were changed/ adjusted> Losartan & Atenolol stopped, Coreg added, Lasix decreased KCl added. ~  7/12:  1st post hosp (5/12) visit & meds reviewed & reconciled ~  11/12:  on Coreg3.125Bid, Losartan25, Lasix20, K20; BP= 124/80 & she denies HA, CP, palpit, ch in SOB or edema...  CONGESTIVE HEART FAILURE (ICD-428.0) - on above meds... ~  2DEcho 6/06 showed biatrial enlargement, mild MR/TR, norm LVF... ~  labs  3/11 showed BNP= 189... ~  Labs from 5/12 hosp reviewed in [place]...  ATRIAL FIBRILLATION, CHRONIC (ICD-427.31) - prev on Coumadin, pt & family decided to stop Coumadin in 2007, & now on ASA daily... rate control strategy w/ Aten (now COREG)... 01/2012 >admission , coreg increased 25mg  Twice daily   02/15/2012 adjusted coreg to 25mg  1/2 Twice daily    VENOUS INSUFFICIENCY (ICD-459.81) - on low sodium diet, and Lasix... no edema...  HYPERCHOLESTEROLEMIA (ICD-272.0) - on PRAVACHOL 40mg Qhs and tol well... ~  FLP 11/08 showed TChol 151, TG 152, HDL 38, LDL 82... > continue med & diet efforts. ~  FLP 3/09 shows  TChol 206, TG 332, HDL 40, LDL 97... > cont same, get wt down! ~  FLP 3/10 showed TChol 172, TG 214, HDL 43, LDL 92... > improved- continue same. ~  FLP 3/11 showed TChol 167, TG 236, HDL 46, LDL 83 ~  FLP 9/11 showed TChol 144, TG 212, HDL 36, LDL 72... reminded of low fat diet rec... ~  11/12: pt needs to ret for FLP...  DM (ICD-250.00) - prev on Metform500bid, stopped 5/12 Hosp & ch to GLIPIZIDE XL 2.5mg  Qam... ~  labs 11/08 showed FBS=182, HgA1c=7.1... >continue attempts at diet control. ~  labs 3/09 showed  FBS=178, HgA1c=7.2... >she will need to start meds: Metformin ER 500mg Qam. ~  labs 7/09 showed BS= 138, HgA1c= 6.7.Marland KitchenMarland Kitchenrec- keep same.  ~  labs 3/10 (wt=162#) showed 139, HgA1c= 6.6... > continue same meds. ~  labs 9/10 (wt=175#) showed BS= 158, A1c= 6.8.Marland KitchenMarland Kitchen needs better diet. ~  labs 3/11 (wt=179#) showed BS= 155, A1c= 7.6.Marland KitchenMarland Kitchen may need incr meds! get wt down. ~  labs 9/11 (wt=173#) showed BS= 166, A1c= 8.0.Marland KitchenMarland Kitchen worsening A1c- rec incr Metform to 2/d... ~  Urine microalb 9/11 = neg... ~  Labs 5/12 Hosp reviewed in EChart> off Metform, on Glipizide 2.5mg /d... ~  Labs 7/12 in office on Glip2.5 showed BS= 101, A1c= 6.7.Marland KitchenMarland Kitchen rec to continue same. ~  11/12:  daugh states BS at home all ~100 on the Glip2.5 daily; needs to ret for labs & A1c.  GERD (ICD-530.81) - on PRILOSEC 20mg /d, doing well  without heartburn, n/v, etc... Hx HH, GERD, and prev DU... last EGD was 7/02 showing HH, reflux, stricture, gastric ulcer... she has refused colonoscopies in the past...  UTI'S, HX OF (ICD-V13.00) - hx UTI's, s/p hysterectomy, prev bladder surg... ~  8/11:  ER visit after fall w/ UTI Rx'd w/ Macrobid... ~  Urinary incont/ bladder prob followed by DrTannenbaum...  DEGENERATIVE JOINT DISEASE (ICD-715.90)  - severe arthritis w/ prev right TKR 1990... followed by DrGioffre, DrBednarz, DrSikora (hammer toes)- not willing to consider more surg... uses Vicodin 2-3 per day as needed, shots not helpful in the past... leg discomfort may be neuropathy (but monofilament test is normal)- on Neurontin Rx taking 300mg - 2Qhs... ~  she ambulates w/ walker, she has had phys therapy & does chair exercises, she has knee braces, etc...  VITAMIN D DEFICIENCY (ICD-268.9) - not currently taking any supplement. ~  labs 7/09 showed Vit D level = 11... rec> start Vit D 50000 u weekly (but she stopped after 25mo). ~  labs 3/10 showed Vit D level = 30... rec> change to 2000 u daily.  ABNORMALITY OF GAIT (ICD-781.2) & Frequent FALLS>  ~  9/11:  she fell 2wks ago- hit occiput w/ hematoma on scalp> ER eval reviewed: lost balance; fell into bathtub; no LOC;  labs- OK, BS=180, +UTI given macrobid;  CT Brain= atrophy, chr microvasc dis, NAD;  CT CSpine= multilevel spondylosis & suboccipital hematoma in soft tissues w/o fx etc...   ~  11/11:  she fell yest- hit right occiput on commode w/ bruise but no hematoma; didn't go to the ER; no focal neuro deficits & we discussed checking CT Brain to be sure no subdural etc... ~  11/12:  She continues weak, gait abn, but manages her own ADLs ok...  ANXIETY (ICD-300.00  DERMATITIS - uses generic LIDEX E cream as needed...  Health Maintenance:  she gets the yearly Flu vaccine each Aut[employer];  and she had P[facility]UMOVAX ~[facility] at [facility], she says.  Past Surgical History  Procedure Date  .  Cholecystectomy 6/07    Dr. Corliss Skains  . Total abdominal hysterectomy   . Bladder surgery   . Total knee arthroplasty 1990    right - Dr. Darrelyn Hillock    Outpatient Encounter Prescriptions as of 02/15/2012  Medication Sig Dispense Refill  . acetaminophen (TYLENOL) 325 MG tablet as needed - alternates this with hydrocodone       . Cholecalciferol (VITAMIN D) 2000 UNITS CAPS Take 2,000 Units by mouth daily. 1 capsule by mouth once daily      . furosemide (LASIX) 20 MG tablet Take 1 tablet (20 mg total) by mouth daily.  30 tablet  11  . gabapentin (NEURONTIN) 300 MG capsule Take 600 mg by mouth at bedtime.      Marland Kitchen glipiZIDE (GLUCOTROL XL) 5 MG 24 hr tablet Take 2.5 mg by mouth daily with breakfast.      . HYDROcodone-acetaminophen (VICODIN) 5-500 MG per tablet 1 tab every 4-6 hours as needed for pain      . Multiple Vitamins-Minerals (OCUVITE PO) Take 2 tablets by mouth daily.       Marland Kitchen omeprazole (PRILOSEC) 20 MG capsule Take 20 mg by mouth daily. Take 1 capsule by mouth 30 minutes before first meal of the day      . potassium chloride SA (K-DUR,KLOR-CON) 20 MEQ tablet Take 1 tablet (20 mEq total) by mouth daily.  30 tablet  11  . pravastatin (PRAVACHOL) 40 MG tablet Take 40 mg by mouth daily.      Marland Kitchen DISCONTD: aspirin 81 MG tablet Take 81 mg by mouth daily.      Marland Kitchen DISCONTD: carvedilol (COREG) 3.125 MG tablet Take 3.125 mg by mouth 2 (two) times daily with a meal.      . DISCONTD: glipiZIDE (GLUCOTROL XL) 5 MG 24 hr tablet Take 1 tablet (5 mg total) by mouth daily with breakfast.      . DISCONTD: pravastatin (PRAVACHOL) 40 MG tablet Take 1 tablet by mouth at bedtime.  30 tablet  11  . aspirin 325 MG tablet Take 1 tablet (325 mg total) by mouth daily.      Marland Kitchen BILBERRY, VACCINIUM MYRTILLUS, PO Take 320 mg by mouth daily.      . carvedilol (COREG) 25 MG tablet 1/2 tab Twice daily  60 tablet  3  . DISCONTD: carvedilol (COREG) 25 MG tablet Take 1 tablet (25 mg total) by mouth 2 (two) times daily with a meal.         Allergies  Allergen Reactions  . Celecoxib     GI discomfort  . Diclofenac Sodium     REACTION: causing burining sensation after use    Current Medications, Allergies, Past Medical History, Past Surgical History, Family History, and Social History were reviewed in [employer] record.    Review of Systems    Constitutional:   No  weight loss, night sweats,  Fevers, chills,  +fatigue, or  lassitude.  HEENT:   No headaches,  Difficulty swallowing,  Tooth/dental problems, or  Sore throat,                No sneezing, itching, ear ache, nasal congestion, post nasal drip,   CV:  No chest pain,  Orthopnea, PND,   anasarca, dizziness, palpitations, syncope.   GI  No heartburn, indigestion, abdominal pain, nausea, vomiting, diarrhea, change in bowel habits, loss of appetite, bloody stools.   Resp:   No coughing up of  blood.   No chest wall deformity  Skin: no rash or lesions.  GU: no dysuria, change in color of urine, no urgency or frequency.  No flank pain, no hematuria   MS:  No joint pain or swelling.  No decreased range of motion.  No back pain.  Psych:  No change in mood or affect. No depression or anxiety.  No memory loss.       Objective:   Physical Exam     WD, WN, 78 y/o WF chr ill appearing but in NAD...in wheelchair  GENERAL:  Alert & oriented; pleasant & cooperative.Marland Kitchen  HEENT:  [place]/AT, EOM-wnl,; EACs-small wax on right , TMs-wnl, NOSE-clear, THROAT-clear w/ dry MM's. NECK:  Supple w/ fairROM; no JVD; normal carotid impulses w/o bruits; no thyromegaly or nodules palpated; no lymphadenopathy. CHEST:  Clear to P & A; without wheezes/ rales/ or rhonchi. HEART:  irreg, gr 1/6 SEM without rubs or gallops detected... ABDOMEN:  Soft & nontender; normal bowel sounds; no organomegaly or masses detected. EXT:  severe arthritic changes & rightTKR; no varicose veins/ +venous insuffic/ tr edema. NEURO:  C  no focal neuro deficits  DERM:  No lesions  noted; no rash etc...     Assessment & Plan:

## 2012-02-15 NOTE — Assessment & Plan Note (Signed)
Recent UTI w/ admission  Now resolved.

## 2012-02-16 NOTE — Progress Notes (Signed)
Addended by: Boone Master E on: 02/16/2012 11:42 AM   Modules accepted: Orders

## 2012-04-17 ENCOUNTER — Other Ambulatory Visit: Payer: Self-pay | Admitting: Pulmonary Disease

## 2012-04-17 NOTE — Telephone Encounter (Signed)
Please advise if ok to refill. Thanks 

## 2012-04-24 ENCOUNTER — Telehealth: Payer: Self-pay | Admitting: Pulmonary Disease

## 2012-04-24 ENCOUNTER — Other Ambulatory Visit (INDEPENDENT_AMBULATORY_CARE_PROVIDER_SITE_OTHER): Payer: Medicare Other

## 2012-04-24 ENCOUNTER — Encounter: Payer: Self-pay | Admitting: Pulmonary Disease

## 2012-04-24 ENCOUNTER — Ambulatory Visit: Payer: Medicare Other | Admitting: Internal Medicine

## 2012-04-24 ENCOUNTER — Ambulatory Visit (INDEPENDENT_AMBULATORY_CARE_PROVIDER_SITE_OTHER): Payer: Medicare Other | Admitting: Pulmonary Disease

## 2012-04-24 VITALS — BP 140/98 | HR 64 | Temp 96.9°F | Ht 63.0 in | Wt 171.0 lb

## 2012-04-24 DIAGNOSIS — I10 Essential (primary) hypertension: Secondary | ICD-10-CM

## 2012-04-24 DIAGNOSIS — E119 Type 2 diabetes mellitus without complications: Secondary | ICD-10-CM

## 2012-04-24 DIAGNOSIS — F411 Generalized anxiety disorder: Secondary | ICD-10-CM

## 2012-04-24 DIAGNOSIS — E7800 Pure hypercholesterolemia, unspecified: Secondary | ICD-10-CM

## 2012-04-24 DIAGNOSIS — I509 Heart failure, unspecified: Secondary | ICD-10-CM

## 2012-04-24 DIAGNOSIS — R413 Other amnesia: Secondary | ICD-10-CM

## 2012-04-24 DIAGNOSIS — I4891 Unspecified atrial fibrillation: Secondary | ICD-10-CM

## 2012-04-24 DIAGNOSIS — R269 Unspecified abnormalities of gait and mobility: Secondary | ICD-10-CM

## 2012-04-24 DIAGNOSIS — M1990 Unspecified osteoarthritis, unspecified site: Secondary | ICD-10-CM

## 2012-04-24 DIAGNOSIS — K219 Gastro-esophageal reflux disease without esophagitis: Secondary | ICD-10-CM

## 2012-04-24 DIAGNOSIS — I872 Venous insufficiency (chronic) (peripheral): Secondary | ICD-10-CM

## 2012-04-24 DIAGNOSIS — IMO0001 Reserved for inherently not codable concepts without codable children: Secondary | ICD-10-CM

## 2012-04-24 LAB — CBC WITH DIFFERENTIAL/PLATELET
Basophils Relative: 0.5 % (ref 0.0–3.0)
HCT: 44.7 % (ref 36.0–46.0)
Hemoglobin: 14.6 g/dL (ref 12.0–15.0)
Lymphs Abs: 1.6 10*3/uL (ref 0.7–4.0)
MCHC: 32.7 g/dL (ref 30.0–36.0)
MCV: 94.3 fl (ref 78.0–100.0)
Monocytes Absolute: 0.9 10*3/uL (ref 0.1–1.0)
Neutro Abs: 3.3 10*3/uL (ref 1.4–7.7)
RDW: 14.1 % (ref 11.5–14.6)

## 2012-04-24 LAB — HEMOGLOBIN A1C: Hgb A1c MFr Bld: 6.5 % (ref 4.6–6.5)

## 2012-04-24 LAB — HEPATIC FUNCTION PANEL
AST: 26 U/L (ref 0–37)
Albumin: 3.6 g/dL (ref 3.5–5.2)
Alkaline Phosphatase: 75 U/L (ref 39–117)
Bilirubin, Direct: 0.2 mg/dL (ref 0.0–0.3)
Total Bilirubin: 0.8 mg/dL (ref 0.3–1.2)

## 2012-04-24 LAB — BASIC METABOLIC PANEL
GFR: 45.72 mL/min — ABNORMAL LOW (ref >60.00)
Glucose, Bld: 123 mg/dL — ABNORMAL HIGH (ref 70–99)
Potassium: 5 mEq/L (ref 3.5–5.1)
Sodium: 145 mEq/L (ref 135–145)

## 2012-04-24 LAB — LIPID PANEL
Total CHOL/HDL Ratio: 3
VLDL: 30.8 mg/dL (ref 0.0–40.0)

## 2012-04-24 MED ORDER — LOSARTAN POTASSIUM 50 MG PO TABS: 50.0000 mg | ORAL_TABLET | Freq: Every day | ORAL | Status: DC

## 2012-04-24 NOTE — Patient Instructions (Addendum)
Today we updated your med list in our EPIC system...    We decided to add LOSARTAN 50mg  one tab daily for your BP...  Today we did your follow up FASTING blood work...    We will call you w/ the results later in the week...  Let us know if you want to start the Memory medication- Aricept...  Increase your activity level/ exercise...  Call for any questions...  Let's plan a brief recheck in 3 months to follow up your blood pressure.Marland KitchenMarland Kitchen

## 2012-04-24 NOTE — Progress Notes (Signed)
Subjective:    Patient ID: [name], female    DOB: [birth year], 78 y.o.   MRN: [id number]  HPI 78 y/o WF here for an add-on visit due to a fall yest... she has mult medical problems as noted below...   ~  August 18, 2010:  she fell 2wks ago- hit occiput w/ hematoma on scalp> ER eval reviewed: lost balance; fell into bathtub; no LOC;  labs- OK, BS=180, +UTI given macrobid;  CT Brain= atrophy, chr microvasc dis, NAD;  CT CSpine= multilevel spondylosis & suboccipital hematoma in soft tissues w/o fx etc...  She had Ophthal f/u 3/11 DrRankin> senile macular degen OU, vitreous detach OS, bilat Drusen...  she received letter from [facility] DM & needs to be on ACE or ARB, therefore start Losartan 25mg /d...  ~  October 11, 2010:  add-on appt today after fall at home yest> she has been sl dizzy in the AMs & fell in the bathroom yest AM striking right occiput on commode- no blackout etc, didn't break skin, no hematoma but area bruised, min tender, neck OK, etc... she notes severe arthritis w/ pain & left knee gives way (right TKR in past)... she had recent infected callous tip of left 3rd hammer toe w/ drainage & antibiotic rx from DrSikora- much improved now (prob neuropathy & they are planning to get DM shoes)... BP appears well controlled on meds & no postural changes apparent, but sl higher in AMs & we discussed poss strategies w/ timing of pills etc... Plan- check CT Brain w/o contrast>   ~  February 14, 2011:  4 mo ROV- 78 y/o now & no further falls...    BP controlled on Aten, Losar, & Lasix> 110.54 today, no postural change & feeling well w/o CP, palpit, ch in SOB,syncope, etc;  CXR f/u today w/ Cardiomeg (no change), clear/ NAD;  Labs look good & BNP=211 (continue same meds);  she has AFib w/ rate control strategy> doing well on meds +ASA...    Chol remains stable on Prav40 + diet;  not fasting today> continue same...    DM controlled on her Metformin Bid + diet> BS 136, A1c 6.8; continue same Rx...   Mild renal insuffic w/ BUN 27, creat 1.5 on meds +Lasix 40mg /d; continue same for now...    Other problems: GI stable; Vit D level= 50 & supplemented w/ 2000 u daily; no recent falls w/ her new DM shows from Podiatrist...  ~  July, 31, 2012:  50mo ROV & she was [facility] 5/12 by [facility] w/ ?Pneumonia LLL (no sputum, no cultures), CHF/ DD, AFib not a coumadin cand, UTI (urine +Klebs & Enterococcus, treated w/ Avelox), & deconditioned; disch to [facility] for rehab, now back home w/ difficulty & help including home PT (not much benefit per pt, but daugh thinks it's helping);  She had prob w/ bladder, required foley, had f/u Urology & cath has been removed; currently she is more mobile, hard to rise from chair, walks some, showers, states she's ok w/ ADLs;  She is c/o a dermatitis on leg today- looks like psoriasis, try Clobetasol cream (if no better she'll need Derm refer)...  They request refills today- OK >>     We are having the usual difficult reconciling her meds from prev office, then hosp disch, then NH disch ==> now new home meds >> she is asked to bring all med bottles for review!!!  ~  October 25, 2011:  3-52mo ROV & essentially stable w/  mult minor complaints- eg.ears, heberden's nodes, urinary incont (doesn't want Urology or meds); Podiatry- DrSikora working on her toes; she is able to do her own ADLs- toilets, bathes, walks some HBP> on Coreg3.125Bid, Losartan25, Lasix20, K20; BP= 124/80 & she denies HA, CP, palpit, ch in SOB or edema... AFib & CHF> on ASA + above meds; rate control strategy, no Coumadin due to falls... Chol> on Prav40 & due for FLP (she will ret for this)... DM> on Glipiz2.5 & home BS checks ~100 she says; needs to ret for A1c... GERD> on Prilosec20 & symptoms controlled... Urology> followed by DrTannenbaum, emptying better & bladder function improved... DJD> hx severe arthritis w/ right TKR & using vicodin 2-3/d as needed... Gait Abn & Falls> on Neurontin300-2Qhs, followed by  Ortho & Podiatry...  ~  Apr 24, 2012:  2mo ROV & post hospital follow up> Adm 2/13 w/ UTI, weakness, AFib, DD> Enterococcus UTI treated w/ Levaquin, fluid balance carefully adjusted, Coreg increased (Losartan stopped), & disch to NH for rehab; back home now & ambulating w/ walker but still weak & needs home PT, exercises, etc...  We reviewed prob list, meds, xrays and labs> see below>> CXR 2/13 showed cardiomeg, clear lungs, osteopenia & TSpine degen changes... LABS 5/13:  FLP- at goals on Prav40 x TG=154;  Chems- ok x BS=123 A1c=6.5;  CBC= wnl;  TSH=1.34;  VitD= 56           Problem List:  MACULAR DEGENERATION (ICD-362.50) - on OCUVITES daily... s/p laser surg by DrRankin... she cannot see well enough to read... they tried Bilberry Juice as heard on the [facility]- it's helping... ~  3/10: had f/u w/ DrRankin- stabilized... nothing else he can do... f/u 38yr. ~  3/11:  had f/u DrRankin> senile macular degen OU (worsening), vitreous detach OS (stable), bilat Drusen...    PULMONARY NODULE (ICD-518.89) - RUL nodule, eval 2007 w/ bx = granuloma...  ~  Serial CXRs have been stable... ~  CXR 3/11 showed chr changes & calcif granulomas, prob calcif of LAD seen on lat. ~  CXR 5/12 w/ Hosp for LLL infiltrate, improved serially w/ antibiotic rx. ~  CXR 2/13 showed cardiomeg, clear lungs, osteopenia & TSpine degen changes...  HYPERTENSION (ICD-401.9) - she takes ASA 81mg /d, COREG25mg - 1/2Bid, & LASIX20mg /d, K20 added 1/2 per day... ~  9/11:  she received letter from [facility] stating she should be on ACE or ARB Rx> add Losartan 25mg /d... ~  5/12:  Hosp by [facility] w/ LLL pneumonia, UTI, CHF/ DD & meds were changed/ adjusted> Losartan & Atenolol stopped, Coreg added, Lasix decreased KCl added. ~  7/12:  1st post hosp (5/12) visit & meds reviewed & reconciled ~  11/12: On Coreg3.125Bid, Losartan25, Lasix20, K20; BP= 124/80 & she denies HA, CP, palpit, ch in SOB or edema... ~  5/13: On Coreg12.5Bid, Lasix20,  K10; BP= 140/98 & wil will restart he LOSARTAN50...  CONGESTIVE HEART FAILURE (ICD-428.0) - on above meds... ~  2DEcho 6/06 showed biatrial enlargement, mild MR/TR, norm LVF... ~  labs 3/11 showed BNP= 189... ~  Labs from 5/12 hosp reviewed in [facility]... ~  Labs from 2/13 hosp reviewed in Epic...  ATRIAL FIBRILLATION, CHRONIC (ICD-427.31) - prev on Coumadin, pt & family decided to stop Coumadin in 2007, & now on ASA daily... rate control strategy w/ COREG currently...  VENOUS INSUFFICIENCY (ICD-459.81) - on low sodium diet, and Lasix... no edema...  HYPERCHOLESTEROLEMIA (ICD-272.0) - on PRAVACHOL 40mg Qhs and tol well... ~  FLP 11/08 showed TChol  151, TG 152, HDL 38, LDL 82... > continue med & diet efforts. ~  FLP 3/09 shows TChol 206, TG 332, HDL 40, LDL 97... > cont same, get wt down! ~  FLP 3/10 showed TChol 172, TG 214, HDL 43, LDL 92... > improved- continue same. ~  FLP 3/11 showed TChol 167, TG 236, HDL 46, LDL 83 ~  FLP 9/11 on Prav40 showed TChol 144, TG 212, HDL 36, LDL 72... reminded of low fat diet rec... ~  It is difficult to get her in for FASTING blood work... ~  FLP 5/13 on Prav40 showed TChol 149, TG 154, HDL 43, LDL 75  DM (ICD-250.00) - prev on Metform500bid, stopped 5/12 Hosp & ch to GLIPIZIDE XL 2.5mg  Qam... ~  labs 11/08 showed FBS=182, HgA1c=7.1... >continue attempts at diet control. ~  labs 3/09 showed  FBS=178, HgA1c=7.2... >she will need to start meds: Metformin ER 500mg Qam. ~  labs 7/09 showed BS= 138, HgA1c= 6.7.Marland KitchenMarland Kitchenrec- keep same.  ~  labs 3/10 (wt=162#) showed 139, HgA1c= 6.6... > continue same meds. ~  labs 9/10 (wt=175#) showed BS= 158, A1c= 6.8.Marland KitchenMarland Kitchen needs better diet. ~  labs 3/11 (wt=179#) showed BS= 155, A1c= 7.6.Marland KitchenMarland Kitchen may need incr meds! get wt down. ~  labs 9/11 (wt=173#) showed BS= 166, A1c= 8.0.Marland KitchenMarland Kitchen worsening A1c- rec incr Metform to 2/d... ~  Urine microalb 9/11 = neg... ~  Labs 5/12 Hosp reviewed in EChart> off Metform, on Glipizide 2.5mg /d... ~  Labs 7/12  in office on Glip2.5 showed BS= 101, A1c= 6.7.Marland KitchenMarland Kitchen rec to continue same. ~  11/12:  daugh states BS at home all ~100 on the Glip2.5 daily; needs to ret for labs & A1c. ~  Labs 5/13 showed BS= 123, A1c= 6.5.Marland KitchenMarland Kitchen Continue same Rx.  GERD (ICD-530.81) - on PRILOSEC 20mg /d, doing well without heartburn, n/v, etc... Hx HH, GERD, and prev DU... last EGD was 7/02 showing HH, reflux, stricture, gastric ulcer... she has refused colonoscopies in the past...  UTI'S, HX OF (ICD-V13.00) - hx UTI's, s/p hysterectomy, prev bladder surg... ~  8/11:  ER visit after fall w/ UTI Rx'd w/ Macrobid... ~  Urinary incont/ bladder prob followed by DrTannenbaum...  DEGENERATIVE JOINT DISEASE (ICD-715.90)  - severe arthritis w/ prev right TKR 1990... followed by DrGioffre, DrBednarz, DrSikora (hammer toes)- not willing to consider more surg... uses Vicodin 2-3 per day as needed, shots not helpful in the past... leg discomfort may be neuropathy (but monofilament test is normal)- on Neurontin Rx taking 300mg - 2Qhs... ~  she ambulates w/ walker, she has had phys therapy & does chair exercises, she has knee braces, etc...  VITAMIN D DEFICIENCY (ICD-268.9) - not currently taking any supplement. ~  labs 7/09 showed Vit D level = 11... rec> start Vit D 50000 u weekly (but she stopped after 27mo). ~  labs 3/10 showed Vit D level = 30... rec> change to 2000 u daily. ~  Labs 3/12 showed Vit D level = 50 ~  Labs 5/13 showed Vit D level = 56  ABNORMALITY OF GAIT (ICD-781.2) & Frequent FALLS>  ~  9/11:  she fell 2wks ago- hit occiput w/ hematoma on scalp> ER eval reviewed: lost balance; fell into bathtub; no LOC;  labs- OK, BS=180, +UTI given macrobid;  CT Brain= atrophy, chr microvasc dis, NAD;  CT CSpine= multilevel spondylosis & suboccipital hematoma in soft tissues w/o fx etc...   ~  11/11:  she fell yest- hit right occiput on commode w/ bruise but no hematoma; didn't go  to the ER; no focal neuro deficits & we discussed checking CT  Brain to be sure no subdural etc... ~  11/12:  She continues weak, gait abn, but manages her own ADLs ok...  ANXIETY (ICD-300.00  DERMATITIS - uses generic LIDEX E cream as needed...  Health Maintenance:  she gets the yearly Flu vaccine each Aut[employer];  and she had PNEUMOVAX ~2005 at [facility], she says.   Past Surgical History  Procedure Date  . Cholecystectomy 6/07    Dr. Corliss Skains  . Total abdominal hysterectomy   . Bladder surgery   . Total knee arthroplasty 1990    right - Dr. Darrelyn Hillock    Outpatient Encounter Prescriptions as of 04/24/2012  Medication Sig Dispense Refill  . acetaminophen (TYLENOL) 325 MG tablet as needed - alternates this with hydrocodone       . aspirin 325 MG tablet Take 1 tablet (325 mg total) by mouth daily.      . carvedilol (COREG) 25 MG tablet 1/2 tab Twice daily  60 tablet  3  . Cholecalciferol (VITAMIN D) 2000 UNITS CAPS Take 2,000 Units by mouth daily. 1 capsule by mouth once daily      . furosemide (LASIX) 20 MG tablet Take 1 tablet (20 mg total) by mouth daily.  30 tablet  11  . gabapentin (NEURONTIN) 300 MG capsule Take 600 mg by mouth at bedtime.      Marland Kitchen glipiZIDE (GLUCOTROL XL) 5 MG 24 hr tablet Take 2.5 mg by mouth daily with breakfast.      . omeprazole (PRILOSEC) 20 MG capsule Take 20 mg by mouth daily. Take 1 capsule by mouth 30 minutes before first meal of the day      . potassium chloride SA (K-DUR,KLOR-CON) 20 MEQ tablet Take 1 tablet (20 mEq total) by mouth daily.  30 tablet  11  . pravastatin (PRAVACHOL) 40 MG tablet Take 40 mg by mouth daily.      Marland Kitchen VICODIN 5-500 MG per tablet TAKE 1 TABLET EVERY 4-6 HOURS AS NEEDED FOR PAIN. DO NOT EXCEED 3 PERDAY. MAY MAKE DROWSY.  90 each  1  . DISCONTD: BILBERRY, VACCINIUM MYRTILLUS, PO Take 320 mg by mouth daily.      Marland Kitchen DISCONTD: Multiple Vitamins-Minerals (OCUVITE PO) Take 2 tablets by mouth daily.         Allergies  Allergen Reactions  . Celecoxib     GI discomfort  . Diclofenac Sodium     REACTION:  causing burining sensation after use    Current Medications, Allergies, Past Medical History, Past Surgical History, Family History, and Social History were reviewed in [employer] record.    Review of Systems    See HPI - all other systems neg except as noted...  The patient complains of vision loss, decreased hearing, dyspnea on exertion, muscle weakness, and difficulty walking.  The patient denies anorexia, fever, weight loss, weight gain, hoarseness, chest pain, syncope, peripheral edema, prolonged cough, headaches, hemoptysis, abdominal pain, melena, hematochezia, severe indigestion/heartburn, hematuria, incontinence, suspicious skin lesions, transient blindness, depression, unusual weight change, abnormal bleeding, enlarged lymph nodes, and angioedema.   Objective:   Physical Exam     WD, WN, 78 y/o WF chr ill appearing but in NAD... GENERAL:  Alert & oriented; pleasant & cooperative... sm bruise right occiput, no hematoma. HEENT:  [place]/AT, EOM-wnl, poor vision from macular degen; EACs-clear, TMs-wnl, NOSE-clear, THROAT-clear w/ dry MM's. NECK:  Supple w/ fairROM; no JVD; normal carotid impulses w/o bruits; no  thyromegaly or nodules palpated; no lymphadenopathy. CHEST:  Clear to P & A; without wheezes/ rales/ or rhonchi. HEART:  irreg, gr 1/6 SEM without rubs or gallops detected... ABDOMEN:  Soft & nontender; normal bowel sounds; no organomegaly or masses detected. EXT:  severe arthritic changes & rightTKR; no varicose veins/ +venous insuffic/ tr edema. NEURO:  CN's intact; +gait abn, no focal neuro deficits x sl decr sensation in LE's... DERM:  No lesions noted; no rash etc...  RADIOLOGY DATA:  Reviewed in the EPIC EMR & discussed w/ the patient...  LABORATORY DATA:  Reviewed in the EPIC EMR & discussed w/ the patient...   Assessment & Plan:   HBP/ CHF (DD)/ AFib>  meds were changed, adjusted, & now reconciled betw prev office, hosp, NH, & back home;   Asked to bring all bottles to office for review... We decided to restart LOSARTAN 50mg /d for BP...  CHOL>  Continue Prav40 Qhs and take regularly, needs to ret for FLP...  DM>  meds were changed, adjusted, & now reconciled since her prev Hosp;  Off Metform & now on Glipizide (low dose) w/ QM578 & A1c 6.5; reminded to eat well & regularly to avoid hypogly on the sulfonylurea rx started in last hosp (NOTE: Creat=1.2 & she could certainly return to Metformin therapy if needed)...  GERD>  Stable on PPI rx daily...  UTIs>  Treated during the 5/12 & 2/13 hosp & NH stay; she had prob w/ bladder & followed by DrTannenbaum...  DJD/ Gait Abn/ etc>  She is getting home therapy...  Dermatitis>  Try the Clobetasol cream...  Other medical problems as noted....   Patient's Medications  New Prescriptions   No medications on file  Previous Medications   ACETAMINOPHEN (TYLENOL) 325 MG TABLET    as needed - alternates this with hydrocodone    ASPIRIN 325 MG TABLET    Take 1 tablet (325 mg total) by mouth daily.   CARVEDILOL (COREG) 25 MG TABLET    1/2 tab Twice daily   CHOLECALCIFEROL (VITAMIN D) 2000 UNITS CAPS    Take 2,000 Units by mouth daily. 1 capsule by mouth once daily   FUROSEMIDE (LASIX) 20 MG TABLET    Take 1 tablet (20 mg total) by mouth daily.   GABAPENTIN (NEURONTIN) 300 MG CAPSULE    Take 600 mg by mouth at bedtime.   GLIPIZIDE (GLUCOTROL XL) 5 MG 24 HR TABLET    Take 2.5 mg by mouth daily with breakfast.   OMEPRAZOLE (PRILOSEC) 20 MG CAPSULE    Take 20 mg by mouth daily. Take 1 capsule by mouth 30 minutes before first meal of the day   POTASSIUM CHLORIDE SA (K-DUR,KLOR-CON) 20 MEQ TABLET    Take 1 tablet (20 mEq total) by mouth daily.   PRAVASTATIN (PRAVACHOL) 40 MG TABLET    Take 40 mg by mouth daily.   VICODIN 5-500 MG PER TABLET    TAKE 1 TABLET EVERY 4-6 HOURS AS NEEDED FOR PAIN. DO NOT EXCEED 3 PERDAY. MAY MAKE DROWSY.  Modified Medications   No medications on file  Discontinued  Medications   BILBERRY, VACCINIUM MYRTILLUS, PO    Take 320 mg by mouth daily.   MULTIPLE VITAMINS-MINERALS (OCUVITE PO)    Take 2 tablets by mouth daily.

## 2012-04-24 NOTE — Telephone Encounter (Signed)
Pt was seen today by SN.  Med list was updated and the daughter is now calling back stating that the pt is only taking 1/2 of the 5 mg of the glipizide.  Will notify SN and update pts chart.

## 2012-04-24 NOTE — Telephone Encounter (Signed)
Called and spoke with peggy and she is aware that the pts med list has been updated.

## 2012-04-25 LAB — VITAMIN D 25 HYDROXY (VIT D DEFICIENCY, FRACTURES): Vit D, 25-Hydroxy: 56 ng/mL (ref 30–89)

## 2012-05-28 ENCOUNTER — Telehealth: Payer: Self-pay | Admitting: Pulmonary Disease

## 2012-05-28 NOTE — Telephone Encounter (Signed)
Pt's daughter called back & stated that pt's BP is high.  Would like to speak w/ someone ASAP.  Antionette Fairy

## 2012-05-28 NOTE — Telephone Encounter (Signed)
Per SN---rec to take lasix 40mg   Every day in the morning, keep the coreg 1/2 bid and give losartan 50mg   With PM coreg.      So in the am take the coreg 1/2 tab and lasix 40mg   And in the evening take coreg 1/2 tab and the losartan 50mg .  thanks

## 2012-05-28 NOTE — Telephone Encounter (Signed)
I spoke with [facility] and she states pt's BP yesterday AM was 214/120, 2 hrs later 138/82 2 hrs later 204/86. Today at 8 am 219/101 at 11 am 125/62. Pt also has had elevated pulse during all this, both legs are swollen, she can hardly walk. Pt had fell yesterday but did not break anything and is fine. She is taking coreg 25 mg 1/2 BID, losartan 50 QD, and lasix 20 mg QD. Daughter requesting further recs from Sn. Please advise thanks  Allergies  Allergen Reactions  . Celecoxib     GI discomfort  . Diclofenac Sodium     REACTION: causing burining sensation after use

## 2012-05-28 NOTE — Telephone Encounter (Signed)
Called, spoke with [name].  Advised SN recs pt take lasix 40mg  every day in the morning, she should keep the coreg at 1/2 tablet bid, and losartan 50 mg with the PM dose of coreg.  She is aware to have pt take the coreg 1/2 tab and lasix 40 mg in the am and in the evening take coreg 1/2 tab along with the losartan 50 mg.  Advised if pt's symptoms do not improve or worsen, pls call back or seek emergency care, ED or Urgent Care, if needed.  [name] verbalized understanding of these instructions.    Note:  I have changed lasix from 20 mg to 40 mg qd on pt's med list to reflect this change.  Also, [name] will have pt take 2 of the 20 mg tablets they have of lasix for now.  When she gets low, she will call for a new rx for lasix 40 mg.

## 2012-05-31 ENCOUNTER — Telehealth: Payer: Self-pay | Admitting: Pulmonary Disease

## 2012-05-31 MED ORDER — FUROSEMIDE 40 MG PO TABS: 40.0000 mg | ORAL_TABLET | Freq: Every day | ORAL | Status: DC

## 2012-05-31 NOTE — Telephone Encounter (Signed)
Spoke with pt and notified that rx was sent to pharm for lasix 40 mg. Nothing further needed.

## 2012-06-11 ENCOUNTER — Other Ambulatory Visit: Payer: Self-pay | Admitting: Pulmonary Disease

## 2012-07-05 ENCOUNTER — Telehealth: Payer: Self-pay | Admitting: Pulmonary Disease

## 2012-07-05 MED ORDER — GLIPIZIDE ER 5 MG PO TB24: ORAL_TABLET | ORAL | Status: DC

## 2012-07-05 NOTE — Telephone Encounter (Signed)
Spoke with pt's daughter Gigi Gin to verify the msg. Rx for glipizide was sent to pharm. Nothing further needed.

## 2012-07-09 ENCOUNTER — Other Ambulatory Visit: Payer: Self-pay | Admitting: Pulmonary Disease

## 2012-07-10 ENCOUNTER — Telehealth: Payer: Self-pay | Admitting: Pulmonary Disease

## 2012-07-10 MED ORDER — HYDROCODONE-ACETAMINOPHEN 5-500 MG PO TABS: ORAL_TABLET | ORAL | Status: DC

## 2012-07-10 NOTE — Telephone Encounter (Signed)
We can call in refill for the pt----the refill is not due until 8-8 so the pharmacy may not fill it until that time.   We are not allowed to give early refills on these types of meds.  thanks

## 2012-07-10 NOTE — Telephone Encounter (Signed)
i spoke with Gigi Gin and she stated pt needs refill on her vicodin 5-500. Last refilled 06/12/12 #90 x 0 refills. Please advise SN thanks

## 2012-07-10 NOTE — Telephone Encounter (Signed)
I spoke with [name] and is aware of this. She states she just likes this called in early so pt can pick up on time for when she needs this. Rx has been called into the pharmacy

## 2012-07-25 ENCOUNTER — Ambulatory Visit (INDEPENDENT_AMBULATORY_CARE_PROVIDER_SITE_OTHER): Payer: Medicare Other | Admitting: Pulmonary Disease

## 2012-07-25 ENCOUNTER — Encounter: Payer: Self-pay | Admitting: Pulmonary Disease

## 2012-07-25 VITALS — BP 122/72 | HR 77 | Temp 96.8°F | Ht 63.0 in | Wt 173.2 lb

## 2012-07-25 DIAGNOSIS — R269 Unspecified abnormalities of gait and mobility: Secondary | ICD-10-CM

## 2012-07-25 DIAGNOSIS — I4891 Unspecified atrial fibrillation: Secondary | ICD-10-CM

## 2012-07-25 DIAGNOSIS — K219 Gastro-esophageal reflux disease without esophagitis: Secondary | ICD-10-CM

## 2012-07-25 DIAGNOSIS — N289 Disorder of kidney and ureter, unspecified: Secondary | ICD-10-CM

## 2012-07-25 DIAGNOSIS — F411 Generalized anxiety disorder: Secondary | ICD-10-CM

## 2012-07-25 DIAGNOSIS — E119 Type 2 diabetes mellitus without complications: Secondary | ICD-10-CM

## 2012-07-25 DIAGNOSIS — I509 Heart failure, unspecified: Secondary | ICD-10-CM

## 2012-07-25 DIAGNOSIS — I10 Essential (primary) hypertension: Secondary | ICD-10-CM

## 2012-07-25 DIAGNOSIS — I872 Venous insufficiency (chronic) (peripheral): Secondary | ICD-10-CM

## 2012-07-25 DIAGNOSIS — M1990 Unspecified osteoarthritis, unspecified site: Secondary | ICD-10-CM

## 2012-07-25 DIAGNOSIS — E7800 Pure hypercholesterolemia, unspecified: Secondary | ICD-10-CM

## 2012-07-25 MED ORDER — GABAPENTIN 300 MG PO CAPS: 600.0000 mg | ORAL_CAPSULE | Freq: Every day | ORAL | Status: DC

## 2012-07-25 MED ORDER — PRAVASTATIN SODIUM 40 MG PO TABS: 40.0000 mg | ORAL_TABLET | Freq: Every day | ORAL | Status: DC

## 2012-07-25 NOTE — Progress Notes (Signed)
Subjective:    Patient ID: [name], female    DOB: [birth year], 78 y.o.   MRN: [id number]  HPI 78 y/o WF here for an add-on visit due to a fall yest... she has mult medical problems as noted below...   ~  August 18, 2010:  she fell 2wks ago- hit occiput w/ hematoma on scalp> ER eval reviewed: lost balance; fell into bathtub; no LOC;  labs- OK, BS=180, +UTI given macrobid;  CT Brain= atrophy, chr microvasc dis, NAD;  CT CSpine= multilevel spondylosis & suboccipital hematoma in soft tissues w/o fx etc...  She had Ophthal f/u 3/11 DrRankin> senile macular degen OU, vitreous detach OS, bilat Drusen...  she received letter from [facility] DM & needs to be on ACE or ARB, therefore start Losartan 25mg /d...  ~  October 11, 2010:  add-on appt today after fall at home yest> she has been sl dizzy in the AMs & fell in the bathroom yest AM striking right occiput on commode- no blackout etc, didn't break skin, no hematoma but area bruised, min tender, neck OK, etc... she notes severe arthritis w/ pain & left knee gives way (right TKR in past)... she had recent infected callous tip of left 3rd hammer toe w/ drainage & antibiotic rx from DrSikora- much improved now (prob neuropathy & they are planning to get DM shoes)... BP appears well controlled on meds & no postural changes apparent, but sl higher in AMs & we discussed poss strategies w/ timing of pills etc... Plan- check CT Brain w/o contrast>   ~  February 14, 2011:  4 mo ROV- 78 y/o now & no further falls...    BP controlled on Aten, Losar, & Lasix> 110.54 today, no postural change & feeling well w/o CP, palpit, ch in SOB,syncope, etc;  CXR f/u today w/ Cardiomeg (no change), clear/ NAD;  Labs look good & BNP=211 (continue same meds);  she has AFib w/ rate control strategy> doing well on meds +ASA...    Chol remains stable on Prav40 + diet;  not fasting today> continue same...    DM controlled on her Metformin Bid + diet> BS 136, A1c 6.8; continue same Rx...   Mild renal insuffic w/ BUN 27, creat 1.5 on meds +Lasix 40mg /d; continue same for now...    Other problems: GI stable; Vit D level= 50 & supplemented w/ 2000 u daily; no recent falls w/ her new DM shows from Podiatrist...  ~  July, 31, 2012:  50mo ROV & she was [facility] 5/12 by [facility] w/ ?Pneumonia LLL (no sputum, no cultures), CHF/ DD, AFib not a coumadin cand, UTI (urine +Klebs & Enterococcus, treated w/ Avelox), & deconditioned; disch to [facility] for rehab, now back home w/ difficulty & help including home PT (not much benefit per pt, but daugh thinks it's helping);  She had prob w/ bladder, required foley, had f/u Urology & cath has been removed; currently she is more mobile, hard to rise from chair, walks some, showers, states she's ok w/ ADLs;  She is c/o a dermatitis on leg today- looks like psoriasis, try Clobetasol cream (if no better she'll need Derm refer)...  They request refills today- OK >>     We are having the usual difficult reconciling her meds from prev office, then hosp disch, then NH disch ==> now new home meds >> she is asked to bring all med bottles for review!!!  ~  October 25, 2011:  3-52mo ROV & essentially stable w/  mult minor complaints- eg.ears, heberden's nodes, urinary incont (doesn't want Urology or meds); Podiatry- DrSikora working on her toes; she is able to do her own ADLs- toilets, bathes, walks some    HBP> on Coreg3.125Bid, Losartan25, Lasix20, K20; BP= 124/80 & she denies HA, CP, palpit, ch in SOB or edema...    AFib & CHF> on ASA + above meds; rate control strategy, no Coumadin due to falls...    Chol> on Prav40 & due for FLP (she will ret for this)...    DM> on Glipiz2.5 & home BS checks ~100 she says; needs to ret for A1c...    GERD> on Prilosec20 & symptoms controlled...    Urology> followed by DrTannenbaum, emptying better & bladder function improved...    DJD> hx severe arthritis w/ right TKR & using vicodin 2-3/d as needed...    Gait Abn & Falls> on  Neurontin300-2Qhs, followed by Ortho & Podiatry...  ~  Apr 24, 2012:  87mo ROV & post hospital follow up> Adm 2/13 w/ UTI, weakness, AFib, DD> Enterococcus UTI treated w/ Levaquin, fluid balance carefully adjusted, Coreg increased (Losartan stopped), & disch to NH for rehab; back home now & ambulating w/ walker but still weak & needs home PT, exercises, etc...  We reviewed prob list, meds, xrays and labs> see below>> CXR 2/13 showed cardiomeg, clear lungs, osteopenia & TSpine degen changes... LABS 5/13:  FLP- at goals on Prav40 x TG=154;  Chems- ok x BS=123 A1c=6.5;  CBC= wnl;  TSH=1.34;  VitD= 56   ~  July 25, 2012:  29mo ROV & daughter notes that she is less mobile, walking less due to trouble w/ vision & arthritis; incr stress as the daugh with whom she lives has been Dx w/ MultMyeloma on chemo now & other daugh (here today) has been trying to care for both;  Pt able to bathe 7 dress self but difficulty w/ mobility- offered PT/ OT but daugh says she refused to coop w/ Rx in past...    BP controlled on meds;  Chol & DM have been under adeq control for this 78 y/o lady;  She uses Vicodin as needed for pain...     We reviewed prob list, meds, xrays and labs> see below for updates >>           Problem List:  MACULAR DEGENERATION (ICD-362.50) - on OCUVITES daily... s/p laser surg by DrRankin... she cannot see well enough to read... they tried Bilberry Juice as heard on the [facility] [employer]- it's helping... ~  3/10: had f/u w/ DrRankin- stabilized... nothing else he can do... f/u 40yr. ~  3/11:  had f/u DrRankin> senile macular degen OU (worsening), vitreous detach OS (stable), bilat Drusen...    PULMONARY NODULE (ICD-518.89) - RUL nodule, eval 2007 w/ bx = granuloma...  ~  Serial CXRs have been stable... ~  CXR 3/11 showed chr changes & calcif granulomas, prob calcif of LAD seen on lat. ~  CXR 5/12 w/ Hosp for LLL infiltrate, improved serially w/ antibiotic rx. ~  CXR 2/13 showed cardiomeg, clear  lungs, osteopenia & TSpine degen changes...  HYPERTENSION (ICD-401.9) - she takes ASA 325mg /d, COREG25mg - 1/2Bid, LOSARTAN50mg /d, & LASIX40mg /d, K20 added 1/2 per day... ~  9/11:  she received letter from [facility] stating she should be on ACE or ARB Rx> add Losartan 25mg /d... ~  5/12:  Hosp by [facility] w/ LLL pneumonia, UTI, CHF/ DD & meds were changed/ adjusted> Losartan & Atenolol stopped, Coreg added, Lasix decreased KCl added. ~  7/12:  1st post hosp (5/12) visit & meds reviewed & reconciled ~  11/12: On Coreg3.125Bid, Losartan25, Lasix20, K20; BP= 124/80 & she denies HA, CP, palpit, ch in SOB or edema... ~  5/13: On Coreg12.5Bid, Lasix20, K10; BP= 140/98 & wil will restart he LOSARTAN50... ~  8/13:  On Coreg12,5Bid, Losartan50, Lasix40, K20-1/2> BP= 122/72 & she denies CP, palpit, ch in SOB or edema...  CONGESTIVE HEART FAILURE (ICD-428.0) - on above meds... ~  2DEcho 6/06 showed biatrial enlargement, mild MR/TR, norm LVF... ~  labs 3/11 showed BNP= 189... ~  Labs from 5/12 hosp reviewed in [facility]... ~  Labs from 2/13 hosp reviewed in Epic...  ATRIAL FIBRILLATION, CHRONIC (ICD-427.31) - prev on Coumadin, pt & family decided to stop Coumadin in 2007, & now on ASA daily... rate control strategy w/ COREG currently...  VENOUS INSUFFICIENCY (ICD-459.81) - on low sodium diet, and Lasix... no edema...  HYPERCHOLESTEROLEMIA (ICD-272.0) - on PRAVACHOL 40mg Qhs and tol well... ~  FLP 11/08 showed TChol 151, TG 152, HDL 38, LDL 82... > continue med & diet efforts. ~  FLP 3/09 shows TChol 206, TG 332, HDL 40, LDL 97... > cont same, get wt down! ~  FLP 3/10 showed TChol 172, TG 214, HDL 43, LDL 92... > improved- continue same. ~  FLP 3/11 showed TChol 167, TG 236, HDL 46, LDL 83 ~  FLP 9/11 on Prav40 showed TChol 144, TG 212, HDL 36, LDL 72... reminded of low fat diet rec... ~  It is difficult to get her in for FASTING blood work... ~  FLP 5/13 on Prav40 showed TChol 149, TG 154, HDL 43, LDL 75  DM  (ICD-250.00) - prev on Metform500bid, stopped 5/12 Hosp & ch to GLIPIZIDE XL 2.5mg  Qam... ~  labs 11/08 showed FBS=182, HgA1c=7.1... >continue attempts at diet control. ~  labs 3/09 showed  FBS=178, HgA1c=7.2... >she will need to start meds: Metformin ER 500mg Qam. ~  labs 7/09 showed BS= 138, HgA1c= 6.7.Marland KitchenMarland Kitchenrec- keep same.  ~  labs 3/10 (wt=162#) showed 139, HgA1c= 6.6... > continue same meds. ~  labs 9/10 (wt=175#) showed BS= 158, A1c= 6.8.Marland KitchenMarland Kitchen needs better diet. ~  labs 3/11 (wt=179#) showed BS= 155, A1c= 7.6.Marland KitchenMarland Kitchen may need incr meds! get wt down. ~  labs 9/11 (wt=173#) showed BS= 166, A1c= 8.0.Marland KitchenMarland Kitchen worsening A1c- rec incr Metform to 2/d... ~  Urine microalb 9/11 = neg... ~  Labs 5/12 Hosp reviewed in EChart> off Metform, on Glipizide 2.5mg /d... ~  Labs 7/12 in office on Glip2.5 showed BS= 101, A1c= 6.7.Marland KitchenMarland Kitchen rec to continue same. ~  11/12:  daugh states BS at home all ~100 on the Glip2.5 daily; needs to ret for labs & A1c. ~  Labs 5/13 showed BS= 123, A1c= 6.5.Marland KitchenMarland Kitchen Continue same Rx. ~  She reports Podiatric care from DrSikora- hammertoes, trims callouses Q74mo...  GERD (ICD-530.81) - on PRILOSEC 20mg /d, doing well without heartburn, n/v, etc... Hx HH, GERD, and prev DU... last EGD was 7/02 showing HH, reflux, stricture, gastric ulcer... she has refused colonoscopies in the past...  UTI'S, HX OF (ICD-V13.00) - hx UTI's, s/p hysterectomy, prev bladder surg... ~  8/11:  ER visit after fall w/ UTI Rx'd w/ Macrobid... ~  Urinary incont/ bladder prob followed by DrTannenbaum...  DEGENERATIVE JOINT DISEASE (ICD-715.90)  - severe arthritis w/ prev right TKR 1990... followed by DrGioffre, DrBednarz, DrSikora (hammer toes)- not willing to consider more surg... uses Vicodin 2-3 per day as needed, shots not helpful in the past... leg discomfort may be  neuropathy (but monofilament test is normal)- on NEURONTIN Rx taking 300mg - 2Qhs, & VICODIN as needed... ~  she ambulates w/ walker, she has had phys therapy & does  chair exercises, she has knee braces, etc...  VITAMIN D DEFICIENCY (ICD-268.9) - not currently taking any supplement. ~  labs 7/09 showed Vit D level = 11... rec> start Vit D 50000 u weekly (but she stopped after 79mo). ~  labs 3/10 showed Vit D level = 30... rec> change to 2000 u daily. ~  Labs 3/12 showed Vit D level = 50 ~  Labs 5/13 showed Vit D level = 56  ABNORMALITY OF GAIT (ICD-781.2) & Frequent FALLS>  ~  9/11:  she fell 2wks ago- hit occiput w/ hematoma on scalp> ER eval reviewed: lost balance; fell into bathtub; no LOC;  labs- OK, BS=180, +UTI given macrobid;  CT Brain= atrophy, chr microvasc dis, NAD;  CT CSpine= multilevel spondylosis & suboccipital hematoma in soft tissues w/o fx etc...   ~  11/11:  she fell yest- hit right occiput on commode w/ bruise but no hematoma; didn't go to the ER; no focal neuro deficits & we discussed checking CT Brain to be sure no subdural etc... ~  11/12:  She continues weak, gait abn, but manages her own ADLs ok...  ANXIETY (ICD-300.00  DERMATITIS - uses generic LIDEX E cream as needed...  Health Maintenance:  she gets the yearly Flu vaccine each Autumn;  and she had P[facility]UMOVAX ~[facility] at [facility], she says.   Past Surgical History  Procedure Date  . Cholecystectomy 6/07    Dr. Corliss Skains  . Total abdominal hysterectomy   . Bladder surgery   . Total knee arthroplasty 1990    right - Dr. Darrelyn Hillock    Outpatient Encounter Prescriptions as of 07/25/2012  Medication Sig Dispense Refill  . acetaminophen (TYLENOL) 325 MG tablet as needed - alternates this with hydrocodone       . aspirin 325 MG tablet Take 1 tablet (325 mg total) by mouth daily.      . carvedilol (COREG) 25 MG tablet 1/2 tab Twice daily  60 tablet  3  . Cholecalciferol (VITAMIN D) 2000 UNITS CAPS Take 2,000 Units by mouth daily. 1 capsule by mouth once daily      . furosemide (LASIX) 20 MG tablet Take 40 mg by mouth daily.      . furosemide (LASIX) 40 MG tablet Take 1 tablet (40 mg total)  by mouth daily.  30 tablet  11  . gabapentin (NEURONTIN) 300 MG capsule Take 600 mg by mouth at bedtime.      Marland Kitchen glipiZIDE (GLUCOTROL XL) 5 MG 24 hr tablet Take 1/2 tablet by mouth daily  15 tablet  11  . HYDROcodone-acetaminophen (VICODIN) 5-500 MG per tablet Take one tablet by mouth every 6-8 hours as needed for pain.  DO NOT EXCEED 3 PER DAY. MAY MAKE PT DROWSY.  90 tablet  0  . losartan (COZAAR) 50 MG tablet Take 1 tablet (50 mg total) by mouth daily.  30 tablet  11  . omeprazole (PRILOSEC) 20 MG capsule Take 20 mg by mouth daily. Take 1 capsule by mouth 30 minutes before first meal of the day      . potassium chloride SA (K-DUR,KLOR-CON) 20 MEQ tablet Take 1/2 tablet by mouth daily      . pravastatin (PRAVACHOL) 40 MG tablet Take 40 mg by mouth daily.        Allergies  Allergen Reactions  .  Celecoxib     GI discomfort  . Diclofenac Sodium     REACTION: causing burining sensation after use    Current Medications, Allergies, Past Medical History, Past Surgical History, Family History, and Social History were reviewed in [employer] record.    Review of Systems    See HPI - all other systems neg except as noted...  The patient complains of vision loss, decreased hearing, dyspnea on exertion, muscle weakness, and difficulty walking.  The patient denies anorexia, fever, weight loss, weight gain, hoarseness, chest pain, syncope, peripheral edema, prolonged cough, headaches, hemoptysis, abdominal pain, melena, hematochezia, severe indigestion/heartburn, hematuria, incontinence, suspicious skin lesions, transient blindness, depression, unusual weight change, abnormal bleeding, enlarged lymph nodes, and angioedema.   Objective:   Physical Exam     WD, WN, 78 y/o WF chr ill appearing but in NAD... GENERAL:  Alert & oriented; pleasant & cooperative... sm bruise right occiput, no hematoma. HEENT:  [place]/AT, EOM-wnl, poor vision from macular degen; EACs-clear, TMs-wnl,  NOSE-clear, THROAT-clear w/ dry MM's. NECK:  Supple w/ fairROM; no JVD; normal carotid impulses w/o bruits; no thyromegaly or nodules palpated; no lymphadenopathy. CHEST:  Clear to P & A; without wheezes/ rales/ or rhonchi. HEART:  irreg, gr 1/6 SEM without rubs or gallops detected... ABDOMEN:  Soft & nontender; normal bowel sounds; no organomegaly or masses detected. EXT:  severe arthritic changes & rightTKR; no varicose veins/ +venous insuffic/ tr edema. NEURO:  CN's intact; +gait abn, no focal neuro deficits x sl decr sensation in LE's... DERM:  No lesions noted; no rash etc...  RADIOLOGY DATA:  Reviewed in the EPIC EMR & discussed w/ the patient...  LABORATORY DATA:  Reviewed in the EPIC EMR & discussed w/ the patient...   Assessment & Plan:   HBP/ CHF (DD)/ AFib>  meds were changed, adjusted, & now reconciled betw prev office, hosp, NH, & back home;  Asked to bring all bottles to office for review==> she never did!  rec to continue her regimen...  CHOL>  Continue Prav40 Qhs and take regularly, needs to ret for FLP...  DM>  meds were changed, adjusted, & now reconciled since her prev Hosp;  Off Metform & now on Glipizide (low dose) w/ [id number] & A1c 6.5 ([id number]); reminded to eat well & regularly to avoid hypogly on the sulfonylurea rx started in last hosp (NOTE: Creat=1.2 & GFR=46, she could return to Metformin therapy if needed)...  GERD>  Stable on PPI rx daily...  UTIs>  Treated during the 5/12 & 2/13 hosp & NH stay; she had prob w/ bladder & followed by DrTannenbaum...  DJD/ Gait Abn/ etc>  She finished home therapy 7 drifted back into her old habits; encouraged to incr exercise, etc...  Dermatitis>  Try the Clobetasol cream...  Other medical problems as noted....   Patient's Medications  New Prescriptions   No medications on file  Previous Medications   ACETAMINOPHEN (TYLENOL) 325 MG TABLET    as needed - alternates this with hydrocodone    ASPIRIN 325 MG TABLET     Take 1 tablet (325 mg total) by mouth daily.   CARVEDILOL (COREG) 25 MG TABLET    1/2 tab Twice daily   CHOLECALCIFEROL (VITAMIN D) 2000 UNITS CAPS    Take 2,000 Units by mouth daily. 1 capsule by mouth once daily   FUROSEMIDE (LASIX) 40 MG TABLET    Take 1 tablet (40 mg total) by mouth daily.   GLIPIZIDE (GLUCOTROL XL) 5 MG  24 HR TABLET    Take 1/2 tablet by mouth daily   HYDROCODONE-ACETAMINOPHEN (VICODIN) 5-500 MG PER TABLET    Take one tablet by mouth every 6-8 hours as needed for pain.  DO NOT EXCEED 3 PER DAY. MAY MAKE PT DROWSY.   LOSARTAN (COZAAR) 50 MG TABLET    Take 1 tablet (50 mg total) by mouth daily.   OMEPRAZOLE (PRILOSEC) 20 MG CAPSULE    Take 20 mg by mouth daily. Take 1 capsule by mouth 30 minutes before first meal of the day   POTASSIUM CHLORIDE SA (K-DUR,KLOR-CON) 20 MEQ TABLET    Take 1/2 tablet by mouth daily  Modified Medications   Modified Medication Previous Medication   GABAPENTIN (NEURONTIN) 300 MG CAPSULE gabapentin (NEURONTIN) 300 MG capsule      Take 2 capsules (600 mg total) by mouth at bedtime.    Take 600 mg by mouth at bedtime.   PRAVASTATIN (PRAVACHOL) 40 MG TABLET pravastatin (PRAVACHOL) 40 MG tablet      Take 1 tablet (40 mg total) by mouth daily.    Take 40 mg by mouth daily.  Discontinued Medications   FUROSEMIDE (LASIX) 20 MG TABLET    Take 40 mg by mouth daily.

## 2012-07-25 NOTE — Patient Instructions (Addendum)
Today we updated your med list in our EPIC system...    Continue your current medications the same...    We refilled the meds you requested...  Keep up your exercise program as best you can to maintain your mobility...  Call for any questions...  Let's plan a follow up visit in 6 months w/ FASTING blood work.Marland KitchenMarland Kitchen

## 2012-08-14 ENCOUNTER — Other Ambulatory Visit: Payer: Self-pay | Admitting: Pulmonary Disease

## 2012-08-14 ENCOUNTER — Telehealth: Payer: Self-pay | Admitting: Pulmonary Disease

## 2012-08-14 MED ORDER — HYDROCODONE-ACETAMINOPHEN 5-325 MG PO TABS: 1.0000 | ORAL_TABLET | Freq: Three times a day (TID) | ORAL | Status: AC | PRN

## 2012-08-14 NOTE — Telephone Encounter (Signed)
The rx was already called to pharm today by Leigh.  Spoke with pt and notified that this was done.  Pt states nothing further needed.

## 2012-08-22 ENCOUNTER — Other Ambulatory Visit: Payer: Self-pay | Admitting: Pulmonary Disease

## 2012-10-04 ENCOUNTER — Other Ambulatory Visit: Payer: Self-pay | Admitting: Adult Health

## 2013-01-21 ENCOUNTER — Telehealth: Payer: Self-pay | Admitting: Pulmonary Disease

## 2013-01-21 ENCOUNTER — Encounter: Payer: Self-pay | Admitting: Pulmonary Disease

## 2013-01-21 ENCOUNTER — Other Ambulatory Visit (INDEPENDENT_AMBULATORY_CARE_PROVIDER_SITE_OTHER): Payer: Medicare Other

## 2013-01-21 ENCOUNTER — Ambulatory Visit (INDEPENDENT_AMBULATORY_CARE_PROVIDER_SITE_OTHER): Payer: Medicare Other | Admitting: Pulmonary Disease

## 2013-01-21 ENCOUNTER — Ambulatory Visit (INDEPENDENT_AMBULATORY_CARE_PROVIDER_SITE_OTHER)
Admission: RE | Admit: 2013-01-21 | Discharge: 2013-01-21 | Disposition: A | Discharge status: Home / Self Care | Payer: Medicare Other | Source: Ambulatory Visit | Attending: Pulmonary Disease | Admitting: Pulmonary Disease

## 2013-01-21 VITALS — BP 126/88 | HR 89 | Temp 97.3°F | Ht 63.0 in | Wt 186.2 lb

## 2013-01-21 DIAGNOSIS — R0609 Other forms of dyspnea: Secondary | ICD-10-CM

## 2013-01-21 DIAGNOSIS — I872 Venous insufficiency (chronic) (peripheral): Secondary | ICD-10-CM

## 2013-01-21 DIAGNOSIS — F411 Generalized anxiety disorder: Secondary | ICD-10-CM

## 2013-01-21 DIAGNOSIS — L309 Dermatitis, unspecified: Secondary | ICD-10-CM

## 2013-01-21 DIAGNOSIS — K219 Gastro-esophageal reflux disease without esophagitis: Secondary | ICD-10-CM

## 2013-01-21 DIAGNOSIS — I10 Essential (primary) hypertension: Secondary | ICD-10-CM

## 2013-01-21 DIAGNOSIS — E7800 Pure hypercholesterolemia, unspecified: Secondary | ICD-10-CM

## 2013-01-21 DIAGNOSIS — J984 Other disorders of lung: Secondary | ICD-10-CM

## 2013-01-21 DIAGNOSIS — R269 Unspecified abnormalities of gait and mobility: Secondary | ICD-10-CM

## 2013-01-21 DIAGNOSIS — M1990 Unspecified osteoarthritis, unspecified site: Secondary | ICD-10-CM

## 2013-01-21 DIAGNOSIS — E119 Type 2 diabetes mellitus without complications: Secondary | ICD-10-CM

## 2013-01-21 DIAGNOSIS — I509 Heart failure, unspecified: Secondary | ICD-10-CM

## 2013-01-21 DIAGNOSIS — R0689 Other abnormalities of breathing: Secondary | ICD-10-CM

## 2013-01-21 DIAGNOSIS — I4891 Unspecified atrial fibrillation: Secondary | ICD-10-CM

## 2013-01-21 DIAGNOSIS — E559 Vitamin D deficiency, unspecified: Secondary | ICD-10-CM

## 2013-01-21 DIAGNOSIS — R0600 Dyspnea, unspecified: Secondary | ICD-10-CM

## 2013-01-21 LAB — CBC WITH DIFFERENTIAL/PLATELET
Basophils Absolute: 0 10*3/uL (ref 0.0–0.1)
Basophils Relative: 0.8 % (ref 0.0–3.0)
Eosinophils Absolute: 0.2 10*3/uL (ref 0.0–0.7)
Eosinophils Relative: 2.8 % (ref 0.0–5.0)
HCT: 42.1 % (ref 36.0–46.0)
Hemoglobin: 14.1 g/dL (ref 12.0–15.0)
Lymphocytes Relative: 21.2 % (ref 12.0–46.0)
Lymphs Abs: 1.4 10*3/uL (ref 0.7–4.0)
MCHC: 33.4 g/dL (ref 30.0–36.0)
MCV: 91.3 fl (ref 78.0–100.0)
Monocytes Absolute: 0.9 10*3/uL (ref 0.1–1.0)
Monocytes Relative: 13.4 % — ABNORMAL HIGH (ref 3.0–12.0)
Neutro Abs: 4 10*3/uL (ref 1.4–7.7)
Neutrophils Relative %: 61.8 % (ref 43.0–77.0)
Platelets: 171 10*3/uL (ref 150.0–400.0)
RBC: 4.61 Mil/uL (ref 3.87–5.11)
RDW: 13.5 % (ref 11.5–14.6)
WBC: 6.5 10*3/uL (ref 4.5–10.5)

## 2013-01-21 LAB — HEPATIC FUNCTION PANEL
ALT: 18 U/L (ref 0–35)
Bilirubin, Direct: 0.2 mg/dL (ref 0.0–0.3)
Total Bilirubin: 0.9 mg/dL (ref 0.3–1.2)
Total Protein: 7 g/dL (ref 6.0–8.3)

## 2013-01-21 LAB — BASIC METABOLIC PANEL
BUN: 19 mg/dL (ref 6–23)
Calcium: 9 mg/dL (ref 8.4–10.5)
Chloride: 100 mEq/L (ref 96–112)
Creatinine, Ser: 1.4 mg/dL — ABNORMAL HIGH (ref 0.4–1.2)

## 2013-01-21 LAB — LIPID PANEL
Cholesterol: 138 mg/dL (ref 0–200)
HDL: 37.1 mg/dL — ABNORMAL LOW (ref >39.00)
LDL Cholesterol: 67 mg/dL (ref 0–99)
Total CHOL/HDL Ratio: 4
Triglycerides: 169 mg/dL — ABNORMAL HIGH (ref 0.0–149.0)

## 2013-01-21 LAB — HEMOGLOBIN A1C: Hgb A1c MFr Bld: 8.2 % — ABNORMAL HIGH (ref 4.6–6.5)

## 2013-01-21 MED ORDER — FUROSEMIDE 40 MG PO TABS: 40.0000 mg | ORAL_TABLET | ORAL | Status: DC

## 2013-01-21 NOTE — Progress Notes (Signed)
Subjective:    Patient ID: [name], female    DOB: Jan 22, 1911, 79 y.o.   MRN: [id number]  HPI 79 y/o WF here for an add-on visit due to a fall yest... she has mult medical problems as noted below...   ~  August 18, 2010:  she fell 2wks ago- hit occiput w/ hematoma on scalp> ER eval reviewed: lost balance; fell into bathtub; no LOC;  labs- OK, BS=180, +UTI given macrobid;  CT Brain= atrophy, chr microvasc dis, NAD;  CT CSpine= multilevel spondylosis & suboccipital hematoma in soft tissues w/o fx etc...  She had Ophthal f/u 3/11 DrRankin> senile macular degen OU, vitreous detach OS, bilat Drusen...  she received letter from [facility] DM & needs to be on ACE or ARB, therefore start Losartan 25mg /d...  ~  October 11, 2010:  add-on appt today after fall at home yest> she has been sl dizzy in the AMs & fell in the bathroom yest AM striking right occiput on commode- no blackout etc, didn't break skin, no hematoma but area bruised, min tender, neck OK, etc... she notes severe arthritis w/ pain & left knee gives way (right TKR in past)... she had recent infected callous tip of left 3rd hammer toe w/ drainage & antibiotic rx from DrSikora- much improved now (prob neuropathy & they are planning to get DM shoes)... BP appears well controlled on meds & no postural changes apparent, but sl higher in AMs & we discussed poss strategies w/ timing of pills etc... Plan- check CT Brain w/o contrast>   ~  February 14, 2011:  4 mo ROV- 79 y/o now & no further falls...    BP controlled on Aten, Losar, & Lasix> 110.54 today, no postural change & feeling well w/o CP, palpit, ch in SOB,syncope, etc;  CXR f/u today w/ Cardiomeg (no change), clear/ NAD;  Labs look good & BNP=211 (continue same meds);  she has AFib w/ rate control strategy> doing well on meds +ASA...    Chol remains stable on Prav40 + diet;  not fasting today> continue same...    DM controlled on her Metformin Bid + diet> BS 136, A1c 6.8; continue same  Rx...    Mild renal insuffic w/ BUN 27, creat 1.5 on meds +Lasix 40mg /d; continue same for now...    Other problems: GI stable; Vit D level= 50 & supplemented w/ 2000 u daily; no recent falls w/ her new DM shows from Podiatrist...  ~  July, 31, 2012:  74mo ROV & she was [facility] 5/12 by [facility] w/ ?Pneumonia LLL (no sputum, no cultures), CHF/ DD, AFib not a coumadin cand, UTI (urine +Klebs & Enterococcus, treated w/ Avelox), & deconditioned; disch to [facility] for rehab, now back home w/ difficulty & help including home PT (not much benefit per pt, but daugh thinks it's helping);  She had prob w/ bladder, required foley, had f/u Urology & cath has been removed; currently she is more mobile, hard to rise from chair, walks some, showers, states she's ok w/ ADLs;  She is c/o a dermatitis on leg today- looks like psoriasis, try Clobetasol cream (if no better she'll need Derm refer)...  They request refills today- OK >>     We are having the usual difficult reconciling her meds from prev office, then hosp disch, then NH disch ==> now new home meds >> she is asked to bring all med bottles for review!!!  ~  October 25, 2011:  3-37mo ROV & essentially  stable w/ mult minor complaints- eg.ears, heberden's nodes, urinary incont (doesn't want Urology or meds); Podiatry- DrSikora working on her toes; she is able to do her own ADLs- toilets, bathes, walks some    HBP> on Coreg3.125Bid, Losartan25, Lasix20, K20; BP= 124/80 & she denies HA, CP, palpit, ch in SOB or edema...    AFib & CHF> on ASA + above meds; rate control strategy, no Coumadin due to falls...    Chol> on Prav40 & due for FLP (she will ret for this)...    DM> on Glipiz2.5 & home BS checks ~100 she says; needs to ret for A1c...    GERD> on Prilosec20 & symptoms controlled...    Urology> followed by DrTannenbaum, emptying better & bladder function improved...    DJD> hx severe arthritis w/ right TKR & using vicodin 2-3/d as needed...    Gait Abn & Falls> on  Neurontin300-2Qhs, followed by Ortho & Podiatry...  ~  Apr 24, 2012:  39mo ROV & post hospital follow up> Adm 2/13 w/ UTI, weakness, AFib, DD> Enterococcus UTI treated w/ Levaquin, fluid balance carefully adjusted, Coreg increased (Losartan stopped), & disch to NH for rehab; back home now & ambulating w/ walker but still weak & needs home PT, exercises, etc...  We reviewed prob list, meds, xrays and labs> see below>> CXR 2/13 showed cardiomeg, clear lungs, osteopenia & TSpine degen changes... LABS 5/13:  FLP- at goals on Prav40 x TG=154;  Chems- ok x BS=123 A1c=6.5;  CBC= wnl;  TSH=1.34;  VitD= 56   ~  July 25, 2012:  31mo ROV & daughter notes that she is less mobile, walking less due to trouble w/ vision & arthritis; incr stress as the daugh with whom she lives has been Dx w/ MultMyeloma on chemo now & other daugh (here today) has been trying to care for both;  Pt able to bathe 7 dress self but difficulty w/ mobility- offered PT/ OT but daugh says she refused to coop w/ Rx in past...    BP controlled on meds;  Chol & DM have been under adeq control for this 79 y/o lady;  She uses Vicodin as needed for pain...     We reviewed prob list, meds, xrays and labs> see below for updates >>  ~  January 21, 2013:  39mo ROV & Ivon turned 89 last month- they have noted some incr edema in legs R>L & she is not restricting sodium adequately; weight is up 13# by our scales to 186#; they DID NOT bring meds to the OV & there was some confusion on her Lasix dose- but confirmed that she is taking 40mg /d & labs today showed Creat=1.4 and BNP much improved to 187 (from 1545); this doesn't give [place] much wiggle room w/ her edema & suggest> NO SALT, elev legs, wear support hose, and consider extra Lasix40 prn on the days that edema doesn't go down overnight...  We reviewed the following medical problems during today's office visit >>     HBP> on Coreg25-1/2Bid, Losartan50, Lasix40mg /d, K20; BP= 126/88 & she denies HA, CP,  palpit, ch in SOB...    AFib & CHF> on ASA325 + above meds; rate control strategy, no Coumadin due to falls; she has incr edema & wt as noted- but Cr=1.4 & BNP=187- therefore needs better no salt diet, elev legs, support hose & ok to take extra 1/2 lasix (for 60mg  total) if swelling doesn't go down overnight.    Chol> on Prav40 & FLP shows  TChol 138, TG 169, HDL37, LDL 67... Continue same.    DM> on GlipizER-5mg -1/2 daily & BS today=144, A1c=8.2 which is worse! Needs better low carb diet & incr GlipER to 5mg  Qam (watch BS at home).    GERD> on Prilosec20 & symptoms controlled...    Urology> followed by DrTannenbaum, emptying better & bladder function improved...    DJD> hx severe arthritis w/ right TKR & using Vicodin 2-3/d as needed; family notes less mobile but still up w/ walker daily.    Osteopenia> on OTC calcium, MVI, VitD2000u/d...    Gait Abn & Falls> on Neurontin300-2Qhs, followed by Ortho & Podiatry... We reviewed prob list, meds, xrays and labs> see below for updates >> she had Flu vaccine 9/13, Tetanus in 2011, and last Pneumovax in 2008... CXR 2/14 showed stable heart size, clear lungs, osteopenia in bones, NAD... LABS 2/14:  FLP- at goals on Prav40 x TG=169;  Chems- ok x BS=144 A1c=8.2 Creat=1.4 BNP=187;  CBC- wnl;  TSH=2/03...          Problem List:  MACULAR DEGENERATION (ICD-362.50) - on OCUVITES daily... s/p laser surg by DrRankin... she cannot see well enough to read... they tried Bilberry Juice as heard on the [facility]- it's helping... ~  3/10: had f/u w/ DrRankin- stabilized... nothing else he can do... f/u 21yr. ~  3/11:  had f/u DrRankin> senile macular degen OU (worsening), vitreous detach OS (stable), bilat Drusen...    PULMONARY NODULE (ICD-518.89) - RUL nodule, eval 2007 w/ bx = granuloma...  ~  Serial CXRs have been stable... ~  CXR 3/11 showed chr changes & calcif granulomas, prob calcif of LAD seen on lat. ~  CXR 5/12 w/ Hosp for LLL infiltrate, improved  serially w/ antibiotic rx. ~  CXR 2/13 showed cardiomeg, clear lungs, osteopenia & TSpine degen changes... ~  CXR 2/14 showed stable heart size, clear lungs, osteopenia in bones, NAD...  HYPERTENSION (ICD-401.9) - she takes ASA 325mg /d, COREG25mg - 1/2Bid, LOSARTAN50mg /d, & LASIX40mg /d, K20 added 1/2 per day... ~  9/11:  she received letter from [facility] stating she should be on ACE or ARB Rx> add Losartan 25mg /d... ~  5/12:  Hosp by [facility] w/ LLL pneumonia, UTI, CHF/ DD & meds were changed/ adjusted> Losartan & Atenolol stopped, Coreg added, Lasix decreased KCl added. ~  7/12:  1st post hosp (5/12) visit & meds reviewed & reconciled ~  11/12: On Coreg3.125Bid, Losartan25, Lasix20, K20; BP= 124/80 & she denies HA, CP, palpit, ch in SOB or edema... ~  5/13: On Coreg12.5Bid, Lasix20, K10; BP= 140/98 & wil will restart he LOSARTAN50... ~  8/13:  On Coreg12,5Bid, Losartan50, Lasix40, K20-1/2> BP= 122/72 & she denies CP, palpit, ch in SOB or edema... ~  2/14: on Coreg25-1/2Bid, Losartan50, Lasix40mg /d, K20; BP= 126/88 & she denies HA, CP, palpit, ch in SOB...  CONGESTIVE HEART FAILURE (ICD-428.0) - on above meds... ~  2DEcho 6/06 showed biatrial enlargement, mild MR/TR, norm LVF... ~  labs 3/11 showed BNP= 189... ~  Labs from 5/12 hosp reviewed in [place]... ~  Labs from 2/13 hosp reviewed in Epic... ~  Labs 2/14 showed BNP=187, and Creat=1.4...  ATRIAL FIBRILLATION, CHRONIC (ICD-427.31) - prev on Coumadin, pt & family decided to stop Coumadin in 2007, & now on ASA daily... rate control strategy w/ COREG currently...  VENOUS INSUFFICIENCY (ICD-459.81) - on low sodium diet, and Lasix... ~  2/14: wt up 13# to 186# assoc w/ incr LE edema R>L; we reviewed need for salt restriction, elev legs,  wear support hose, continue Lasix40 7 ok to take extra 20mg  if swelling doesn't go down overnight...  HYPERCHOLESTEROLEMIA (ICD-272.0) - on PRAVACHOL 40mg Qhs and tol well... ~  FLP 11/08 showed TChol 151, TG 152, HDL  38, LDL 82... > continue med & diet efforts. ~  FLP 3/09 shows TChol 206, TG 332, HDL 40, LDL 97... > cont same, get wt down! ~  FLP 3/10 showed TChol 172, TG 214, HDL 43, LDL 92... > improved- continue same. ~  FLP 3/11 showed TChol 167, TG 236, HDL 46, LDL 83 ~  FLP 9/11 on Prav40 showed TChol 144, TG 212, HDL 36, LDL 72... reminded of low fat diet rec... ~  It is difficult to get her in for FASTING blood work... ~  FLP 5/13 on Prav40 showed TChol 149, TG 154, HDL 43, LDL 75 ~  FLP 2/14 on Prav40 showed TChol 138, TG 169, HDL37, LDL 67   DM (ICD-250.00) - prev on Metform500bid, stopped 5/12 Hosp & ch to GLIPIZIDE XL 2.5mg  Qam... ~  labs 11/08 showed FBS=182, HgA1c=7.1... >continue attempts at diet control. ~  labs 3/09 showed  FBS=178, HgA1c=7.2... >she will need to start meds: Metformin ER 500mg Qam. ~  labs 7/09 showed BS= 138, HgA1c= 6.7.Marland KitchenMarland Kitchenrec- keep same.  ~  labs 3/10 (wt=162#) showed 139, HgA1c= 6.6... > continue same meds. ~  labs 9/10 (wt=175#) showed BS= 158, A1c= 6.8.Marland KitchenMarland Kitchen needs better diet. ~  labs 3/11 (wt=179#) showed BS= 155, A1c= 7.6.Marland KitchenMarland Kitchen may need incr meds! get wt down. ~  labs 9/11 (wt=173#) showed BS= 166, A1c= 8.0.Marland KitchenMarland Kitchen worsening A1c- rec incr Metform to 2/d... ~  Urine microalb 9/11 = neg... ~  Labs 5/12 Hosp reviewed in EChart> off Metform, on Glipizide 2.5mg /d... ~  Labs 7/12 in office on Glip2.5 showed BS= 101, A1c= 6.7.Marland KitchenMarland Kitchen rec to continue same. ~  11/12:  daugh states BS at home all ~100 on the Glip2.5 daily; needs to ret for labs & A1c. ~  Labs 5/13 showed BS= 123, A1c= 6.5.Marland KitchenMarland Kitchen Continue same Rx. ~  She reports Podiatric care from DrSikora- hammertoes, trims callouses Q75mo... ~  2/14: on GlipizER-5mg -1/2 daily & BS today=144, A1c=8.2 which is worse! Needs better low carb diet & incr GlipER to 5mg  Qam (watch BS at home).  GERD (ICD-530.81) - on PRILOSEC 20mg /d, doing well without heartburn, n/v, etc... Hx HH, GERD, and prev DU... last EGD was 7/02 showing HH, reflux, stricture,  gastric ulcer... she has refused colonoscopies in the past...  UTI'S, HX OF (ICD-V13.00) - hx UTI's, s/p hysterectomy, prev bladder surg... ~  8/11:  ER visit after fall w/ UTI Rx'd w/ Macrobid... ~  Urinary incont/ bladder prob followed by DrTannenbaum...  DEGENERATIVE JOINT DISEASE (ICD-715.90)  - severe arthritis w/ prev right TKR 1990... followed by DrGioffre, DrBednarz, DrSikora (hammer toes)- not willing to consider more surg... uses Vicodin 2-3 per day as needed, shots not helpful in the past... leg discomfort may be neuropathy (but monofilament test is normal)- on NEURONTIN Rx taking 300mg - 2Qhs, & VICODIN as needed... ~  she ambulates w/ walker, she has had phys therapy & does chair exercises, she has knee braces, etc...  VITAMIN D DEFICIENCY (ICD-268.9) - not currently taking any supplement. ~  labs 7/09 showed Vit D level = 11... rec> start Vit D 50000 u weekly (but she stopped after 58mo). ~  labs 3/10 showed Vit D level = 30... rec> change to 2000 u daily. ~  Labs 3/12 showed Vit D level = 50 ~  Labs 5/13 showed Vit D level = 56  ABNORMALITY OF GAIT (ICD-781.2) & Frequent FALLS>  ~  9/11:  she fell 2wks ago- hit occiput w/ hematoma on scalp> ER eval reviewed: lost balance; fell into bathtub; no LOC;  labs- OK, BS=180, +UTI given macrobid;  CT Brain= atrophy, chr microvasc dis, NAD;  CT CSpine= multilevel spondylosis & suboccipital hematoma in soft tissues w/o fx etc...   ~  11/11:  she fell yest- hit right occiput on commode w/ bruise but no hematoma; didn't go to the ER; no focal neuro deficits & we discussed checking CT Brain to be sure no subdural etc... ~  11/12:  She continues weak, gait abn, but manages her own ADLs ok...  ANXIETY (ICD-300.00)  DERMATITIS - uses generic LIDEX E cream as needed...  Health Maintenance:  she gets the yearly Flu vaccine each Autumn;  and she had additional PNEUMOVAX ~2008 at [facility], she says.   Past Surgical History  Procedure Laterality  Date  . Cholecystectomy  6/07    Dr. Corliss Skains  . Total abdominal hysterectomy    . Bladder surgery    . Total knee arthroplasty  1990    right - Dr. Darrelyn Hillock    Outpatient Encounter Prescriptions as of 01/21/2013  Medication Sig Dispense Refill  . acetaminophen (TYLENOL) 325 MG tablet as needed - alternates this with hydrocodone       . aspirin 325 MG tablet Take 1 tablet (325 mg total) by mouth daily.      . carvedilol (COREG) 25 MG tablet TAKE (1/2) TABLET TWICE DAILY.  60 tablet  5  . Cholecalciferol (VITAMIN D) 2000 UNITS CAPS Take 2,000 Units by mouth daily. 1 capsule by mouth once daily      . furosemide (LASIX) 40 MG tablet Take 1 tablet (40 mg total) by mouth daily.  30 tablet  11  . gabapentin (NEURONTIN) 300 MG capsule Take 2 capsules (600 mg total) by mouth at bedtime.  60 capsule  11  . glipiZIDE (GLUCOTROL XL) 5 MG 24 hr tablet Take 1/2 tablet by mouth daily  15 tablet  11  . K-DUR 20 MEQ tablet TAKE 1 TABLET DAILY.  30 tablet  6  . losartan (COZAAR) 50 MG tablet Take 1 tablet (50 mg total) by mouth daily.  30 tablet  11  . omeprazole (PRILOSEC) 20 MG capsule Take 20 mg by mouth daily. Take 1 capsule by mouth 30 minutes before first meal of the day      . pravastatin (PRAVACHOL) 40 MG tablet Take 1 tablet (40 mg total) by mouth daily.  30 tablet  11   No facility-administered encounter medications on file as of 01/21/2013.    Allergies  Allergen Reactions  . Celecoxib     GI discomfort  . Diclofenac Sodium     REACTION: causing burining sensation after use    Current Medications, Allergies, Past Medical History, Past Surgical History, Family History, and Social History were reviewed in [employer] record.    Review of Systems    See HPI - all other systems neg except as noted...  The patient complains of vision loss, decreased hearing, dyspnea on exertion, muscle weakness, and difficulty walking.  The patient denies anorexia, fever, weight loss,  weight gain, hoarseness, chest pain, syncope, peripheral edema, prolonged cough, headaches, hemoptysis, abdominal pain, melena, hematochezia, severe indigestion/heartburn, hematuria, incontinence, suspicious skin lesions, transient blindness, depression, unusual weight change, abnormal bleeding, enlarged lymph nodes, and angioedema.  Objective:   Physical Exam     WD, WN, 79 y/o WF chr ill appearing but in NAD... GENERAL:  Alert & oriented; pleasant & cooperative... sm bruise right occiput, no hematoma. HEENT:  [place]/AT, EOM-wnl, poor vision from macular degen; EACs-clear, TMs-wnl, NOSE-clear, THROAT-clear w/ dry MM's. NECK:  Supple w/ fairROM; no JVD; normal carotid impulses w/o bruits; no thyromegaly or nodules palpated; no lymphadenopathy. CHEST:  Clear to P & A; without wheezes/ rales/ or rhonchi. HEART:  irreg, gr 1/6 SEM without rubs or gallops detected... ABDOMEN:  Soft & nontender; normal bowel sounds; no organomegaly or masses detected. EXT:  severe arthritic changes & rightTKR; no varicose veins/ +venous insuffic/ tr edema. NEURO:  CN's intact; +gait abn, no focal neuro deficits x sl decr sensation in LE's... DERM:  No lesions noted; no rash etc...  RADIOLOGY DATA:  Reviewed in the EPIC EMR & discussed w/ the patient...  LABORATORY DATA:  Reviewed in the EPIC EMR & discussed w/ the patient...   Assessment & Plan:    HBP/ CHF (DD)/ AFib>  Asked again to bring all bottles to office for review==> we decided to continue Lasix40 and stress no salt, elev legs, support hose & allow an extra 20mg  lasix on the days when swelling doesn't go down overnight... NOTE> Creat=1.4 & BNP=187 on Lasix 40mg /d...  CHOL>  Continue Prav40 Qhs and take regularly, Chol numbers look good...  DM>  What happened- BS=144, A1c=8.2 on the GlipER5- taking 1/2 tab daily; we will incr to 5mg /d & follow...  GERD>  Stable on PPI rx daily...  UTIs>  Treated during the 5/12 & 2/13 hosp & NH stay; she had prob w/  bladder & followed by DrTannenbaum...  DJD/ Gait Abn/ etc>  She finished home therapy & drifted back into her old habits; encouraged to incr exercise, etc...  Dermatitis>  Try the Clobetasol cream...  Other medical problems as noted....   Patient's Medications  New Prescriptions   No medications on file  Previous Medications   ACETAMINOPHEN (TYLENOL) 325 MG TABLET    as needed - alternates this with hydrocodone    ASPIRIN 325 MG TABLET    Take 1 tablet (325 mg total) by mouth daily.   CARVEDILOL (COREG) 25 MG TABLET    TAKE (1/2) TABLET TWICE DAILY.   CHOLECALCIFEROL (VITAMIN D) 2000 UNITS CAPS    Take 2,000 Units by mouth daily. 1 capsule by mouth once daily   CLOBETASOL PROP CREA-COAL TAR 0.05 & 2.3 % KIT    Apply as directed   GABAPENTIN (NEURONTIN) 300 MG CAPSULE    Take 2 capsules (600 mg total) by mouth at bedtime.   GLIPIZIDE (GLUCOTROL XL) 5 MG 24 HR TABLET    Take 1 tablet by mouth daily   HYDROCODONE-ACETAMINOPHEN (NORCO/VICODIN) 5-325 MG PER TABLET    Take 1 tablet by mouth 3 (three) times daily as needed for pain.   K-DUR 20 MEQ TABLET    TAKE 1 TABLET DAILY.   LOSARTAN (COZAAR) 50 MG TABLET    Take 1 tablet (50 mg total) by mouth daily.   OMEPRAZOLE (PRILOSEC) 20 MG CAPSULE    Take 20 mg by mouth daily. Take 1 capsule by mouth 30 minutes before first meal of the day   PRAVASTATIN (PRAVACHOL) 40 MG TABLET    Take 1 tablet (40 mg total) by mouth daily.  Modified Medications   Modified Medication Previous Medication   FUROSEMIDE (LASIX) 40 MG TABLET furosemide (LASIX) 40 MG tablet  Take 1 tablet (40 mg total) by mouth as directed.    Take 1 tablet (40 mg total) by mouth daily.  Discontinued Medications   No medications on file

## 2013-01-21 NOTE — Telephone Encounter (Signed)
Spoke with Gigi Gin and notified of recs per SN She verbalized understanding and states that no questions Rx was sent to pharm and appt with SN set for 04/22/13

## 2013-01-21 NOTE — Telephone Encounter (Signed)
Spoke with patients daughter [name], she states patient was seen today and Dr. Kriste Basque advised to increase patients lasix to 40mg .  However per [name], patient had already been taking 40mg  for "a very long time."  [name] would like to know if Dr. Kriste Basque wants to remain at 40mg  or increase it some more.  Dr. Kriste Basque please advise.  OV Note from 01/21/13 (today) : Patient Instructions    Today we updated your med list in our EPIC system...  Continue your current medications the same...  We decided to increase the Lasix to 40mg - one tab each AM...  And remember to eliminate salt/sodium, elevate legs, wear support hose when able...  Today we did your follow up CXR & FASTING blood work...  We will contact you w/ the results when avail...  Try the OTC ear wax kit to loosen her died cerumen...  Call for any questions...  Let's plan a follow up visit in 6 months, sooner if needed for problems

## 2013-01-21 NOTE — Telephone Encounter (Signed)
Per SN---  She will need rov  In 3 months to recheck since we are increasing her lasix.  She will need to bring in all medication bottles to this visit.  We will alternate her dose of lasix with 40 mg , 80 mg, 40 mg, 80 mg, etc.  Ok to scheduled her appt in 3 months with SN.   thanks

## 2013-01-21 NOTE — Patient Instructions (Addendum)
Today we updated your med list in our EPIC system...    Continue your current medications the same...  We decided to increase the Lasix to 40mg - one tab each AM...    And remember to eliminate salt/sodium, elevate legs, wear support hose when able...  Today we did your follow up CXR & FASTING blood work...    We will contact you w/ the results when avail...  Try the OTC ear wax kit to loosen her died cerumen...  Call for any questions...  Let's plan a follow up visit in 6 months, sooner if needed for problems.Marland KitchenMarland Kitchen

## 2013-01-22 MED ORDER — GLIPIZIDE ER 5 MG PO TB24: 5.0000 mg | ORAL_TABLET | Freq: Every day | ORAL | Status: DC

## 2013-01-22 NOTE — Addendum Note (Signed)
Addended by: Marcellus Scott on: 01/22/2013 04:28 PM   Modules accepted: Orders

## 2013-01-24 ENCOUNTER — Other Ambulatory Visit: Payer: Self-pay | Admitting: Pulmonary Disease

## 2013-01-24 NOTE — Telephone Encounter (Signed)
Pt seen 2.17.14 by SN Refill on glipizide 5mg  sent with 2 sets of instructions Pharmacy requesting clarification > is pt to take 1/2 tabs or 1 whole tab daily????  Per last ov note: DM> What happened- BS=144, A1c=8.2 on the GlipER5- taking 1/2 tab daily; we will incr to 5mg /d & follow...  Clarification written on fax and sent back to pharmacy Med list updated

## 2013-02-04 ENCOUNTER — Telehealth: Payer: Self-pay | Admitting: Pulmonary Disease

## 2013-02-04 MED ORDER — CLOBETASOL PROPIONATE 0.05 % EX CREA: TOPICAL_CREAM | Freq: Two times a day (BID) | CUTANEOUS | Status: DC

## 2013-02-04 NOTE — Telephone Encounter (Signed)
Clobetasol cream has been sent in to the pharmacy and i called and spoke with wilma and she is aware. Nothing further is needed.

## 2013-02-27 ENCOUNTER — Other Ambulatory Visit: Payer: Self-pay | Admitting: Pulmonary Disease

## 2013-02-27 MED ORDER — HYDROCODONE-ACETAMINOPHEN 5-325 MG PO TABS: ORAL_TABLET | ORAL | Status: DC

## 2013-04-09 ENCOUNTER — Other Ambulatory Visit: Payer: Self-pay | Admitting: Pulmonary Disease

## 2013-04-22 ENCOUNTER — Ambulatory Visit (INDEPENDENT_AMBULATORY_CARE_PROVIDER_SITE_OTHER): Payer: Medicare Other | Admitting: Pulmonary Disease

## 2013-04-22 ENCOUNTER — Encounter: Payer: Self-pay | Admitting: Pulmonary Disease

## 2013-04-22 ENCOUNTER — Other Ambulatory Visit (INDEPENDENT_AMBULATORY_CARE_PROVIDER_SITE_OTHER): Payer: Medicare Other

## 2013-04-22 VITALS — BP 138/82 | HR 69 | Temp 97.1°F | Ht 63.0 in | Wt 185.2 lb

## 2013-04-22 DIAGNOSIS — R413 Other amnesia: Secondary | ICD-10-CM

## 2013-04-22 DIAGNOSIS — R531 Weakness: Secondary | ICD-10-CM

## 2013-04-22 DIAGNOSIS — E7800 Pure hypercholesterolemia, unspecified: Secondary | ICD-10-CM

## 2013-04-22 DIAGNOSIS — K219 Gastro-esophageal reflux disease without esophagitis: Secondary | ICD-10-CM

## 2013-04-22 DIAGNOSIS — I4891 Unspecified atrial fibrillation: Secondary | ICD-10-CM

## 2013-04-22 DIAGNOSIS — I10 Essential (primary) hypertension: Secondary | ICD-10-CM

## 2013-04-22 DIAGNOSIS — E119 Type 2 diabetes mellitus without complications: Secondary | ICD-10-CM

## 2013-04-22 DIAGNOSIS — I509 Heart failure, unspecified: Secondary | ICD-10-CM

## 2013-04-22 DIAGNOSIS — I872 Venous insufficiency (chronic) (peripheral): Secondary | ICD-10-CM

## 2013-04-22 DIAGNOSIS — R609 Edema, unspecified: Secondary | ICD-10-CM | POA: Insufficient documentation

## 2013-04-22 DIAGNOSIS — M1990 Unspecified osteoarthritis, unspecified site: Secondary | ICD-10-CM

## 2013-04-22 LAB — BASIC METABOLIC PANEL
BUN: 30 mg/dL — ABNORMAL HIGH (ref 6–23)
CO2: 31 mEq/L (ref 19–32)
Calcium: 9.4 mg/dL (ref 8.4–10.5)
Creatinine, Ser: 1.6 mg/dL — ABNORMAL HIGH (ref 0.4–1.2)
Glucose, Bld: 236 mg/dL — ABNORMAL HIGH (ref 70–99)

## 2013-04-22 MED ORDER — DONEPEZIL HCL 5 MG PO TABS: 5.0000 mg | ORAL_TABLET | Freq: Every evening | ORAL | Status: DC | PRN

## 2013-04-22 NOTE — Patient Instructions (Addendum)
Today we updated your med list in our EPIC system...    Continue your current medications the same...  Keep the Lasix fluid pill at one tab one day (40mg ) and 1.5tabs the next (60mg )... We also wrote for a new medication- Aricept (Donepizil) 5mg  daily for memory...  Today we did your follow up blood chemistries...    We will contact you w/ the results when available...   Please let us know if you would like [place] to contact [facility] etc regarding any additional help at home...   Let's plan a follow up visit in 4-68mo, sooner if needed for problems.Marland KitchenMarland Kitchen

## 2013-04-22 NOTE — Progress Notes (Signed)
Subjective:    Patient ID: [name], female    DOB: [birth year], 79 y.o.   MRN: [id number]  HPI 79 y/o WF here for an add-on visit due to a fall yest... she has mult medical problems as noted below...   ~  Apr 24, 2012:  76mo ROV & post hospital follow up> Adm 2/13 w/ UTI, weakness, AFib, DD> Enterococcus UTI treated w/ Levaquin, fluid balance carefully adjusted, Coreg increased (Losartan stopped), & disch to NH for rehab; back home now & ambulating w/ walker but still weak & needs home PT, exercises, etc...  We reviewed prob list, meds, xrays and labs> see below>> CXR 2/13 showed cardiomeg, clear lungs, osteopenia & TSpine degen changes... LABS 5/13:  FLP- at goals on Prav40 x TG=154;  Chems- ok x BS=123 A1c=6.5;  CBC= wnl;  TSH=1.34;  VitD= 56   ~  July 25, 2012:  61mo ROV & daughter notes that she is less mobile, walking less due to trouble w/ vision & arthritis; incr stress as the daugh with whom she lives has been Dx w/ MultMyeloma on chemo now & other daugh (here today) has been trying to care for both;  Pt able to bathe 7 dress self but difficulty w/ mobility- offered PT/ OT but daugh says she refused to coop w/ Rx in past...    BP controlled on meds;  Chol & DM have been under adeq control for this 79 y/o lady;  She uses Vicodin as needed for pain...     We reviewed prob list, meds, xrays and labs> see below for updates >>  ~  January 21, 2013:  76mo ROV & [name] turned 89 last month- they have noted some incr edema in legs R>L & she is not restricting sodium adequately; weight is up 13# by our scales to 186#; they DID NOT bring meds to the OV & there was some confusion on her Lasix dose- but confirmed that she is taking 40mg /d & labs today showed Creat=1.4 and BNP much improved to 187 (from 1545); this doesn't give [place] much wiggle room w/ her edema & suggest> NO SALT, elev legs, wear support hose, and consider extra Lasix40 prn on the days that edema doesn't go down overnight...  We reviewed  the following medical problems during today's office visit >>     HBP> on Coreg25-1/2Bid, Losartan50, Lasix40mg /d, K20; BP= 126/88 & she denies HA, CP, palpit, ch in SOB...    AFib & CHF> on ASA325 + above meds; rate control strategy, no Coumadin due to falls; she has incr edema & wt as noted- but Cr=1.4 & BNP=187- therefore needs better no salt diet, elev legs, support hose & ok to take extra 1/2 lasix (for 60mg  total) if swelling doesn't go down overnight.    Chol> on Prav40 & FLP shows TChol 138, TG 169, HDL37, LDL 67... Continue same.    DM> on GlipizER-5mg -1/2 daily & BS today=144, A1c=8.2 which is worse! Needs better low carb diet & incr GlipER to 5mg  Qam (watch BS at home).    GERD> on Prilosec20 & symptoms controlled...    Urology> followed by DrTannenbaum, emptying better & bladder function improved...    DJD> hx severe arthritis w/ right TKR & using Vicodin 2-3/d as needed; family notes less mobile but still up w/ walker daily.    Osteopenia> on OTC calcium, MVI, VitD2000u/d...    Gait Abn & Falls> on Neurontin300-2Qhs, followed by Ortho & Podiatry... We reviewed prob list, meds, xrays  and labs> see below for updates >> she had Flu vaccine 9/13, Tetanus in 2011, and last Pneumovax in 2008... CXR 2/14 showed stable heart size, clear lungs, osteopenia in bones, NAD... LABS 2/14:  FLP- at goals on Prav40 x TG=169;  Chems- ok x BS=144 A1c=8.2 Creat=1.4 BNP=187;  CBC- wnl;  TSH=2/03...  ~  Apr 22, 2013:  32mo ROV & [name] has a new great grandchild;  daugh notes that her memory is worse, having more difficulties w/ ADLs/ showers, etc- They want visiting nurses, home help, etc & we will order home assessment...     HBP> on Coreg25-1/2Bid, Losartan50, Lasix40 alt w/ 60 qod, K20-1/2; BP= 138/82 & she denies HA, CP, palpit, ch in SOB...    AFib & CHF> on ASA325 + above meds; rate control strategy, no Coumadin due to falls; she has incr edema & wt stable 185# but Cr=1.4 & BNP=187- therefore needs better  no salt diet, elev legs, support hose & ok to take extra 1/2 lasix (for 60mg  total) if swelling doesn't go down overnight=> they've been taking 40-60 qod...    Chol> on Prav40 & FLP 2/14 showed TChol 138, TG 169, HDL 37, LDL 67... Continue same.    DM> on GlipizER-5mg /d & BS today=236, A1c=8.4 which is worse! Needs better low carb diet & incr GlipER to 5mg Bid (watch BS at home). We reviewed prob list, meds, xrays and labs> see below for updates >>  LABS 5/14:  Chems- BS=236, A1c=8.4, BUN=30, Cr=1.6.Marland KitchenMarland Kitchen          Problem List:  MACULAR DEGENERATION (ICD-362.50) - on OCUVITES daily... s/p laser surg by DrRankin... she cannot see well enough to read... they tried Bilberry Juice as heard on the [facility] [employer]- it's helping... ~  3/10: had f/u w/ DrRankin- stabilized... nothing else he can do... f/u 23yr. ~  3/11:  had f/u DrRankin> senile macular degen OU (worsening), vitreous detach OS (stable), bilat Drusen...    PULMONARY NODULE (ICD-518.89) - RUL nodule, eval 2007 w/ bx = granuloma...  ~  Serial CXRs have been stable... ~  CXR 3/11 showed chr changes & calcif granulomas, prob calcif of LAD seen on lat. ~  CXR 5/12 w/ Hosp for LLL infiltrate, improved serially w/ antibiotic rx. ~  CXR 2/13 showed cardiomeg, clear lungs, osteopenia & TSpine degen changes... ~  CXR 2/14 showed stable heart size, clear lungs, osteopenia in bones, NAD...  HYPERTENSION (ICD-401.9) - she takes ASA 325mg /d, COREG25mg - 1/2Bid, LOSARTAN50mg /d, & LASIX40mg /d, K20 added 1/2 per day... ~  9/11:  she received letter from [facility] stating she should be on ACE or ARB Rx> add Losartan 25mg /d... ~  5/12:  Hosp by [facility] w/ LLL pneumonia, UTI, CHF/ DD & meds were changed/ adjusted> Losartan & Atenolol stopped, Coreg added, Lasix decreased KCl added. ~  7/12:  1st post hosp (5/12) visit & meds reviewed & reconciled ~  11/12: On Coreg3.125Bid, Losartan25, Lasix20, K20; BP= 124/80 & she denies HA, CP, palpit, ch in SOB or edema... ~   5/13: On Coreg12.5Bid, Lasix20, K10; BP= 140/98 & wil will restart he LOSARTAN50... ~  8/13:  On Coreg12,5Bid, Losartan50, Lasix40, K20-1/2> BP= 122/72 & she denies CP, palpit, ch in SOB or edema... ~  2/14: on Coreg25-1/2Bid, Losartan50, Lasix40mg /d, K20; BP= 126/88 & she denies HA, CP, palpit, ch in SOB... ~  5/14: on Coreg25-1/2Bid, Losartan50, Lasix40 alt w/ 60 qod, K20-1/2; BP= 138/82 & she denies direct BP related symptoms...  CONGESTIVE HEART FAILURE (ICD-428.0) - on above  meds... ~  2DEcho 6/06 showed biatrial enlargement, mild MR/TR, norm LVF... ~  labs 3/11 showed BNP= 189... ~  Labs from 5/12 hosp reviewed in [place]... ~  Labs from 2/13 hosp reviewed in Epic... ~  Labs 2/14 showed BNP=187, and Creat=1.4...  ATRIAL FIBRILLATION, CHRONIC (ICD-427.31) - prev on Coumadin, pt & family decided to stop Coumadin in 2007, & now on ASA daily... rate control strategy w/ COREG currently...  VENOUS INSUFFICIENCY (ICD-459.81) - on low sodium diet, and Lasix... ~  2/14: wt up 13# to 186# assoc w/ incr LE edema R>L; we reviewed need for salt restriction, elev legs, wear support hose, continue Lasix40 7 ok to take extra 20mg  if swelling doesn't go down overnight...  HYPERCHOLESTEROLEMIA (ICD-272.0) - on PRAVACHOL 40mg Qhs and tol well... ~  FLP 11/08 showed TChol 151, TG 152, HDL 38, LDL 82... > continue med & diet efforts. ~  FLP 3/09 shows TChol 206, TG 332, HDL 40, LDL 97... > cont same, get wt down! ~  FLP 3/10 showed TChol 172, TG 214, HDL 43, LDL 92... > improved- continue same. ~  FLP 3/11 showed TChol 167, TG 236, HDL 46, LDL 83 ~  FLP 9/11 on Prav40 showed TChol 144, TG 212, HDL 36, LDL 72... reminded of low fat diet rec... ~  It is difficult to get her in for FASTING blood work... ~  FLP 5/13 on Prav40 showed TChol 149, TG 154, HDL 43, LDL 75 ~  FLP 2/14 on Prav40 showed TChol 138, TG 169, HDL37, LDL 67   DM (ICD-250.00) - prev on Metform500bid, stopped 5/12 Hosp & ch to GLIPIZIDE XL  2.5mg  Qam... ~  labs 11/08 showed FBS=182, HgA1c=7.1... >continue attempts at diet control. ~  labs 3/09 showed  FBS=178, HgA1c=7.2... >she will need to start meds: Metformin ER 500mg Qam. ~  labs 7/09 showed BS= 138, HgA1c= 6.7.Marland KitchenMarland Kitchenrec- keep same.  ~  labs 3/10 (wt=162#) showed 139, HgA1c= 6.6... > continue same meds. ~  labs 9/10 (wt=175#) showed BS= 158, A1c= 6.8.Marland KitchenMarland Kitchen needs better diet. ~  labs 3/11 (wt=179#) showed BS= 155, A1c= 7.6.Marland KitchenMarland Kitchen may need incr meds! get wt down. ~  labs 9/11 (wt=173#) showed BS= 166, A1c= 8.0.Marland KitchenMarland Kitchen worsening A1c- rec incr Metform to 2/d... ~  Urine microalb 9/11 = neg... ~  Labs 5/12 Hosp reviewed in EChart> off Metform, on Glipizide 2.5mg /d... ~  Labs 7/12 in office on Glip2.5 showed BS= 101, A1c= 6.7.Marland KitchenMarland Kitchen rec to continue same. ~  11/12:  daugh states BS at home all ~100 on the Glip2.5 daily; needs to ret for labs & A1c. ~  Labs 5/13 showed BS= 123, A1c= 6.5.Marland KitchenMarland Kitchen Continue same Rx. ~  She reports Podiatric care from DrSikora- hammertoes, trims callouses Q13mo... ~  2/14: on GlipizER-5mg -1/2 daily & BS today=144, A1c=8.2 which is worse! Needs better low carb diet & incr GlipER to 5mg  Qam (watch BS at home). ~  5/14: on GlipizER-5mg /d & BS today=236, A1c=8.4 which is worse! Needs better low carb diet & incr GlipER to 5mg Bid (watch BS at home).  GERD (ICD-530.81) - on PRILOSEC 20mg /d, doing well without heartburn, n/v, etc... Hx HH, GERD, and prev DU... last EGD was 7/02 showing HH, reflux, stricture, gastric ulcer... she has refused colonoscopies in the past...  UTI'S, HX OF (ICD-V13.00) - hx UTI's, s/p hysterectomy, prev bladder surg... ~  8/11:  ER visit after fall w/ UTI Rx'd w/ Macrobid... ~  Urinary incont/ bladder prob followed by DrTannenbaum...  DEGENERATIVE JOINT DISEASE (ICD-715.90)  -  severe arthritis w/ prev right TKR 1990... followed by DrGioffre, DrBednarz, DrSikora (hammer toes)- not willing to consider more surg... uses Vicodin 2-3 per day as needed, shots not  helpful in the past... leg discomfort may be neuropathy (but monofilament test is normal)- on NEURONTIN Rx taking 300mg - 2Qhs, & VICODIN as needed... ~  she ambulates w/ walker, she has had phys therapy & does chair exercises, she has knee braces, etc...  VITAMIN D DEFICIENCY (ICD-268.9) - not currently taking any supplement. ~  labs 7/09 showed Vit D level = 11... rec> start Vit D 50000 u weekly (but she stopped after 61mo). ~  labs 3/10 showed Vit D level = 30... rec> change to 2000 u daily. ~  Labs 3/12 showed Vit D level = 50 ~  Labs 5/13 showed Vit D level = 56  ABNORMALITY OF GAIT (ICD-781.2) & Frequent FALLS>  ~  9/11:  she fell 2wks ago- hit occiput w/ hematoma on scalp> ER eval reviewed: lost balance; fell into bathtub; no LOC;  labs- OK, BS=180, +UTI given macrobid;  CT Brain= atrophy, chr microvasc dis, NAD;  CT CSpine= multilevel spondylosis & suboccipital hematoma in soft tissues w/o fx etc...   ~  11/11:  she fell yest- hit right occiput on commode w/ bruise but no hematoma; didn't go to the ER; no focal neuro deficits & we discussed checking CT Brain to be sure no subdural etc... ~  11/12:  She continues weak, gait abn, but manages her own ADLs ok...  ANXIETY (ICD-300.00)  DERMATITIS - uses generic LIDEX E cream as needed...  Health Maintenance:  she gets the yearly Flu vaccine each Autumn;  and she had additional PNEUMOVAX ~2008 at [facility], she says.   Past Surgical History  Procedure Laterality Date  . Cholecystectomy  6/07    Dr. Corliss Skains  . Total abdominal hysterectomy    . Bladder surgery    . Total knee arthroplasty  1990    right - Dr. Darrelyn Hillock    Outpatient Encounter Prescriptions as of 04/22/2013  Medication Sig Dispense Refill  . acetaminophen (TYLENOL) 325 MG tablet as needed - alternates this with hydrocodone       . aspirin 325 MG tablet Take 325 mg by mouth daily.      . carvedilol (COREG) 25 MG tablet TAKE (1/2) TABLET TWICE DAILY.  60 tablet  5  .  Cholecalciferol (VITAMIN D) 2000 UNITS CAPS Take 2,000 Units by mouth daily. 1 capsule by mouth once daily      . clobetasol cream (TEMOVATE) 0.05 % Apply topically 2 (two) times daily.  30 g  5  . Clobetasol Prop Crea-Coal Tar 0.05 & 2.3 % KIT Apply as directed      . furosemide (LASIX) 40 MG tablet Take 1 1/2 tablet by mouth every other day      . gabapentin (NEURONTIN) 300 MG capsule Take 2 capsules (600 mg total) by mouth at bedtime.  60 capsule  11  . glipiZIDE (GLUCOTROL XL) 5 MG 24 hr tablet Take 5 mg by mouth daily.      Marland Kitchen HYDROcodone-acetaminophen (NORCO/VICODIN) 5-325 MG per tablet Take a 1/2 to 1 tablet by mouth three times daily as needed for pain.   NOT TO EXCEED 3 PER DAY.  90 tablet  5  . losartan (COZAAR) 50 MG tablet TAKE 1 TABLET ONCE DAILY.  30 tablet  1  . omeprazole (PRILOSEC) 20 MG capsule Take 20 mg by mouth daily. Take 1 capsule  by mouth 30 minutes before first meal of the day      . potassium chloride SA (K-DUR) 20 MEQ tablet       . pravastatin (PRAVACHOL) 40 MG tablet Take 1 tablet (40 mg total) by mouth daily.  30 tablet  11  . [DISCONTINUED] furosemide (LASIX) 40 MG tablet Take 1 tablet (40 mg total) by mouth as directed.  100 tablet  2  . [DISCONTINUED] K-DUR 20 MEQ tablet TAKE 1 TABLET DAILY.  30 tablet  6   No facility-administered encounter medications on file as of 04/22/2013.    Allergies  Allergen Reactions  . Celecoxib     GI discomfort  . Diclofenac Sodium     REACTION: causing burining sensation after use    Current Medications, Allergies, Past Medical History, Past Surgical History, Family History, and Social History were reviewed in [employer] record.    Review of Systems    See HPI - all other systems neg except as noted...  The patient complains of vision loss, decreased hearing, dyspnea on exertion, muscle weakness, and difficulty walking.  The patient denies anorexia, fever, weight loss, weight gain, hoarseness, chest  pain, syncope, peripheral edema, prolonged cough, headaches, hemoptysis, abdominal pain, melena, hematochezia, severe indigestion/heartburn, hematuria, incontinence, suspicious skin lesions, transient blindness, depression, unusual weight change, abnormal bleeding, enlarged lymph nodes, and angioedema.   Objective:   Physical Exam     WD, WN, 79 y/o WF chr ill appearing but in NAD... GENERAL:  Alert & oriented; pleasant & cooperative... sm bruise right occiput, no hematoma. HEENT:  [place]/AT, EOM-wnl, poor vision from macular degen; EACs-clear, TMs-wnl, NOSE-clear, THROAT-clear w/ dry MM's. NECK:  Supple w/ fairROM; no JVD; normal carotid impulses w/o bruits; no thyromegaly or nodules palpated; no lymphadenopathy. CHEST:  Clear to P & A; without wheezes/ rales/ or rhonchi. HEART:  irreg, gr 1/6 SEM without rubs or gallops detected... ABDOMEN:  Soft & nontender; normal bowel sounds; no organomegaly or masses detected. EXT:  severe arthritic changes & rightTKR; no varicose veins/ +venous insuffic/ tr edema. NEURO:  CN's intact; +gait abn, no focal neuro deficits x sl decr sensation in LE's... DERM:  No lesions noted; no rash etc...  RADIOLOGY DATA:  Reviewed in the EPIC EMR & discussed w/ the patient...  LABORATORY DATA:  Reviewed in the EPIC EMR & discussed w/ the patient...   Assessment & Plan:    She is still pretty amazing for 79 y/o!!!   HBP/ CHF (DD)/ AFib>  Asked again to bring all bottles to office for review==> we decided to continue Lasix40 and stress no salt, elev legs, support hose & allow an extra 20mg  lasix on the days when swelling doesn't go down overnight... NOTE> Creat=1.4 & BNP=187 on Lasix 40mg /d=> they increased Lasix to 40-60 Qod- ok for now...  CHOL>  Continue Prav40 Qhs and take regularly, Chol numbers look good...  DM>  What happened- BS=236, A1c=8.4 on the GlipER5 daily; we will incr to 5mg Bid & follow...  GERD>  Stable on PPI rx daily...  UTIs>  Treated  during the 5/12 & 2/13 hosp & NH stay; she had prob w/ bladder & followed by DrTannenbaum...  DJD/ Gait Abn/ etc>  She finished home therapy & drifted back into her old habits; encouraged to incr exercise, etc...  Dermatitis>  Try the Clobetasol cream...  Other medical problems as noted.Marland KitchenMarland KitchenMarland Kitchen

## 2013-04-23 MED ORDER — GLIPIZIDE ER 5 MG PO TB24: 5.0000 mg | ORAL_TABLET | Freq: Two times a day (BID) | ORAL | Status: DC

## 2013-06-12 ENCOUNTER — Other Ambulatory Visit: Payer: Self-pay | Admitting: Pulmonary Disease

## 2013-06-14 ENCOUNTER — Telehealth: Payer: Self-pay | Admitting: Pulmonary Disease

## 2013-06-14 MED ORDER — LOSARTAN POTASSIUM 50 MG PO TABS: ORAL_TABLET | ORAL | Status: DC

## 2013-06-14 NOTE — Telephone Encounter (Signed)
Called and spoke with peggy and she is aware of meds sent to the pharmacy and nothing further is needed.

## 2013-07-22 ENCOUNTER — Other Ambulatory Visit (INDEPENDENT_AMBULATORY_CARE_PROVIDER_SITE_OTHER): Payer: Medicare Other

## 2013-07-22 ENCOUNTER — Encounter: Payer: Self-pay | Admitting: Pulmonary Disease

## 2013-07-22 ENCOUNTER — Ambulatory Visit (INDEPENDENT_AMBULATORY_CARE_PROVIDER_SITE_OTHER): Payer: Medicare Other | Admitting: Pulmonary Disease

## 2013-07-22 VITALS — BP 126/80 | HR 82 | Temp 98.1°F | Ht 63.0 in | Wt 186.4 lb

## 2013-07-22 DIAGNOSIS — F411 Generalized anxiety disorder: Secondary | ICD-10-CM

## 2013-07-22 DIAGNOSIS — R269 Unspecified abnormalities of gait and mobility: Secondary | ICD-10-CM

## 2013-07-22 DIAGNOSIS — N390 Urinary tract infection, site not specified: Secondary | ICD-10-CM

## 2013-07-22 DIAGNOSIS — E119 Type 2 diabetes mellitus without complications: Secondary | ICD-10-CM

## 2013-07-22 DIAGNOSIS — I4891 Unspecified atrial fibrillation: Secondary | ICD-10-CM

## 2013-07-22 DIAGNOSIS — M1990 Unspecified osteoarthritis, unspecified site: Secondary | ICD-10-CM

## 2013-07-22 DIAGNOSIS — I10 Essential (primary) hypertension: Secondary | ICD-10-CM

## 2013-07-22 DIAGNOSIS — E7800 Pure hypercholesterolemia, unspecified: Secondary | ICD-10-CM

## 2013-07-22 DIAGNOSIS — R413 Other amnesia: Secondary | ICD-10-CM

## 2013-07-22 DIAGNOSIS — I519 Heart disease, unspecified: Secondary | ICD-10-CM

## 2013-07-22 DIAGNOSIS — I872 Venous insufficiency (chronic) (peripheral): Secondary | ICD-10-CM

## 2013-07-22 DIAGNOSIS — N289 Disorder of kidney and ureter, unspecified: Secondary | ICD-10-CM

## 2013-07-22 DIAGNOSIS — I5189 Other ill-defined heart diseases: Secondary | ICD-10-CM

## 2013-07-22 LAB — BASIC METABOLIC PANEL
BUN: 20 mg/dL (ref 6–23)
CO2: 30 mEq/L (ref 19–32)
Chloride: 98 mEq/L (ref 96–112)
Creatinine, Ser: 1.4 mg/dL — ABNORMAL HIGH (ref 0.4–1.2)
Glucose, Bld: 154 mg/dL — ABNORMAL HIGH (ref 70–99)

## 2013-07-22 LAB — URINALYSIS
Bilirubin Urine: NEGATIVE
Ketones, ur: NEGATIVE
Specific Gravity, Urine: 1.01 (ref 1.000–1.030)
Total Protein, Urine: NEGATIVE
Urine Glucose: NEGATIVE
pH: 6.5 (ref 5.0–8.0)

## 2013-07-22 LAB — HEMOGLOBIN A1C: Hgb A1c MFr Bld: 8.3 % — ABNORMAL HIGH (ref 4.6–6.5)

## 2013-07-22 NOTE — Patient Instructions (Addendum)
Today we updated your med list in our EPIC system...    Continue your current medications the same for now...  Today we did your follow up blood work & checked a urinalysis     We will contact you w/ the results when available...   We will arrange for some more home physical therapy to see if this helps her mobility...  Call for any questions...  Let's plan a follow up visit in 3-78mo, sooner if needed for problems.Marland KitchenMarland Kitchen

## 2013-07-23 ENCOUNTER — Telehealth: Payer: Self-pay | Admitting: Pulmonary Disease

## 2013-07-23 LAB — URINE CULTURE: Colony Count: NO GROWTH

## 2013-07-23 NOTE — Telephone Encounter (Signed)
Noted  

## 2013-07-24 ENCOUNTER — Other Ambulatory Visit: Payer: Self-pay | Admitting: Pulmonary Disease

## 2013-07-24 MED ORDER — METFORMIN HCL ER (MOD) 500 MG PO TB24: 500.0000 mg | ORAL_TABLET | Freq: Every day | ORAL | Status: DC

## 2013-07-25 NOTE — Progress Notes (Addendum)
Subjective:    Patient ID: [name], female    DOB: 11/12/1911, 79 y.o.   MRN: [id number]  HPI 79 y/o WF here for an add-on visit due to a fall yest... she has mult medical problems as noted below...   ~  July 25, 2012:  475mo ROV & daughter notes that she is less mobile, walking less due to trouble w/ vision & arthritis; incr stress as the daugh with whom she lives has been Dx w/ MultMyeloma on chemo now & other daugh (here today) has been trying to care for both;  Pt able to bathe 7 dress self but difficulty w/ mobility- offered PT/ OT but daugh says she refused to coop w/ Rx in past...    BP controlled on meds;  Chol & DM have been under adeq control for this 79 y/o lady;  She uses Vicodin as needed for pain...     We reviewed prob list, meds, xrays and labs> see below for updates >>  ~  January 21, 2013:  475mo ROV & Karuna turned 89 last month- they have noted some incr edema in legs R>L & she is not restricting sodium adequately; weight is up 13# by our scales to 186#; they DID NOT bring meds to the OV & there was some confusion on her Lasix dose- but confirmed that she is taking 40mg /d & labs today showed Creat=1.4 and BNP much improved to 187 (from 1545); this doesn't give [place] much wiggle room w/ her edema & suggest> NO SALT, elev legs, wear support hose, and consider extra Lasix40 prn on the days that edema doesn't go down overnight...  We reviewed the following medical problems during today's office visit >>     HBP> on Coreg25-1/2Bid, Losartan50, Lasix40mg /d, K20; BP= 126/88 & she denies HA, CP, palpit, ch in SOB...    AFib & CHF> on ASA325 + above meds; rate control strategy, no Coumadin due to falls; she has incr edema & wt as noted- but Cr=1.4 & BNP=187- therefore needs better no salt diet, elev legs, support hose & ok to take extra 1/2 lasix (for 60mg  total) if swelling doesn't go down overnight.    Chol> on Prav40 & FLP shows TChol 138, TG 169, HDL37, LDL 67... Continue same.    DM>  on GlipizER-5mg -1/2 daily & BS today=144, A1c=8.2 which is worse! Needs better low carb diet & incr GlipER to 5mg  Qam (watch BS at home).    GERD> on Prilosec20 & symptoms controlled...    Urology> followed by DrTannenbaum, emptying better & bladder function improved...    DJD> hx severe arthritis w/ right TKR & using Vicodin 2-3/d as needed; family notes less mobile but still up w/ walker daily.    Osteopenia> on OTC calcium, MVI, VitD2000u/d...    Gait Abn & Falls> on Neurontin300-2Qhs, followed by Ortho & Podiatry... We reviewed prob list, meds, xrays and labs> see below for updates >> she had Flu vaccine 9/13, Tetanus in 2011, and last Pneumovax in 2008... CXR 2/14 showed stable heart size, clear lungs, osteopenia in bones, NAD... LABS 2/14:  FLP- at goals on Prav40 x TG=169;  Chems- ok x BS=144 A1c=8.2 Creat=1.4 BNP=187;  CBC- wnl;  TSH=2/03...  ~  Apr 22, 2013:  475mo ROV & [name] has a new great grandchild;  daugh notes that her memory is worse, having more difficulties w/ ADLs/ showers, etc- They want visiting nurses, home help, etc & we will order home assessment.Marland KitchenMarland Kitchen  HBP> on Coreg25-1/2Bid, Losartan50, Lasix40 alt w/ 60 qod, K20-1/2; BP= 138/82 & she denies HA, CP, palpit, ch in SOB...    AFib & CHF> on ASA325 + above meds; rate control strategy, no Coumadin due to falls; she has incr edema & wt stable 185# but Cr=1.4 & BNP=187- therefore needs better no salt diet, elev legs, support hose & ok to take extra 1/2 lasix (for 60mg  total) if swelling doesn't go down overnight=> they've been taking 40-60 qod...    Chol> on Prav40 & FLP 2/14 showed TChol 138, TG 169, HDL 37, LDL 67... Continue same.    DM> on GlipizER-5mg /d & BS today=236, A1c=8.4 which is worse! Needs better low carb diet & incr GlipER to 5mg Bid (watch BS at home). We reviewed prob list, meds, xrays and labs> see below for updates >>  LABS 5/14:  Chems- BS=236, A1c=8.4, BUN=30, Cr=1.6.Marland Kitchen.  ~  July 22, 2013:  41mo ROV & recheck  since Glipiz5 incr to Bid last OV... They report appetite good, ?on diet, weight incr 1# to 186#;  We reviewed the following medical problems during today's office visit >>     HBP> on Coreg25-1/2Bid, Losartan50, Lasix40 alt w/ 60 qod, K20-1/2; BP= 126/80 & she denies HA, CP, palpit, ch in SOB...    AFib & CHF> on ASA325 + above meds; rate control strategy, no Coumadin due to falls; she had incr edema & wt stable ~185# w/ Cr=1.4 on Lasix 40-60 Qod; ok to continue same...    Chol> on Prav40 & FLP 2/14 showed TChol 138, TG 169, HDL 37, LDL 67... Continue same.    DM> on GlipizER-5mg Bid & BS today=154, A1c=8.3  Needs better low carb diet & we will add MetformER500Qam (don't want to push too hard)...    Urology> followed by DrTannenbaum, emptying better & bladder function improved; labs 8/14 w/ clear urine, neg C&S...    79y/o, senile dementia> she is not taking her Aricept & refuses memory meds- we removed it from her list... We reviewed prob list, meds, xrays and labs> see below for updates >> we offered home PT to help but she declines... LABS 8/14:  Chems- ok x BS=154, A1c=8.3, BUN=20, Cr=1.4;  Urine=clear & cult neg...           Problem List:  MACULAR DEGENERATION (ICD-362.50) - on OCUVITES daily... s/p laser surg by DrRankin... she cannot see well enough to read... they tried Bilberry Juice as heard on the [facility] [employer]- it's helping... ~  3/10: had f/u w/ DrRankin- stabilized... nothing else he can do... f/u 15yr. ~  3/11:  had f/u DrRankin> senile macular degen OU (worsening), vitreous detach OS (stable), bilat Drusen...    PULMONARY NODULE (ICD-518.89) - RUL nodule, eval 2007 w/ bx = granuloma...  ~  Serial CXRs have been stable... ~  CXR 3/11 showed chr changes & calcif granulomas, prob calcif of LAD seen on lat. ~  CXR 5/12 w/ Hosp for LLL infiltrate, improved serially w/ antibiotic rx. ~  CXR 2/13 showed cardiomeg, clear lungs, osteopenia & TSpine degen changes... ~  CXR 2/14 showed  stable heart size, clear lungs, osteopenia in bones, NAD...  HYPERTENSION (ICD-401.9) - she takes ASA 325mg /d, COREG25mg - 1/2Bid, LOSARTAN50mg /d, & LASIX40mg /d, K20 added 1/2 per day... ~  9/11:  she received letter from [facility] stating she should be on ACE or ARB Rx> add Losartan 25mg /d... ~  5/12:  Hosp by [facility] w/ LLL pneumonia, UTI, CHF/ DD & meds were changed/ adjusted> Losartan &  Atenolol stopped, Coreg added, Lasix decreased KCl added. ~  7/12:  1st post hosp (5/12) visit & meds reviewed & reconciled ~  11/12: On Coreg3.125Bid, Losartan25, Lasix20, K20; BP= 124/80 & she denies HA, CP, palpit, ch in SOB or edema... ~  5/13: On Coreg12.5Bid, Lasix20, K10; BP= 140/98 & wil will restart he LOSARTAN50... ~  8/13:  On Coreg12,5Bid, Losartan50, Lasix40, K20-1/2> BP= 122/72 & she denies CP, palpit, ch in SOB or edema... ~  2/14: on Coreg25-1/2Bid, Losartan50, Lasix40mg /d, K20; BP= 126/88 & she denies HA, CP, palpit, ch in SOB... ~  5/14: on Coreg25-1/2Bid, Losartan50, Lasix40 alt w/ 60 qod, K20-1/2; BP= 138/82 & she denies direct BP related symptoms... ~  8/14: on Coreg25-1/2Bid, Losartan50, Lasix40 alt w/ 60 qod, K20-1/2; BP= 126/80 & stable...  CONGESTIVE HEART FAILURE (ICD-428.0) - on above meds... ~  2DEcho 6/06 showed biatrial enlargement, mild MR/TR, norm LVF... ~  labs 3/11 showed BNP= 189... ~  Labs from 5/12 hosp reviewed in [place]... ~  Labs from 2/13 hosp reviewed in Epic... ~  Labs 2/14 showed BNP=187, and Creat=1.4...  ATRIAL FIBRILLATION, CHRONIC (ICD-427.31) - prev on Coumadin, pt & family decided to stop Coumadin in 2007, & now on ASA daily... rate control strategy w/ COREG currently...  VENOUS INSUFFICIENCY (ICD-459.81) - on low sodium diet, and Lasix... ~  2/14: wt up 13# to 186# assoc w/ incr LE edema R>L; we reviewed need for salt restriction, elev legs, wear support hose, continue Lasix40 7 ok to take extra 20mg  if swelling doesn't go down overnight...  HYPERCHOLESTEROLEMIA  (ICD-272.0) - on PRAVACHOL 40mg Qhs and tol well... ~  FLP 11/08 showed TChol 151, TG 152, HDL 38, LDL 82... > continue med & diet efforts. ~  FLP 3/09 shows TChol 206, TG 332, HDL 40, LDL 97... > cont same, get wt down! ~  FLP 3/10 showed TChol 172, TG 214, HDL 43, LDL 92... > improved- continue same. ~  FLP 3/11 showed TChol 167, TG 236, HDL 46, LDL 83 ~  FLP 9/11 on Prav40 showed TChol 144, TG 212, HDL 36, LDL 72... reminded of low fat diet rec... ~  It is difficult to get her in for FASTING blood work... ~  FLP 5/13 on Prav40 showed TChol 149, TG 154, HDL 43, LDL 75 ~  FLP 2/14 on Prav40 showed TChol 138, TG 169, HDL37, LDL 67   DM (ICD-250.00) - prev on Metform500bid, stopped 5/12 Hosp & ch to GLIPIZIDE XL 2.5mg  Qam... ~  labs 11/08 showed FBS=182, HgA1c=7.1... >continue attempts at diet control. ~  labs 3/09 showed  FBS=178, HgA1c=7.2... >she will need to start meds: Metformin ER 500mg Qam. ~  labs 7/09 showed BS= 138, HgA1c= 6.7.Marland KitchenMarland Kitchenrec- keep same.  ~  labs 3/10 (wt=162#) showed 139, HgA1c= 6.6... > continue same meds. ~  labs 9/10 (wt=175#) showed BS= 158, A1c= 6.8.Marland KitchenMarland Kitchen needs better diet. ~  labs 3/11 (wt=179#) showed BS= 155, A1c= 7.6.Marland KitchenMarland Kitchen may need incr meds! get wt down. ~  labs 9/11 (wt=173#) showed BS= 166, A1c= 8.0.Marland KitchenMarland Kitchen worsening A1c- rec incr Metform to 2/d... ~  Urine microalb 9/11 = neg... ~  Labs 5/12 Hosp reviewed in EChart> off Metform, on Glipizide 2.5mg /d... ~  Labs 7/12 in office on Glip2.5 showed BS= 101, A1c= 6.7.Marland KitchenMarland Kitchen rec to continue same. ~  11/12:  daugh states BS at home all ~100 on the Glip2.5 daily; needs to ret for labs & A1c. ~  Labs 5/13 showed BS= 123, A1c= 6.5.Marland KitchenMarland Kitchen Continue same Rx. ~  She reports Podiatric care from DrSikora- hammertoes, trims callouses Q65mo... ~  2/14: on GlipizER-5mg -1/2 daily & BS today=144, A1c=8.2 which is worse! Needs better low carb diet & incr GlipER to 5mg  Qam (watch BS at home). ~  5/14: on GlipizER-5mg /d & BS today=236, A1c=8.4 which is worse!  Needs better low carb diet & incr GlipER to 5mg Bid (watch BS at home). ~  8/14: on GlipizER-5mg Bid & BS today=154, A1c=8.3  Needs better low carb diet & we will add MetformER500Qam (don't want to push too hard).  GERD (ICD-530.81) - on PRILOSEC 20mg /d, doing well without heartburn, n/v, etc... Hx HH, GERD, and prev DU... last EGD was 7/02 showing HH, reflux, stricture, gastric ulcer... she has refused colonoscopies in the past...  UTI'S, HX OF (ICD-V13.00) - hx UTI's, s/p hysterectomy, prev bladder surg... ~  8/11:  ER visit after fall w/ UTI Rx'd w/ Macrobid... ~  Urinary incont/ bladder prob followed by DrTannenbaum...  DEGENERATIVE JOINT DISEASE (ICD-715.90)  - severe arthritis w/ prev right TKR 1990... followed by DrGioffre, DrBednarz, DrSikora (hammer toes)- not willing to consider more surg... uses Vicodin 2-3 per day as needed, shots not helpful in the past... leg discomfort may be neuropathy (but monofilament test is normal)- on NEURONTIN Rx taking 300mg - 2Qhs, & VICODIN as needed... ~  she ambulates w/ walker, she has had phys therapy & does chair exercises, she has knee braces, etc...  VITAMIN D DEFICIENCY (ICD-268.9) - not currently taking any supplement. ~  labs 7/09 showed Vit D level = 11... rec> start Vit D 50000 u weekly (but she stopped after 53mo). ~  labs 3/10 showed Vit D level = 30... rec> change to 2000 u daily. ~  Labs 3/12 showed Vit D level = 50 ~  Labs 5/13 showed Vit D level = 56  ABNORMALITY OF GAIT (ICD-781.2) & Frequent FALLS>  ~  9/11:  she fell 2wks ago- hit occiput w/ hematoma on scalp> ER eval reviewed: lost balance; fell into bathtub; no LOC;  labs- OK, BS=180, +UTI given macrobid;  CT Brain= atrophy, chr microvasc dis, NAD;  CT CSpine= multilevel spondylosis & suboccipital hematoma in soft tissues w/o fx etc...   ~  11/11:  she fell yest- hit right occiput on commode w/ bruise but no hematoma; didn't go to the ER; no focal neuro deficits & we discussed checking  CT Brain to be sure no subdural etc... ~  11/12:  She continues weak, gait abn, but manages her own ADLs ok...  ANXIETY (ICD-300.00)  DERMATITIS - uses generic LIDEX E cream as needed...  Health Maintenance:  she gets the yearly Flu vaccine each Autumn;  and she had additional PNEUMOVAX ~2008 at [facility], she says.   Past Surgical History  Procedure Laterality Date  . Cholecystectomy  6/07    Dr. Corliss Skains  . Total abdominal hysterectomy    . Bladder surgery    . Total knee arthroplasty  1990    right - Dr. Darrelyn Hillock    Outpatient Encounter Prescriptions as of 07/22/2013  Medication Sig Dispense Refill  . acetaminophen (TYLENOL) 325 MG tablet as needed - alternates this with hydrocodone       . aspirin 325 MG tablet Take 325 mg by mouth daily.      . carvedilol (COREG) 25 MG tablet TAKE (1/2) TABLET TWICE DAILY.  60 tablet  5  . Cholecalciferol (VITAMIN D) 2000 UNITS CAPS Take 2,000 Units by mouth daily. 1 capsule by mouth once daily      .  clobetasol cream (TEMOVATE) 0.05 % Apply topically 2 (two) times daily.  30 g  5  . Clobetasol Prop Crea-Coal Tar 0.05 & 2.3 % KIT Apply as directed      . furosemide (LASIX) 40 MG tablet 1 daily--alternate with 1.5 every other day      . glipiZIDE (GLUCOTROL XL) 5 MG 24 hr tablet Take 1 tablet (5 mg total) by mouth 2 (two) times daily.  60 tablet  6  . HYDROcodone-acetaminophen (NORCO/VICODIN) 5-325 MG per tablet Take a 1/2 to 1 tablet by mouth three times daily as needed for pain.   NOT TO EXCEED 3 PER DAY.  90 tablet  5  . losartan (COZAAR) 50 MG tablet TAKE 1 TABLET ONCE DAILY.  30 tablet  2  . omeprazole (PRILOSEC) 20 MG capsule Take 20 mg by mouth daily. Take 1 capsule by mouth 30 minutes before first meal of the day      . potassium chloride SA (K-DUR) 20 MEQ tablet Take 10 mEq by mouth daily.       . pravastatin (PRAVACHOL) 40 MG tablet Take 1 tablet (40 mg total) by mouth daily.  30 tablet  11  . [DISCONTINUED] gabapentin (NEURONTIN) 300 MG  capsule Take 2 capsules (600 mg total) by mouth at bedtime.  60 capsule  11  . [DISCONTINUED] donepezil (ARICEPT) 5 MG tablet Take 1 tablet (5 mg total) by mouth at bedtime as needed.  30 tablet  11   No facility-administered encounter medications on file as of 07/22/2013.    Allergies  Allergen Reactions  . Celecoxib     GI discomfort  . Diclofenac Sodium     REACTION: causing burining sensation after use    Current Medications, Allergies, Past Medical History, Past Surgical History, Family History, and Social History were reviewed in [employer] record.    Review of Systems    See HPI - all other systems neg except as noted...  The patient complains of vision loss, decreased hearing, dyspnea on exertion, muscle weakness, and difficulty walking.  The patient denies anorexia, fever, weight loss, weight gain, hoarseness, chest pain, syncope, peripheral edema, prolonged cough, headaches, hemoptysis, abdominal pain, melena, hematochezia, severe indigestion/heartburn, hematuria, incontinence, suspicious skin lesions, transient blindness, depression, unusual weight change, abnormal bleeding, enlarged lymph nodes, and angioedema.   Objective:   Physical Exam     WD, WN, 79 y/o WF chr ill appearing but in NAD... GENERAL:  Alert & oriented; pleasant & cooperative... sm bruise right occiput, no hematoma. HEENT:  [place]/AT, EOM-wnl, poor vision from macular degen; EACs-clear, TMs-wnl, NOSE-clear, THROAT-clear w/ dry MM's. NECK:  Supple w/ fairROM; no JVD; normal carotid impulses w/o bruits; no thyromegaly or nodules palpated; no lymphadenopathy. CHEST:  Clear to P & A; without wheezes/ rales/ or rhonchi. HEART:  irreg, gr 1/6 SEM without rubs or gallops detected... ABDOMEN:  Soft & nontender; normal bowel sounds; no organomegaly or masses detected. EXT:  severe arthritic changes & rightTKR; no varicose veins/ +venous insuffic/ tr edema. NEURO:  CN's intact; +gait abn, no  focal neuro deficits x sl decr sensation in LE's... DERM:  No lesions noted; no rash etc...  RADIOLOGY DATA:  Reviewed in the EPIC EMR & discussed w/ the patient...  LABORATORY DATA:  Reviewed in the EPIC EMR & discussed w/ the patient...   Assessment & Plan:    She is still pretty amazing for 79 y/o!!!   HBP/ CHF (DD)/ AFib>  Asked again to bring all  bottles to office for review==> we decided to continue Lasix40 and stress no salt, elev legs, support hose & allow an extra 20mg  lasix on the days when swelling doesn't go down overnight... NOTE> Creat=1.4 & BNP=187 on Lasix 40mg /d=> they increased Lasix to 40-60 Qod- ok for now...  CHOL>  Continue Prav40 Qhs and take regularly, Chol numbers look good...  DM>  Follow up labs- BS=154, A1c=8.3 on the GlipER5Bid; we decided to add METFORM-ER 500mg  Qam...  GERD>  Stable on PPI rx daily...  UTIs>  Treated during the 5/12 & 2/13 hosp & NH stay; she had prob w/ bladder & followed by DrTannenbaum; 8/14- UA & C/S are neg...  DJD/ Gait Abn/ etc>  She finished home therapy & drifted back into her old habits; encouraged to incr exercise, etc...  Dermatitis>  Try the Clobetasol cream...  Other medical problems as noted....   Patient's Medications  New Prescriptions   METFORMIN (GLUMETZA) 500 MG (MOD) 24 HR TABLET    Take 1 tablet (500 mg total) by mouth daily with breakfast.  Previous Medications   ACETAMINOPHEN (TYLENOL) 325 MG TABLET    as needed - alternates this with hydrocodone    ASPIRIN 325 MG TABLET    Take 325 mg by mouth daily.   CARVEDILOL (COREG) 25 MG TABLET    TAKE (1/2) TABLET TWICE DAILY.   CHOLECALCIFEROL (VITAMIN D) 2000 UNITS CAPS    Take 2,000 Units by mouth daily. 1 capsule by mouth once daily   CLOBETASOL CREAM (TEMOVATE) 0.05 %    Apply topically 2 (two) times daily.   CLOBETASOL PROP CREA-COAL TAR 0.05 & 2.3 % KIT    Apply as directed   FUROSEMIDE (LASIX) 40 MG TABLET    1 daily--alternate with 1.5 every other day    GLIPIZIDE (GLUCOTROL XL) 5 MG 24 HR TABLET    Take 1 tablet (5 mg total) by mouth 2 (two) times daily.   HYDROCODONE-ACETAMINOPHEN (NORCO/VICODIN) 5-325 MG PER TABLET    Take a 1/2 to 1 tablet by mouth three times daily as needed for pain.   NOT TO EXCEED 3 PER DAY.   LOSARTAN (COZAAR) 50 MG TABLET    TAKE 1 TABLET ONCE DAILY.   OMEPRAZOLE (PRILOSEC) 20 MG CAPSULE    Take 20 mg by mouth daily. Take 1 capsule by mouth 30 minutes before first meal of the day   POTASSIUM CHLORIDE SA (K-DUR) 20 MEQ TABLET    Take 10 mEq by mouth daily.    PRAVASTATIN (PRAVACHOL) 40 MG TABLET    Take 1 tablet (40 mg total) by mouth daily.  Modified Medications   Modified Medication Previous Medication   GABAPENTIN (NEURONTIN) 300 MG CAPSULE gabapentin (NEURONTIN) 300 MG capsule      TAKE 2 CAPSULES AT BEDTIME.    Take 2 capsules (600 mg total) by mouth at bedtime.  Discontinued Medications   DONEPEZIL (ARICEPT) 5 MG TABLET    Take 1 tablet (5 mg total) by mouth at bedtime as needed.

## 2013-07-30 ENCOUNTER — Other Ambulatory Visit: Payer: Self-pay | Admitting: Pulmonary Disease

## 2013-07-30 MED ORDER — METFORMIN HCL ER 500 MG PO TB24: 500.0000 mg | ORAL_TABLET | Freq: Every day | ORAL | Status: DC

## 2013-07-30 NOTE — Telephone Encounter (Signed)
[facility] faxed over request to change the glumetza 500 mg  To the glucophage xr 500mg  since this is a generic and the glumetza does not have a generic.  Ok per SN to send in the generic.  Nothing further is needed.

## 2013-07-31 ENCOUNTER — Other Ambulatory Visit: Payer: Self-pay | Admitting: Pulmonary Disease

## 2013-08-06 ENCOUNTER — Telehealth: Payer: Self-pay | Admitting: Pulmonary Disease

## 2013-08-06 NOTE — Telephone Encounter (Signed)
Form are on SN cart to review.

## 2013-08-13 NOTE — Telephone Encounter (Signed)
Called and spoke with farrah at [employer] and she stated that the forms do not need to be completed since there was never a home visit made.  The pt refused after the order was placed after her OV with SN.

## 2013-09-05 ENCOUNTER — Telehealth: Payer: Self-pay | Admitting: Pulmonary Disease

## 2013-09-05 MED ORDER — HYDROCODONE-ACETAMINOPHEN 5-325 MG PO TABS: ORAL_TABLET | ORAL | Status: DC

## 2013-09-05 NOTE — Telephone Encounter (Signed)
I have called RX into [facility]. I advised daughter that as of 09/09/13 when pt needs a refill she will have to pick up RX and take this by hand. Nothing further needed

## 2013-09-30 ENCOUNTER — Telehealth: Payer: Self-pay | Admitting: Pulmonary Disease

## 2013-09-30 NOTE — Telephone Encounter (Signed)
I spoke with the pt daughter and she has several issues to discuss:  1) pt has been having a lot of confusion lately as well as sleeping a lot. She states yesterday she slept all day.  2) Pt has been having a lot of swelling on her left knee. She states she has trouble putting any weight on the knee at all.  3) She is having to wear depends due to incontinence that has gotten much worse.  4) she has a rash in her groin, they have been putting neosporin on it and this has helped some, but it is not gone.  5) She has a place on her right leg that is dark purple and raised. Denies any trauma.  6) requesting an order for a hospital bed 7) Wants refill on hydrocodone, coreg, and lasix.  I set the pt for an appt with SN tomorrow at 3pm to discuss all of this. I will print this note so Sn is aware of all they need to discuss. Carron Curie, CMA

## 2013-10-01 ENCOUNTER — Ambulatory Visit (INDEPENDENT_AMBULATORY_CARE_PROVIDER_SITE_OTHER): Payer: Medicare Other | Admitting: Pulmonary Disease

## 2013-10-01 ENCOUNTER — Encounter: Payer: Self-pay | Admitting: Pulmonary Disease

## 2013-10-01 ENCOUNTER — Other Ambulatory Visit (INDEPENDENT_AMBULATORY_CARE_PROVIDER_SITE_OTHER): Payer: Medicare Other

## 2013-10-01 VITALS — BP 118/80 | HR 69 | Temp 97.7°F | Ht 63.0 in

## 2013-10-01 DIAGNOSIS — R0600 Dyspnea, unspecified: Secondary | ICD-10-CM

## 2013-10-01 DIAGNOSIS — I519 Heart disease, unspecified: Secondary | ICD-10-CM

## 2013-10-01 DIAGNOSIS — N289 Disorder of kidney and ureter, unspecified: Secondary | ICD-10-CM

## 2013-10-01 DIAGNOSIS — E7800 Pure hypercholesterolemia, unspecified: Secondary | ICD-10-CM

## 2013-10-01 DIAGNOSIS — R413 Other amnesia: Secondary | ICD-10-CM

## 2013-10-01 DIAGNOSIS — F411 Generalized anxiety disorder: Secondary | ICD-10-CM

## 2013-10-01 DIAGNOSIS — I10 Essential (primary) hypertension: Secondary | ICD-10-CM

## 2013-10-01 DIAGNOSIS — I5189 Other ill-defined heart diseases: Secondary | ICD-10-CM

## 2013-10-01 DIAGNOSIS — M1990 Unspecified osteoarthritis, unspecified site: Secondary | ICD-10-CM

## 2013-10-01 DIAGNOSIS — R5381 Other malaise: Secondary | ICD-10-CM

## 2013-10-01 DIAGNOSIS — I872 Venous insufficiency (chronic) (peripheral): Secondary | ICD-10-CM

## 2013-10-01 DIAGNOSIS — I4891 Unspecified atrial fibrillation: Secondary | ICD-10-CM

## 2013-10-01 DIAGNOSIS — R0609 Other forms of dyspnea: Secondary | ICD-10-CM

## 2013-10-01 DIAGNOSIS — L309 Dermatitis, unspecified: Secondary | ICD-10-CM

## 2013-10-01 DIAGNOSIS — R531 Weakness: Secondary | ICD-10-CM

## 2013-10-01 DIAGNOSIS — R269 Unspecified abnormalities of gait and mobility: Secondary | ICD-10-CM

## 2013-10-01 DIAGNOSIS — R627 Adult failure to thrive: Secondary | ICD-10-CM | POA: Insufficient documentation

## 2013-10-01 DIAGNOSIS — L259 Unspecified contact dermatitis, unspecified cause: Secondary | ICD-10-CM

## 2013-10-01 DIAGNOSIS — E119 Type 2 diabetes mellitus without complications: Secondary | ICD-10-CM

## 2013-10-01 LAB — BASIC METABOLIC PANEL
CO2: 32 mEq/L (ref 19–32)
Chloride: 99 mEq/L (ref 96–112)
Creatinine, Ser: 1.6 mg/dL — ABNORMAL HIGH (ref 0.4–1.2)
GFR: 31.46 mL/min — ABNORMAL LOW (ref >60.00)
Glucose, Bld: 119 mg/dL — ABNORMAL HIGH (ref 70–99)
Potassium: 4.8 mEq/L (ref 3.5–5.1)
Sodium: 141 mEq/L (ref 135–145)

## 2013-10-01 LAB — CBC WITH DIFFERENTIAL/PLATELET
Basophils Absolute: 0.1 10*3/uL (ref 0.0–0.1)
Eosinophils Absolute: 0.2 10*3/uL (ref 0.0–0.7)
Eosinophils Relative: 2.3 % (ref 0.0–5.0)
HCT: 44.9 % (ref 36.0–46.0)
Lymphs Abs: 1.6 10*3/uL (ref 0.7–4.0)
Monocytes Absolute: 1 10*3/uL (ref 0.1–1.0)
Monocytes Relative: 13.1 % — ABNORMAL HIGH (ref 3.0–12.0)
Neutro Abs: 5 10*3/uL (ref 1.4–7.7)
Platelets: 148 10*3/uL — ABNORMAL LOW (ref 150.0–400.0)
RBC: 4.83 Mil/uL (ref 3.87–5.11)
RDW: 14.3 % (ref 11.5–14.6)

## 2013-10-01 LAB — HEPATIC FUNCTION PANEL
AST: 23 U/L (ref 0–37)
Albumin: 3.4 g/dL — ABNORMAL LOW (ref 3.5–5.2)
Alkaline Phosphatase: 85 U/L (ref 39–117)

## 2013-10-01 LAB — TSH: TSH: 1.27 u[IU]/mL (ref 0.35–5.50)

## 2013-10-01 MED ORDER — FUROSEMIDE 40 MG PO TABS: ORAL_TABLET | ORAL | Status: DC

## 2013-10-01 MED ORDER — CARVEDILOL 25 MG PO TABS: ORAL_TABLET | ORAL | Status: DC

## 2013-10-01 MED ORDER — HYDROCODONE-ACETAMINOPHEN 5-325 MG PO TABS: ORAL_TABLET | ORAL | Status: DC

## 2013-10-01 MED ORDER — CLOTRIMAZOLE-BETAMETHASONE 1-0.05 % EX CREA: TOPICAL_CREAM | CUTANEOUS | Status: DC

## 2013-10-01 NOTE — Progress Notes (Signed)
Subjective:     Patient ID: [name], female   DOB: 1911/04/24, 79 y.o.   MRN: [id number] HPI Review of Systems Physical Exam Subjective:    Patient ID: [name], female    DOB: 08-Jul-1911, 79 y.o.   MRN: [id number]  HPI:   79 y/o WF here for an add-on visit due to a fall yest... she has mult medical problems as noted below...   ~  July 25, 2012:  64mo ROV & daughter notes that she is less mobile, walking less due to trouble w/ vision & arthritis; incr stress as the daugh with whom she lives has been Dx w/ MultMyeloma on chemo now & other daugh (here today) has been trying to care for both;  Pt able to bathe 7 dress self but difficulty w/ mobility- offered PT/ OT but daugh says she refused to coop w/ Rx in past...    BP controlled on meds;  Chol & DM have been under adeq control for this 79 y/o lady;  She uses Vicodin as needed for pain...     We reviewed prob list, meds, xrays and labs> see below for updates >>  ~  January 21, 2013:  4mo ROV & [name] turned 89 last month- they have noted some incr edema in legs R>L & she is not restricting sodium adequately; weight is up 13# by our scales to 186#; they DID NOT bring meds to the OV & there was some confusion on her Lasix dose- but confirmed that she is taking 40mg /d & labs today showed Creat=1.4 and BNP much improved to 187 (from 1545); this doesn't give [place] much wiggle room w/ her edema & suggest> NO SALT, elev legs, wear support hose, and consider extra Lasix40 prn on the days that edema doesn't go down overnight...  We reviewed the following medical problems during today's office visit >>     HBP> on Coreg25-1/2Bid, Losartan50, Lasix40mg /d, K20; BP= 126/88 & she denies HA, CP, palpit, ch in SOB...    AFib & CHF> on ASA325 + above meds; rate control strategy, no Coumadin due to falls; she has incr edema & wt as noted- but Cr=1.4 & BNP=187- therefore needs better no salt diet, elev legs, support hose & ok to take extra 1/2 lasix (for 60mg   total) if swelling doesn't go down overnight.    Chol> on Prav40 & FLP shows TChol 138, TG 169, HDL37, LDL 67... Continue same.    DM> on GlipizER-5mg -1/2 daily & BS today=144, A1c=8.2 which is worse! Needs better low carb diet & incr GlipER to 5mg  Qam (watch BS at home).    GERD> on Prilosec20 & symptoms controlled...    Urology> followed by DrTannenbaum, emptying better & bladder function improved...    DJD> hx severe arthritis w/ right TKR & using Vicodin 2-3/d as needed; family notes less mobile but still up w/ walker daily.    Osteopenia> on OTC calcium, MVI, VitD2000u/d...    Gait Abn & Falls> on Neurontin300-2Qhs, followed by Ortho & Podiatry... We reviewed prob list, meds, xrays and labs> see below for updates >> she had Flu vaccine 9/13, Tetanus in 2011, and last Pneumovax in 2008... CXR 2/14 showed stable heart size, clear lungs, osteopenia in bones, NAD... LABS 2/14:  FLP- at goals on Prav40 x TG=169;  Chems- ok x BS=144 A1c=8.2 Creat=1.4 BNP=187;  CBC- wnl;  TSH=2/03...  ~  Apr 22, 2013:  64mo ROV & Vicy has a new great grandchild;  daugh notes  that her memory is worse, having more difficulties w/ ADLs/ showers, etc- They want visiting nurses, home help, etc & we will order home assessment...     HBP> on Coreg25-1/2Bid, Losartan50, Lasix40 alt w/ 60 qod, K20-1/2; BP= 138/82 & she denies HA, CP, palpit, ch in SOB...    AFib & CHF> on ASA325 + above meds; rate control strategy, no Coumadin due to falls; she has incr edema & wt stable 185# but Cr=1.4 & BNP=187- therefore needs better no salt diet, elev legs, support hose & ok to take extra 1/2 lasix (for 60mg  total) if swelling doesn't go down overnight=> they've been taking 40-60 qod...    Chol> on Prav40 & FLP 2/14 showed TChol 138, TG 169, HDL 37, LDL 67... Continue same.    DM> on GlipizER-5mg /d & BS today=236, A1c=8.4 which is worse! Needs better low carb diet & incr GlipER to 5mg Bid (watch BS at home). We reviewed prob list, meds, xrays  and labs> see below for updates >>  LABS 5/14:  Chems- BS=236, A1c=8.4, BUN=30, Cr=1.6.Marland Kitchen.  ~  July 22, 2013:  24mo ROV & recheck since Glipiz5 incr to Bid last OV... They report appetite good, ?on diet, weight incr 1# to 186#;  We reviewed the following medical problems during today's office visit >>     HBP> on Coreg25-1/2Bid, Losartan50, Lasix40 alt w/ 60 qod, K20-1/2; BP= 126/80 & she denies HA, CP, palpit, ch in SOB...    AFib & CHF> on ASA325 + above meds; rate control strategy, no Coumadin due to falls; she had incr edema & wt stable ~185# w/ Cr=1.4 on Lasix 40-60 Qod; ok to continue same...    Chol> on Prav40 & FLP 2/14 showed TChol 138, TG 169, HDL 37, LDL 67... Continue same.    DM> on GlipizER-5mg Bid & BS today=154, A1c=8.3  Needs better low carb diet & we will add MetformER500Qam (don't want to push too hard)...    Urology> followed by DrTannenbaum, emptying better & bladder function improved; labs 8/14 w/ clear urine, neg C&S...    79y/o, senile dementia> she is not taking her Aricept & refuses memory meds- we removed it from her list... We reviewed prob list, meds, xrays and labs> see below for updates >> we offered home PT to help but she declines... LABS 8/14:  Chems- ok x BS=154, A1c=8.3, BUN=20, Cr=1.4;  Urine=clear & cult neg...  ~  October 01, 2013:  36mo ROV & [name] is worse- unable to stand due to knee arthritis (Hx right TKR DrGioffre) and weakness; urinary incont & wearing depends; rash in groin that hasn't improved w/ their OTC creams; several ecchymoses on legs from minor trauma; daughters have done a great job caring for their 77y/o mother but her care needs are mounting & their health is fair (one w/ cancer); they are contemplating a hosp bed for home use vs check up by DrGioffre vs their options for NHP but they haven't made up their minds as yet; pt is [facility] and increasingly forgetful & intermittently confused - they are really struggling w/ the decision... We reviewed prob  list, meds, xrays and labs> see below for updates >>  LABS 10/14:  pending           Problem List:  MACULAR DEGENERATION (ICD-362.50) - on OCUVITES daily... s/p laser surg by DrRankin... she cannot see well enough to read... they tried Bilberry Juice as heard on the [facility] [employer]- it's helping... ~  3/10: had f/u w/ DrRankin- stabilized.Marland KitchenMarland Kitchen  nothing else he can do... f/u 3yr. ~  3/11:  had f/u DrRankin> senile macular degen OU (worsening), vitreous detach OS (stable), bilat Drusen...    PULMONARY NODULE (ICD-518.89) - RUL nodule, eval 2007 w/ bx = granuloma...  ~  Serial CXRs have been stable... ~  CXR 3/11 showed chr changes & calcif granulomas, prob calcif of LAD seen on lat. ~  CXR 5/12 w/ Hosp for LLL infiltrate, improved serially w/ antibiotic rx. ~  CXR 2/13 showed cardiomeg, clear lungs, osteopenia & TSpine degen changes... ~  CXR 2/14 showed stable heart size, clear lungs, osteopenia in bones, NAD...  HYPERTENSION (ICD-401.9) - she takes ASA 325mg /d, COREG25mg - 1/2Bid, LOSARTAN50mg /d, & LASIX40mg /d, K20 added 1/2 per day... ~  9/11:  she received letter from [facility] stating she should be on ACE or ARB Rx> add Losartan 25mg /d... ~  5/12:  Hosp by [facility] w/ LLL pneumonia, UTI, CHF/ DD & meds were changed/ adjusted> Losartan & Atenolol stopped, Coreg added, Lasix decreased KCl added. ~  7/12:  1st post hosp (5/12) visit & meds reviewed & reconciled ~  11/12: On Coreg3.125Bid, Losartan25, Lasix20, K20; BP= 124/80 & she denies HA, CP, palpit, ch in SOB or edema... ~  5/13: On Coreg12.5Bid, Lasix20, K10; BP= 140/98 & wil will restart he LOSARTAN50... ~  8/13:  On Coreg12,5Bid, Losartan50, Lasix40, K20-1/2> BP= 122/72 & she denies CP, palpit, ch in SOB or edema... ~  2/14: on Coreg25-1/2Bid, Losartan50, Lasix40mg /d, K20; BP= 126/88 & she denies HA, CP, palpit, ch in SOB... ~  5/14: on Coreg25-1/2Bid, Losartan50, Lasix40 alt w/ 60 qod, K20-1/2; BP= 138/82 & she denies direct BP related  symptoms... ~  8/14: on Coreg25-1/2Bid, Losartan50, Lasix40 alt w/ 60 qod, K20-1/2; BP= 126/80 & stable...  CONGESTIVE HEART FAILURE (ICD-428.0) - on above meds... ~  2DEcho 6/06 showed biatrial enlargement, mild MR/TR, norm LVF... ~  labs 3/11 showed BNP= 189... ~  Labs from 5/12 hosp reviewed in [place]... ~  Labs from 2/13 hosp reviewed in Epic... ~  Labs 2/14 showed BNP=187, and Creat=1.4...  ATRIAL FIBRILLATION, CHRONIC (ICD-427.31) - prev on Coumadin, pt & family decided to stop Coumadin in 2007, & now on ASA daily... rate control strategy w/ COREG currently...  VENOUS INSUFFICIENCY (ICD-459.81) - on low sodium diet, and Lasix... ~  2/14: wt up 13# to 186# assoc w/ incr LE edema R>L; we reviewed need for salt restriction, elev legs, wear support hose, continue Lasix40 7 ok to take extra 20mg  if swelling doesn't go down overnight...  HYPERCHOLESTEROLEMIA (ICD-272.0) - on PRAVACHOL 40mg Qhs and tol well... ~  FLP 11/08 showed TChol 151, TG 152, HDL 38, LDL 82... > continue med & diet efforts. ~  FLP 3/09 shows TChol 206, TG 332, HDL 40, LDL 97... > cont same, get wt down! ~  FLP 3/10 showed TChol 172, TG 214, HDL 43, LDL 92... > improved- continue same. ~  FLP 3/11 showed TChol 167, TG 236, HDL 46, LDL 83 ~  FLP 9/11 on Prav40 showed TChol 144, TG 212, HDL 36, LDL 72... reminded of low fat diet rec... ~  It is difficult to get her in for FASTING blood work... ~  FLP 5/13 on Prav40 showed TChol 149, TG 154, HDL 43, LDL 75 ~  FLP 2/14 on Prav40 showed TChol 138, TG 169, HDL37, LDL 67   DM (ICD-250.00) - prev on Metform500bid, stopped 5/12 Hosp & ch to GLIPIZIDE XL 2.5mg  Qam... ~  labs 11/08 showed FBS=182, HgA1c=7.1... >continue  attempts at diet control. ~  labs 3/09 showed  FBS=178, HgA1c=7.2... >she will need to start meds: Metformin ER 500mg Qam. ~  labs 7/09 showed BS= 138, HgA1c= 6.7.Marland KitchenMarland Kitchenrec- keep same.  ~  labs 3/10 (wt=162#) showed 139, HgA1c= 6.6... > continue same meds. ~  labs  9/10 (wt=175#) showed BS= 158, A1c= 6.8.Marland KitchenMarland Kitchen needs better diet. ~  labs 3/11 (wt=179#) showed BS= 155, A1c= 7.6.Marland KitchenMarland Kitchen may need incr meds! get wt down. ~  labs 9/11 (wt=173#) showed BS= 166, A1c= 8.0.Marland KitchenMarland Kitchen worsening A1c- rec incr Metform to 2/d... ~  Urine microalb 9/11 = neg... ~  Labs 5/12 Hosp reviewed in EChart> off Metform, on Glipizide 2.5mg /d... ~  Labs 7/12 in office on Glip2.5 showed BS= 101, A1c= 6.7.Marland KitchenMarland Kitchen rec to continue same. ~  11/12:  daugh states BS at home all ~100 on the Glip2.5 daily; needs to ret for labs & A1c. ~  Labs 5/13 showed BS= 123, A1c= 6.5.Marland KitchenMarland Kitchen Continue same Rx. ~  She reports Podiatric care from DrSikora- hammertoes, trims callouses Q35mo... ~  2/14: on GlipizER-5mg -1/2 daily & BS today=144, A1c=8.2 which is worse! Needs better low carb diet & incr GlipER to 5mg  Qam (watch BS at home). ~  5/14: on GlipizER-5mg /d & BS today=236, A1c=8.4 which is worse! Needs better low carb diet & incr GlipER to 5mg Bid (watch BS at home). ~  8/14: on GlipizER-5mg Bid & BS today=154, A1c=8.3  Needs better low carb diet & we will add MetformER500Qam (don't want to push too hard).  GERD (ICD-530.81) - on PRILOSEC 20mg /d, doing well without heartburn, n/v, etc... Hx HH, GERD, and prev DU... last EGD was 7/02 showing HH, reflux, stricture, gastric ulcer... she has refused colonoscopies in the past...  UTI'S, HX OF (ICD-V13.00) - hx UTI's, s/p hysterectomy, prev bladder surg... ~  8/11:  ER visit after fall w/ UTI Rx'd w/ Macrobid... ~  Urinary incont/ bladder prob followed by DrTannenbaum...  DEGENERATIVE JOINT DISEASE (ICD-715.90)  - severe arthritis w/ prev right TKR 1990... followed by DrGioffre, DrBednarz, DrSikora (hammer toes)- not willing to consider more surg... uses Vicodin 2-3 per day as needed, shots not helpful in the past... leg discomfort may be neuropathy (but monofilament test is normal)- on NEURONTIN Rx taking 300mg - 2Qhs, & VICODIN as needed... ~  she ambulates w/ walker, she has had  phys therapy & does chair exercises, she has knee braces, etc...  VITAMIN D DEFICIENCY (ICD-268.9) - not currently taking any supplement. ~  labs 7/09 showed Vit D level = 11... rec> start Vit D 50000 u weekly (but she stopped after 79mo). ~  labs 3/10 showed Vit D level = 30... rec> change to 2000 u daily. ~  Labs 3/12 showed Vit D level = 50 ~  Labs 5/13 showed Vit D level = 56  ABNORMALITY OF GAIT (ICD-781.2) & Frequent FALLS>  ~  9/11:  she fell 2wks ago- hit occiput w/ hematoma on scalp> ER eval reviewed: lost balance; fell into bathtub; no LOC;  labs- OK, BS=180, +UTI given macrobid;  CT Brain= atrophy, chr microvasc dis, NAD;  CT CSpine= multilevel spondylosis & suboccipital hematoma in soft tissues w/o fx etc...   ~  11/11:  she fell yest- hit right occiput on commode w/ bruise but no hematoma; didn't go to the ER; no focal neuro deficits & we discussed checking CT Brain to be sure no subdural etc... ~  11/12:  She continues weak, gait abn, but manages her own ADLs ok...  ANXIETY (ICD-300.00)  DERMATITIS -  uses generic LIDEX E cream as needed...  Health Maintenance:  she gets the yearly Flu vaccine each Autumn;  and she had additional P[facility]UMOVAX ~2008 at [facility], she says.   Past Surgical History  Procedure Laterality Date  . Cholecystectomy  6/07    Dr. Corliss Skains  . Total abdominal hysterectomy    . Bladder surgery    . Total knee arthroplasty  1990    right - Dr. Darrelyn Hillock    Outpatient Encounter Prescriptions as of 10/01/2013  Medication Sig Dispense Refill  . acetaminophen (TYLENOL) 325 MG tablet as needed - alternates this with hydrocodone       . aspirin 325 MG tablet Take 325 mg by mouth daily.      . carvedilol (COREG) 25 MG tablet TAKE (1/2) TABLET TWICE DAILY.  60 tablet  5  . Cholecalciferol (VITAMIN D) 2000 UNITS CAPS Take 2,000 Units by mouth daily. 1 capsule by mouth once daily      . clobetasol cream (TEMOVATE) 0.05 % Apply topically 2 (two) times daily.  30 g  5  .  furosemide (LASIX) 40 MG tablet 1 daily--alternate with 1.5 every other day      . gabapentin (NEURONTIN) 300 MG capsule TAKE 2 CAPSULES AT BEDTIME.  60 capsule  11  . glipiZIDE (GLUCOTROL XL) 5 MG 24 hr tablet Take 1 tablet (5 mg total) by mouth 2 (two) times daily.  60 tablet  6  . HYDROcodone-acetaminophen (NORCO/VICODIN) 5-325 MG per tablet Take a 1/2 to 1 tablet by mouth three times daily as needed for pain.   NOT TO EXCEED 3 PER DAY.  90 tablet  0  . losartan (COZAAR) 50 MG tablet TAKE 1 TABLET ONCE DAILY.  30 tablet  2  . metFORMIN (GLUCOPHAGE-XR) 500 MG 24 hr tablet Take 1 tablet (500 mg total) by mouth daily with breakfast.  30 tablet  6  . omeprazole (PRILOSEC) 20 MG capsule Take 20 mg by mouth daily. Take 1 capsule by mouth 30 minutes before first meal of the day      . potassium chloride SA (K-DUR) 20 MEQ tablet Take 10 mEq by mouth daily.       . pravastatin (PRAVACHOL) 40 MG tablet TAKE 1 TABLET ONCE DAILY.  30 tablet  6  . [DISCONTINUED] Clobetasol Prop Crea-Coal Tar 0.05 & 2.3 % KIT Apply as directed       No facility-administered encounter medications on file as of 10/01/2013.    Allergies  Allergen Reactions  . Celecoxib     GI discomfort  . Diclofenac Sodium     REACTION: causing burining sensation after use    Current Medications, Allergies, Past Medical History, Past Surgical History, Family History, and Social History were reviewed in [employer] record.    Review of Systems    See HPI - all other systems neg except as noted...  The patient complains of vision loss, decreased hearing, dyspnea on exertion, muscle weakness, and difficulty walking.  The patient denies anorexia, fever, weight loss, weight gain, hoarseness, chest pain, syncope, peripheral edema, prolonged cough, headaches, hemoptysis, abdominal pain, melena, hematochezia, severe indigestion/heartburn, hematuria, incontinence, suspicious skin lesions, transient blindness, depression,  unusual weight change, abnormal bleeding, enlarged lymph nodes, and angioedema.   Objective:   Physical Exam     WD, WN, 79 y/o WF chr ill appearing but in NAD... GENERAL:  Alert & oriented; pleasant & cooperative... sm bruise right occiput, no hematoma. HEENT:  [place]/AT, EOM-wnl, poor vision  from macular degen; EACs-clear, TMs-wnl, NOSE-clear, THROAT-clear w/ dry MM's. NECK:  Supple w/ fairROM; no JVD; normal carotid impulses w/o bruits; no thyromegaly or nodules palpated; no lymphadenopathy. CHEST:  Clear to P & A; without wheezes/ rales/ or rhonchi. HEART:  irreg, gr 1/6 SEM without rubs or gallops detected... ABDOMEN:  Soft & nontender; normal bowel sounds; no organomegaly or masses detected. EXT:  severe arthritic changes & rightTKR; no varicose veins/ +venous insuffic/ tr edema. NEURO:  CN's intact; +gait abn, no focal neuro deficits x sl decr sensation in LE's... DERM:  No lesions noted; no rash etc...  RADIOLOGY DATA:  Reviewed in the EPIC EMR & discussed w/ the patient...  LABORATORY DATA:  Reviewed in the EPIC EMR & discussed w/ the patient...   Assessment & Plan:    HBP/ CHF (DD)/ AFib>  Asked again to bring all bottles to office for review==> we decided to continue Lasix40 and stress no salt, elev legs, support hose & allow an extra 20mg  lasix on the days when swelling doesn't go down overnight... NOTE> Creat=1.4 & BNP=187 on Lasix 40mg /d=> they increased Lasix to 40-60 Qod- ok for now...  CHOL>  Continue Prav40 Qhs and take regularly, Chol numbers look good...  DM>  Follow up labs- BS=154, A1c=8.3 on the GlipER5Bid; we decided to add METFORM-ER 500mg  Qam...  GERD>  Stable on PPI rx daily...  UTIs>  Treated during the 5/12 & 2/13 hosp & NH stay; she had prob w/ bladder & followed by DrTannenbaum; 8/14- UA & C/S are neg...  DJD/ Gait Abn/ etc>  She finished home therapy & drifted back into her old habits; encouraged to incr exercise, etc...  Dermatitis>  Try the  Clobetasol cream...  Other medical problems as noted....   Patient's Medications  New Prescriptions   CLOTRIMAZOLE-BETAMETHASONE (LOTRISONE) CREAM    Apply to rash two times daily  Previous Medications   ACETAMINOPHEN (TYLENOL) 325 MG TABLET    as needed - alternates this with hydrocodone    ASPIRIN 325 MG TABLET    Take 325 mg by mouth daily.   CHOLECALCIFEROL (VITAMIN D) 2000 UNITS CAPS    Take 2,000 Units by mouth daily. 1 capsule by mouth once daily   CLOBETASOL CREAM (TEMOVATE) 0.05 %    Apply topically 2 (two) times daily.   GABAPENTIN (NEURONTIN) 300 MG CAPSULE    TAKE 2 CAPSULES AT BEDTIME.   GLIPIZIDE (GLUCOTROL XL) 5 MG 24 HR TABLET    Take 1 tablet (5 mg total) by mouth 2 (two) times daily.   LOSARTAN (COZAAR) 50 MG TABLET    TAKE 1 TABLET ONCE DAILY.   METFORMIN (GLUCOPHAGE-XR) 500 MG 24 HR TABLET    Take 1 tablet (500 mg total) by mouth daily with breakfast.   OMEPRAZOLE (PRILOSEC) 20 MG CAPSULE    Take 20 mg by mouth daily. Take 1 capsule by mouth 30 minutes before first meal of the day   POTASSIUM CHLORIDE SA (K-DUR) 20 MEQ TABLET    Take 10 mEq by mouth daily.    PRAVASTATIN (PRAVACHOL) 40 MG TABLET    TAKE 1 TABLET ONCE DAILY.  Modified Medications   Modified Medication Previous Medication   CARVEDILOL (COREG) 25 MG TABLET carvedilol (COREG) 25 MG tablet      TAKE (1/2) TABLET TWICE DAILY.    TAKE (1/2) TABLET TWICE DAILY.   FUROSEMIDE (LASIX) 40 MG TABLET furosemide (LASIX) 40 MG tablet      1 daily--alternate with 1.5 every other  day    1 daily--alternate with 1.5 every other day   HYDROCODONE-ACETAMINOPHEN (NORCO/VICODIN) 5-325 MG PER TABLET HYDROcodone-acetaminophen (NORCO/VICODIN) 5-325 MG per tablet      Take a 1/2 to 1 tablet by mouth three times daily as needed for pain.   NOT TO EXCEED 3 PER DAY.    Take a 1/2 to 1 tablet by mouth three times daily as needed for pain.   NOT TO EXCEED 3 PER DAY.  Discontinued Medications   CLOBETASOL PROP CREA-COAL TAR 0.05 & 2.3  % KIT    Apply as directed

## 2013-10-01 NOTE — Patient Instructions (Signed)
Today we updated your med list in our EPIC system...    Continue your current medications the same...  We refilled the meds you requested...  We wrote a new prescription for Lotrisone cream to apply to the groin rash twice daily...  Today we did follow up blood work...    We will contact you w/ the results when available...   Tough family decisions ahead regarding your ability to continue home care vs NHP due to her increased care needs at this point...  Let me know if i can be of service in any way.Marland KitchenMarland Kitchen

## 2013-10-09 ENCOUNTER — Telehealth: Payer: Self-pay | Admitting: Pulmonary Disease

## 2013-10-09 MED ORDER — METFORMIN HCL ER 500 MG PO TB24: 500.0000 mg | ORAL_TABLET | Freq: Two times a day (BID) | ORAL | Status: DC

## 2013-10-09 NOTE — Telephone Encounter (Signed)
Per 10.28.14 labs: BS 119, a1c 8.1 on metformin 1/d and glipiz 5 bid--recs to increase the metfomin 500 bid #60 Renal insuff stable with creat= 1.6, recs same meds increase fluids Lft's, cbc, tsh, bnp all wnl--cont the same  Verified in pt's chart, metformin increase not sent to pharmacy Discussed with Leigh the lab results and recommendations per SN Called spoke with [facility], apologized for the inconvenience and advised that rx will be sent to [facility] Rx sent Nothing further needed; will sign off.

## 2013-10-17 ENCOUNTER — Emergency Department (HOSPITAL_COMMUNITY)
Admission: EM | Admit: 2013-10-17 | Discharge: 2013-10-17 | Disposition: A | Discharge status: Home / Self Care | Payer: Medicare Other | Attending: Emergency Medicine | Admitting: Emergency Medicine

## 2013-10-17 ENCOUNTER — Emergency Department (HOSPITAL_COMMUNITY): Payer: Medicare Other

## 2013-10-17 ENCOUNTER — Encounter (HOSPITAL_COMMUNITY): Payer: Self-pay | Admitting: Emergency Medicine

## 2013-10-17 DIAGNOSIS — H3530 Unspecified macular degeneration: Secondary | ICD-10-CM | POA: Insufficient documentation

## 2013-10-17 DIAGNOSIS — E7800 Pure hypercholesterolemia, unspecified: Secondary | ICD-10-CM | POA: Insufficient documentation

## 2013-10-17 DIAGNOSIS — Z7982 Long term (current) use of aspirin: Secondary | ICD-10-CM | POA: Insufficient documentation

## 2013-10-17 DIAGNOSIS — Y9389 Activity, other specified: Secondary | ICD-10-CM | POA: Insufficient documentation

## 2013-10-17 DIAGNOSIS — M1990 Unspecified osteoarthritis, unspecified site: Secondary | ICD-10-CM | POA: Insufficient documentation

## 2013-10-17 DIAGNOSIS — I872 Venous insufficiency (chronic) (peripheral): Secondary | ICD-10-CM | POA: Insufficient documentation

## 2013-10-17 DIAGNOSIS — Z87448 Personal history of other diseases of urinary system: Secondary | ICD-10-CM | POA: Insufficient documentation

## 2013-10-17 DIAGNOSIS — Y92009 Unspecified place in unspecified non-institutional (private) residence as the place of occurrence of the external cause: Secondary | ICD-10-CM | POA: Insufficient documentation

## 2013-10-17 DIAGNOSIS — I4891 Unspecified atrial fibrillation: Secondary | ICD-10-CM | POA: Insufficient documentation

## 2013-10-17 DIAGNOSIS — S8990XA Unspecified injury of unspecified lower leg, initial encounter: Secondary | ICD-10-CM | POA: Insufficient documentation

## 2013-10-17 DIAGNOSIS — E559 Vitamin D deficiency, unspecified: Secondary | ICD-10-CM | POA: Insufficient documentation

## 2013-10-17 DIAGNOSIS — Z888 Allergy status to other drugs, medicaments and biological substances status: Secondary | ICD-10-CM | POA: Insufficient documentation

## 2013-10-17 DIAGNOSIS — I509 Heart failure, unspecified: Secondary | ICD-10-CM | POA: Insufficient documentation

## 2013-10-17 DIAGNOSIS — K219 Gastro-esophageal reflux disease without esophagitis: Secondary | ICD-10-CM | POA: Insufficient documentation

## 2013-10-17 DIAGNOSIS — W19XXXA Unspecified fall, initial encounter: Secondary | ICD-10-CM

## 2013-10-17 DIAGNOSIS — R4182 Altered mental status, unspecified: Secondary | ICD-10-CM | POA: Insufficient documentation

## 2013-10-17 DIAGNOSIS — F411 Generalized anxiety disorder: Secondary | ICD-10-CM | POA: Insufficient documentation

## 2013-10-17 DIAGNOSIS — E119 Type 2 diabetes mellitus without complications: Secondary | ICD-10-CM | POA: Insufficient documentation

## 2013-10-17 DIAGNOSIS — Z79899 Other long term (current) drug therapy: Secondary | ICD-10-CM | POA: Insufficient documentation

## 2013-10-17 DIAGNOSIS — I10 Essential (primary) hypertension: Secondary | ICD-10-CM | POA: Insufficient documentation

## 2013-10-17 DIAGNOSIS — R911 Solitary pulmonary nodule: Secondary | ICD-10-CM | POA: Insufficient documentation

## 2013-10-17 DIAGNOSIS — W050XXA Fall from non-moving wheelchair, initial encounter: Secondary | ICD-10-CM | POA: Insufficient documentation

## 2013-10-17 DIAGNOSIS — F05 Delirium due to known physiological condition: Secondary | ICD-10-CM | POA: Insufficient documentation

## 2013-10-17 DIAGNOSIS — M25562 Pain in left knee: Secondary | ICD-10-CM

## 2013-10-17 LAB — URINALYSIS, ROUTINE W REFLEX MICROSCOPIC
Bilirubin Urine: NEGATIVE
Glucose, UA: NEGATIVE mg/dL
Hgb urine dipstick: NEGATIVE
Ketones, ur: NEGATIVE mg/dL
Leukocytes, UA: NEGATIVE
Nitrite: NEGATIVE
Protein, ur: NEGATIVE mg/dL
Specific Gravity, Urine: 1.009 (ref 1.005–1.030)
pH: 5 (ref 5.0–8.0)

## 2013-10-17 LAB — COMPREHENSIVE METABOLIC PANEL
ALT: 20 U/L (ref 0–35)
Albumin: 3.1 g/dL — ABNORMAL LOW (ref 3.5–5.2)
Calcium: 9.3 mg/dL (ref 8.4–10.5)
GFR calc Af Amer: 23 mL/min — ABNORMAL LOW (ref >90)
Glucose, Bld: 258 mg/dL — ABNORMAL HIGH (ref 70–99)
Sodium: 136 mEq/L (ref 135–145)
Total Protein: 7.3 g/dL (ref 6.0–8.3)

## 2013-10-17 LAB — CBC WITH DIFFERENTIAL/PLATELET
Basophils Relative: 1 % (ref 0–1)
Eosinophils Absolute: 0.1 10*3/uL (ref 0.0–0.7)
Eosinophils Relative: 2 % (ref 0–5)
HCT: 44.6 % (ref 36.0–46.0)
Lymphs Abs: 1.8 10*3/uL (ref 0.7–4.0)
MCH: 31.8 pg (ref 26.0–34.0)
MCHC: 33.6 g/dL (ref 30.0–36.0)
MCV: 94.5 fL (ref 78.0–100.0)
Monocytes Absolute: 0.6 10*3/uL (ref 0.1–1.0)
Neutrophils Relative %: 62 % (ref 43–77)
Platelets: 136 10*3/uL — ABNORMAL LOW (ref 150–400)
RBC: 4.72 MIL/uL (ref 3.87–5.11)
RDW: 13.9 % (ref 11.5–15.5)

## 2013-10-17 LAB — CG4 I-STAT (LACTIC ACID): Lactic Acid, Venous: 2.76 mmol/L — ABNORMAL HIGH (ref 0.5–2.2)

## 2013-10-17 MED ORDER — SODIUM CHLORIDE 0.9 % IV BOLUS (SEPSIS)
500.0000 mL | Freq: Once | INTRAVENOUS | Status: AC
  Administered 2013-10-17: 500 mL via INTRAVENOUS

## 2013-10-17 MED ORDER — CARVEDILOL 12.5 MG PO TABS
12.5000 mg | ORAL_TABLET | Freq: Once | ORAL | Status: AC
  Administered 2013-10-17: 12.5 mg via ORAL
  Filled 2013-10-17: qty 1

## 2013-10-17 NOTE — ED Provider Notes (Signed)
CSN: [id number]     Arrival date & time 10/17/13  1029 History   First MD Initiated Contact with Patient 10/17/13 1030     Chief Complaint  Patient presents with  . Fall   (Consider location/radiation/quality/duration/timing/severity/associated sxs/prior Treatment) The history is provided by the patient and a caregiver. No language interpreter was used.   Patient is [name] with past medical history of atrial fibrillation and degenerative joint disease comes emergency department today after a fall. She's been having increasing confusion over the past 2-3 weeks family states that she has begun forgetting why she is doing things. She has become increasingly more difficult to transfer in the house secondary to her knee pain. Day her family was transferred her from chair to wheelchair when her left knee gave out and she fell to the floor. Her family were at her to the floor.  She did not impact her head and suffered no injury. This concerned that she may need physical therapy for her left knee as result of emergency department. She rates the pain to her knee at an 8/10. She denies any other pain.  Past Medical History  Diagnosis Date  . Macular degeneration (senile) of retina, unspecified   . Pulmonary nodule   . Unspecified essential hypertension   . Congestive heart failure, unspecified   . Atrial fibrillation   . Unspecified venous (peripheral) insufficiency   . Pure hypercholesterolemia   . GERD (gastroesophageal reflux disease)   . Personal history of unspecified urinary disorder   . DJD (degenerative joint disease)   . Unspecified vitamin D deficiency   . Abnormality of gait   . Anxiety state, unspecified   . Type II or unspecified type diabetes mellitus without mention of complication, not stated as uncontrolled    Past Surgical History  Procedure Laterality Date  . Cholecystectomy  6/07    Dr. Corliss Skains  . Total abdominal hysterectomy    . Bladder surgery     . Total knee arthroplasty  1990    right - Dr. Darrelyn Hillock   Family History  Problem Relation Age of Onset  . Rheum arthritis Father   . Breast cancer Sister   . Liver cancer Mother   . Heart attack Father   . Heart disease Sister   . Heart disease Sister   . Diabetes Mother    History  Substance Use Topics  . Smoking status: Never Smoker   . Smokeless tobacco: Never Used  . Alcohol Use: No   OB History   Grav Para Term Preterm Abortions TAB SAB Ect Mult Living                 Review of Systems  Constitutional: Negative for fever and chills.  Respiratory: Negative for cough and shortness of breath.   Gastrointestinal: Negative for nausea, vomiting, diarrhea and constipation.  Genitourinary: Negative for dysuria, urgency and frequency.  Musculoskeletal: Positive for arthralgias (left knee). Negative for gait problem and myalgias.  Skin: Negative for color change and wound.  Neurological: Negative for weakness and numbness.  Psychiatric/Behavioral: Positive for confusion. Negative for agitation.  All other systems reviewed and are negative.    Allergies  Celecoxib and Diclofenac sodium  Home Medications   Current Outpatient Rx  Name  Route  Sig  Dispense  Refill  . acetaminophen (TYLENOL) 325 MG tablet   Oral   Take 650 mg by mouth once. as needed - alternates this with hydrocodone         .  aspirin 325 MG tablet   Oral   Take by mouth daily.          . carvedilol (COREG) 25 MG tablet   Oral   Take 12.5 mg by mouth every morning. TAKE (1/2) TABLET TWICE DAILY.         Marland Kitchen Cholecalciferol (VITAMIN D) 2000 UNITS CAPS   Oral   Take 2,000 Units by mouth daily. 1 capsule by mouth once daily         . clobetasol cream (TEMOVATE) 0.05 %   Topical   Apply 1 application topically 2 (two) times daily.         . Clobetasol Prop Oint-Coal Tar 0.05 & 2.3 % KIT   Apply externally   Apply 1 application topically daily as needed.         .  clotrimazole-betamethasone (LOTRISONE) cream      Apply to rash two times daily         . furosemide (LASIX) 40 MG tablet   Oral   Take 40-60 mg by mouth daily. 1 daily--alternate with 1.5 every other day         . gabapentin (NEURONTIN) 300 MG capsule   Oral   Take 600 mg by mouth at bedtime.         Marland Kitchen glipiZIDE (GLUCOTROL XL) 5 MG 24 hr tablet   Oral   Take 5 mg by mouth 2 (two) times daily.         Marland Kitchen HYDROcodone-acetaminophen (NORCO/VICODIN) 5-325 MG per tablet   Oral   Take 0.5-1 tablets by mouth 3 (three) times daily as needed. Take a 1/2 to 1 tablet by mouth three times daily as needed for pain.   NOT TO EXCEED 3 PER DAY.         Marland Kitchen losartan (COZAAR) 50 MG tablet   Oral   Take 50 mg by mouth daily.         . metFORMIN (GLUCOPHAGE-XR) 500 MG 24 hr tablet   Oral   Take 500 mg by mouth every morning.         Marland Kitchen omeprazole (PRILOSEC) 20 MG capsule   Oral   Take 20 mg by mouth daily. Take 1 capsule by mouth 30 minutes before first meal of the day         . potassium chloride SA (K-DUR) 20 MEQ tablet   Oral   Take 10 mEq by mouth daily.          . pravastatin (PRAVACHOL) 40 MG tablet   Oral   Take 40 mg by mouth daily.          BP 181/76  Pulse 81  Temp(Src) 97.4 F (36.3 C) (Oral)  Resp 18  SpO2 96% Physical Exam  Nursing note and vitals reviewed. Constitutional: She is oriented to person, place, and time. She appears well-developed and well-nourished. No distress.  HENT:  Head: Normocephalic and atraumatic.  Eyes: Pupils are equal, round, and reactive to light.  Neck: Normal range of motion.  Cardiovascular: Normal rate, regular rhythm, normal heart sounds and intact distal pulses.   Pulmonary/Chest: Effort normal. No respiratory distress. She has no wheezes. She exhibits no tenderness.  Abdominal: Soft. Bowel sounds are normal. She exhibits no distension. There is no tenderness. There is no rebound and no guarding.  Musculoskeletal:        Left knee: She exhibits normal range of motion, no swelling, no effusion, no ecchymosis, no deformity, normal alignment, no LCL  laxity, no bony tenderness, normal meniscus and no MCL laxity. Tenderness found.  Neurological: She is alert and oriented to person, place, and time. She has normal strength. No cranial nerve deficit or sensory deficit. She exhibits normal muscle tone. Coordination and gait normal.  Strength 5/5 bilateral upper and lower extremities.  Sensation intact x4 extremities.    Skin: Skin is warm and dry.    ED Course  Procedures (including critical care time) Labs Review Labs Reviewed  CBC WITH DIFFERENTIAL - Abnormal; Notable for the following:    Platelets 136 (*)    All other components within normal limits  COMPREHENSIVE METABOLIC PANEL - Abnormal; Notable for the following:    Glucose, Bld 258 (*)    BUN 52 (*)    Creatinine, Ser 1.97 (*)    Albumin 3.1 (*)    GFR calc non Af Amer 20 (*)    GFR calc Af Amer 23 (*)    All other components within normal limits  CG4 I-STAT (LACTIC ACID) - Abnormal; Notable for the following:    Lactic Acid, Venous 2.76 (*)    All other components within normal limits  URINALYSIS, ROUTINE W REFLEX MICROSCOPIC  TROPONIN I   Imaging Review Dg Chest 2 View  10/17/2013   CLINICAL DATA:  Left hip and knee pain secondary to a fall.  EXAM: CHEST  2 VIEW  COMPARISON:  01/21/2013  FINDINGS: There is borderline cardiomegaly. Pulmonary vascularity is normal and the lungs are clear. No acute osseous abnormality. No effusions.  IMPRESSION: No acute disease.   Electronically Signed   By: Geanie Cooley M.D.   On: 10/17/2013 12:48   Dg Hip Complete Left  10/17/2013   CLINICAL DATA:  One 79 year old female status post fall with pain. Initial encounter.  EXAM: LEFT HIP - COMPLETE 2+ VIEW  COMPARISON:  None.  FINDINGS: Femoral heads are normally located. Grossly intact proximal right femur. Left hemipelvis intact. No acute pelvic fracture  identified. Numerous phleboliths. No acute proximal left femur fracture identified. Calcified atherosclerosis.  IMPRESSION: No acute fracture or dislocation identified about the left hip or pelvis. If occult hip fracture is suspected or if the patient is unable to weightbear, MRI is the preferred modality for further evaluation.   Electronically Signed   By: Augusto Gamble M.D.   On: 10/17/2013 13:06   Ct Head Wo Contrast  10/17/2013   CLINICAL DATA:  One 79 year old female status post fall. Altered mental status and decreased mobility.  EXAM: CT HEAD WITHOUT CONTRAST  TECHNIQUE: Contiguous axial images were obtained from the base of the skull through the vertex without intravenous contrast.  COMPARISON:  01/24/2012 and earlier.  FINDINGS: No acute orbit or scalp soft tissue finding identified. Minimal sphenoid and ethmoid sinus mucosal thickening is stable to improved. Other Visualized paranasal sinuses and mastoids are clear. No acute osseous abnormality identified.  Calcified atherosclerosis at the skull base. Cerebral volume not significantly changed. No ventriculomegaly. No midline shift, mass effect, or evidence of intracranial mass lesion. Stable CT appearance of intracranial vascular structures. No suspicious intracranial vascular hyperdensity. No evidence of cortically based acute infarction identified. No acute intracranial hemorrhage identified. Stable mild for age nonspecific white matter hypodensity.  IMPRESSION: No acute intracranial abnormality. Stable non contrast CT appearance of the brain since 2013.   Electronically Signed   By: Augusto Gamble M.D.   On: 10/17/2013 16:31   Dg Knee Complete 4 Views Left  10/17/2013   CLINICAL  DATA:  One 79 year old female status post fall with pain.  EXAM: LEFT KNEE - COMPLETE 4+ VIEW  COMPARISON:  None.  FINDINGS: Osteopenia. Advanced joint space loss at the left knee. Advanced chondrocalcinosis and degenerative spurring. No definite joint effusion. The  patella appears intact on the cross-table lateral view. No acute fracture or dislocation identified. Calcified atherosclerosis.  IMPRESSION: No acute fracture or dislocation identified about the left knee. Advanced degenerative changes.   Electronically Signed   By: Augusto Gamble M.D.   On: 10/17/2013 13:09    EKG Interpretation     Ventricular Rate:  72 PR Interval:    QRS Duration: 93 QT Interval:  378 QTC Calculation: 414 R Axis:   -99 Text Interpretation:  Age not entered, assumed to be  79 years old for purpose of ECG interpretation Atrial fibrillation Left anterior fascicular block Low voltage, extremity and precordial leads .  Unchanged from prior.             MDM  Patient is [name] with past medical history as above who comes emergency department today after fall. Physical exam as above. Patient was not felt to have suffered any injuries from fall result is not felt to require evaluation from the standpoint. The fall was mechanical as result doubt syncope. Initial workup included a UA, lactic acid, CBC, CMP, troponin, EKG, CT of her head, chest x-ray, x-ray of the left hip, negative for any. Extremity demonstrated no fractures as advanced degenerative changes consistent with patient's history. After the chest demonstrated no acute cardiopulmonary abnormality. Doubt pneumonia or pneumothorax. X-ray of the left hip demonstrate no fracture dislocation. With no pain to the head doubt an occult fracture. Troponin was negative. CMP had a creatinine of 1.9 close the patient's baseline. CBC was unremarkable. Lactic acid is 2.7 patient was administered 500 mL normal saline bolus. UA was negative. CT of the head demonstrated no acute abnormalities. Patient's family has been having a hard time caring for her at home recently. There felt to require assistance at home.  Case management was consulted and they recommended the patient be set up with home health. Home health was  ordered. He was evaluated by physical therapy emergency department was able to ambulate with minimal assistance. They felt that the patient was safe for trial of home health physical therapy.  Patient was felt to be stable for discharge. Patient was discharged in stable condition. He was instructed to followup with her primary care physician within the next week. They're instructed to return to the emergency department if patient receives any other falls, has worsening confusion, fevers, chills, or any other concerns. Family expressed understanding. Labs and imaging reviewed by myself and considered and medical decision-making. Imaging was interpreted radiology. Care was discussed with attending Dr. Micheline Maze.    1. Fall from standing, initial encounter   2. Knee pain, left       Bethann Berkshire, MD 10/17/13 [phone]

## 2013-10-17 NOTE — Progress Notes (Signed)
   CARE MANAGEMENT ED NOTE 10/17/2013  Patient:  MARNEE, SHERRARD   Account Number:  [id number]  Date Initiated:  10/17/2013  Documentation initiated by:  Michel Bickers  Subjective/Objective Assessment:   S/P Fall/ AMS     Subjective/Objective Assessment Detail:     Action/Plan:   Action/Plan Detail:   Anticipated DC Date:  10/17/2013     Status Recommendation to Physician:   Result of Recommendation:  Agreed  Other ED Services  Appropriate Status Consult    DC Planning Services  CM consult   [facility]  HOME HEALTH  DURABLE MEDICAL EQUIPMENT   Choice offered to / List presented to:  C-2 HC POA / Guardian  DME arranged  HOSPITAL BED     DME agency  [facility]   HH arranged  HH-1 RN  HH-2 PT  HH-3 OT  HH-4 NURSE'S AIDE  HH-6 SOCIAL WORKER      HH agency  [facility]    Status of service:  Completed, signed off  ED Comments:   ED Comments Detail:  10/17/13 1615 W. Norrine Ballester RN BSN [phone] Referral placed with Derrian at [facility] for bed. Family made aware that someone will call to arrange delivery time. Pt and family made aware that [facility] will contact them with number verified in record with 24 - 48 hors to set up [facility] services. Family given [facility] contact number if any questions should arise. Pt will be discharge home via [facility] at family's request. No further CM needs identified.    10/17/13 1440 W. Laakea Pereira RN BSN [phone]  U/A result neg., CT normal no acute changes.   ED CM speaking with family regarding disposition plan with  HH, family verbalized understanding and is agreeable with plan. [facility] Agency choice given. Decided upon [facility], services used in the past.Referral was called in to [facility] for RN,PT,OT,SW, and HHA. PT eval ordered in ED to determine baseline. Patient and family requesting hospital bed. Pt immobile, and left knee elevate without assistance. Pt has a walker, w/c, 3:1 chair at home. Discussed dispo plan with Tammy Sours RN and Dr  Micheline Maze team in agreement.  10/17/13 1345 W. Aundria Rud RN BSN [phone] ED CM consulted by Arline Asp CM to f/u on dsipostion plan for patient. pt presented to ED with s/p fall and AMS. In to speak with patient and family at bedside. Terance Ice daughter HCPOA at bedside, sthe states that her Mom has become  increasingly weak and  confused over the last couple of weeks. She roports today her knees buckled and mom fell to the ground and they were not able to lift her,and EMS was called.  She reports that she is having a difficult time trying care for mom due to herpersonal physical health. Pt lives with elderly daughter in 2 bedroom condo here in [place]. U/A pending, CT pending, awaiting final disposition plan. CM will continue to follow.

## 2013-10-17 NOTE — Evaluation (Signed)
Physical Therapy Evaluation Patient Details Name: LOWELL MCGURK MRN: [id number] DOB: 1911-04-21 Today's Date: 10/17/2013 Time: 1500-1530 PT Time Calculation (min): 30 min  PT Assessment / Plan / Recommendation History of Present Illness  patient admitted to ED with history of declining independence with mobility over past 2 weeks, most significantly in last week.  Patient with fall this morning and brought to ED by EMS.  Clinical Impression  Patient presents with dependencies in mobility, requiring physical assistance for mobilty.  Family is unsure if they are able to provide necessary assistance.  Do feel patient will benefit from F/U PT to address generalized weakness, recent fall and progressive mobility.      PT Assessment  Patient needs continued PT services    Follow Up Recommendations  Home health PT (will need SNF if family unable to provide physical assistance)    Does the patient have the potential to tolerate intense rehabilitation      Barriers to Discharge Decreased caregiver support unsure if family can provide support required.  Patient does need physical assistance for mobility.      Equipment Recommendations  None recommended by PT    Recommendations for Other Services     Frequency      Precautions / Restrictions Precautions Precautions: Fall Restrictions Weight Bearing Restrictions: No   Pertinent Vitals/Pain No pain.  Patient's HR did increase during mobility.       Mobility  Bed Mobility Bed Mobility: Supine to Sit;Sit to Supine Supine to Sit: 4: Min guard;HOB elevated Sit to Supine: 4: Min assist;HOB elevated Details for Bed Mobility Assistance: able to move legs on and off bed.  somewhat difficult because patient on ED stretcher versus normal bed.   Transfers Transfers: Sit to Stand;Stand to Sit;Stand Pivot Transfers Sit to Stand: 4: Min assist;From elevated surface;From bed;With upper extremity assist;From chair/3-in-1 Stand to Sit: 4: Min  assist;With upper extremity assist;To chair/3-in-1;To bed Stand Pivot Transfers: 4: Min assist Details for Transfer Assistance: required physical assistance for sit to stand and max cueing for safety (to wait for therapist for proper guarding); patient performed stand pivot transfer with RW from bed to chair and back.   Ambulation/Gait Ambulation/Gait Assistance Details: patient did not want to attempt ambulation due to "she was wet"; also limited by lines/tubes.    Exercises     PT Diagnosis:    PT Problem List: Decreased activity tolerance;Decreased balance;Decreased mobility;Decreased safety awareness;Cardiopulmonary status limiting activity PT Treatment Interventions:       PT Goals(Current goals can be found in the care plan section) Acute Rehab PT Goals PT Goal Formulation:  (no goals set, patient to d/c from ED)  Visit Information  Last PT Received On: 10/17/13 Assistance Needed: +1 History of Present Illness: patient admitted to ED with history of declining independence with mobility over past 2 weeks, most significantly in last week.  Patient with fall this morning and brought to ED by EMS.       Prior Functioning  Home Living Family/patient expects to be discharged to:: Private residence Living Arrangements: Children Available Help at Discharge: Family;Available 24 hours/day Type of Home: Apartment Home Access: Elevator Home Layout: One level Home Equipment: Walker - 2 wheels;Bedside commode;Shower seat Prior Function Level of Independence: Needs assistance Gait / Transfers Assistance Needed: needs assistance with all aspects of mobility, more so recently ADL's / Homemaking Assistance Needed: family assists with meals, cleaning, etc Communication Communication: HOH    Cognition  Cognition Arousal/Alertness: Awake/alert Behavior During Therapy: WFL for  tasks assessed/performed Overall Cognitive Status: Impaired/Different from baseline Area of Impairment:  Memory;Safety/judgement Memory: Decreased short-term memory Safety/Judgement: Decreased awareness of safety General Comments: attempted to stand up on own when I asked her wait    Extremity/Trunk Assessment Upper Extremity Assessment Upper Extremity Assessment: Overall WFL for tasks assessed Lower Extremity Assessment Lower Extremity Assessment: Overall WFL for tasks assessed Cervical / Trunk Assessment Cervical / Trunk Assessment: Kyphotic   Balance Balance Balance Assessed: Yes Static Sitting Balance Static Sitting - Balance Support: Bilateral upper extremity supported Static Sitting - Level of Assistance: 5: Stand by assistance Static Standing Balance Static Standing - Balance Support: Bilateral upper extremity supported Static Standing - Level of Assistance: 4: Min assist  End of Session PT - End of Session Equipment Utilized During Treatment: Gait belt Activity Tolerance: Patient tolerated treatment well Patient left: in bed;with family/visitor present Nurse Communication: Mobility status  GP Functional Assessment Tool Used: clinical judgement Functional Limitation: Mobility: Walking and moving around Mobility: Walking and Moving Around Current Status [phone]): At least 20 percent but less than 40 percent impaired, limited or restricted Mobility: Walking and Moving Around Goal Status [phone]): At least 20 percent but less than 40 percent impaired, limited or restricted Mobility: Walking and Moving Around Discharge Status [phone]): At least 20 percent but less than 40 percent impaired, limited or restricted   Olivia Canter, [place] [phone] 10/17/2013, 3:57 PM

## 2013-10-17 NOTE — ED Notes (Signed)
Doctor notified of Blood pressure.

## 2013-10-17 NOTE — ED Notes (Signed)
Onset today patient slipped out of recliner family unable to pick up patient called EMS.  Family stated patient having decreased mobility due for weeks.  Here for evaluation.  Patient has no complaints at this time per EMS.

## 2013-10-17 NOTE — ED Notes (Signed)
PT at bedside.

## 2013-10-17 NOTE — ED Notes (Signed)
[facility] contacted for transport 

## 2013-10-17 NOTE — ED Notes (Signed)
Case Manager at bedside

## 2013-10-18 ENCOUNTER — Telehealth: Payer: Self-pay | Admitting: Pulmonary Disease

## 2013-10-18 MED ORDER — NYSTATIN 100000 UNIT/GM EX POWD: CUTANEOUS | Status: DC

## 2013-10-18 NOTE — Telephone Encounter (Signed)
Pt's daughter states that the pt still has a yeast infection. Pt is very comfortable. Would like a powder be called in to help keep the pt dry.  SN - please advise. Thanks.

## 2013-10-18 NOTE — Telephone Encounter (Signed)
I spoke with daughter and is aware of rec.s RX has been sent.

## 2013-10-18 NOTE — Telephone Encounter (Signed)
Per SN he is fine the with the clotrimazole powder. Thanks

## 2013-10-18 NOTE — ED Provider Notes (Signed)
Medical screening examination/treatment/procedure(s) were conducted as a shared visit with resident-physician practitioner(s) and myself.  I personally evaluated the patient during the encounter.  Pt is a 79 y.o. female with pmhx as above presenting with slow fall and L knee pain.  Knee pain present well before fall, and been told she would need knee replacement if not for advanced age. On my PE, pt pleasant, in NAD, without complaint, though does have localized ttp of L knee and pain w/ ROM w/o signs of external trauma or infection.  W/U showed no bony trauma, CT head nml (doubt CVA/TIA as cause of fall), and acute e-lyte disturbance.  Social work saw pt, recommended PT eval which was done.  Home health, home PT will be initiated.  Pt will follow closely with PCP, family considering advanced placement.    EKG Interpretation     Ventricular Rate:  72 PR Interval:    QRS Duration: 93 QT Interval:  378 QTC Calculation: 414 R Axis:   -99 Text Interpretation:  Age not entered, assumed to be  79 years old for purpose of ECG interpretation Atrial fibrillation Left anterior fascicular block Low voltage, extremity and precordial leads .  Unchanged from prior.               Shanna Cisco, MD 10/18/13 [phone]

## 2013-10-18 NOTE — Telephone Encounter (Signed)
Daughter returning call

## 2013-10-18 NOTE — Telephone Encounter (Signed)
Spoke with [name] with [facility].  There is not a clotrimazole powder.  She would rec we send in rx for nystatin powder for 30 g.    Spoke with TD as SN is not in office this pm.  Ok to call in nystatin powder for bid prn # 30 g x 1.  I have sent rx in.  lmomtcb for pt's daughter to let her know.

## 2013-10-22 ENCOUNTER — Ambulatory Visit: Payer: Self-pay

## 2013-10-22 ENCOUNTER — Telehealth: Payer: Self-pay | Admitting: Pulmonary Disease

## 2013-10-22 DIAGNOSIS — S8992XA Unspecified injury of left lower leg, initial encounter: Secondary | ICD-10-CM

## 2013-10-22 NOTE — Telephone Encounter (Signed)
Order placed on pt. Daughter aware. Nothing further needed

## 2013-10-22 NOTE — Telephone Encounter (Signed)
Per SN---  Ok to send in order for hospital bed with split rails.  They will need this from [facility].  thanks

## 2013-10-22 NOTE — Telephone Encounter (Signed)
I called and spoke with pt daughter. She reports pt was seen in the ED 10/17/13 d/t fall. From the hospital pt was ordered a hospital bed with full rails for the home. [facility] brought this out to pt home. She reports since it has the full rails they are having a really hard time getting pt in and out of the bed. They are askig fur [place] to send an order for a hospital bed that has split rails. They called [facility] and was advised they can switch bed out but needs an order. Please advise SN thanks

## 2013-10-23 ENCOUNTER — Telehealth: Payer: Self-pay | Admitting: Pulmonary Disease

## 2013-10-23 ENCOUNTER — Other Ambulatory Visit: Payer: Self-pay | Admitting: Pulmonary Disease

## 2013-10-24 NOTE — Telephone Encounter (Signed)
FL2 form has been completed by SN and i have called the pts daughter [name] and she is aware these are completed and she will come by today to pick these up.  I have made a copy of the FL2 and placed in the scan folder.

## 2013-10-29 ENCOUNTER — Ambulatory Visit: Payer: Medicare Other | Admitting: Pulmonary Disease

## 2013-11-04 ENCOUNTER — Telehealth: Payer: Self-pay | Admitting: Pulmonary Disease

## 2013-11-04 MED ORDER — HYDROCODONE-ACETAMINOPHEN 5-325 MG PO TABS: ORAL_TABLET | ORAL | Status: DC

## 2013-11-04 NOTE — Telephone Encounter (Signed)
Per SN---  Ok to refill the rx for the hydrocodone.  This has been printed out and placed on SN cart to be signed.  thanks

## 2013-11-04 NOTE — Telephone Encounter (Signed)
rx has been left up front front to be picked up.  i have called wilma and she is aware and she will come by either today or tomorrow to pick this up.

## 2013-11-04 NOTE — Telephone Encounter (Signed)
I called [facility] and was advised pt last norco refilled on 10/03/13. Please advise SN thanks

## 2013-11-06 ENCOUNTER — Telehealth: Payer: Self-pay | Admitting: Pulmonary Disease

## 2013-11-06 MED ORDER — FLUCONAZOLE 100 MG PO TABS: 100.0000 mg | ORAL_TABLET | Freq: Every day | ORAL | Status: DC

## 2013-11-06 NOTE — Telephone Encounter (Signed)
Diflucan rx was sent to [facility]. I left detail msg on Trisha's named VM and asked she call back in the morning to let us know she received the msg.

## 2013-11-06 NOTE — Telephone Encounter (Signed)
I called and spoke with Bethann Berkshire (pt nurse). She reports pt has a vaignal yeast infection and is requesting diflucan. Also pt has some swelling in the ankles bilaterally. Pt alternates lasix 40 mg 1 tab - 1 1/2 tab QOD. Wants to know if we should increase her lasix or leave this the same. Please advise SN thanks --[facility] Allergies  Allergen Reactions  . Celecoxib     GI discomfort  . Diclofenac Sodium     REACTION: causing burining sensation after use

## 2013-11-06 NOTE — Telephone Encounter (Signed)
Per SN---  Diflucan 100 mg  #7  1 daily with no refills.  Keep the pt off of her feet and keep feet elevated and no salt in her diet. We are not able to go up on her lasix due to her kidney function.  thanks

## 2013-11-07 NOTE — Telephone Encounter (Signed)
Spoke with Bethann Berkshire ans she advised the pt daughter. Carron Curie, CMA

## 2013-11-11 ENCOUNTER — Telehealth: Payer: Self-pay | Admitting: Pulmonary Disease

## 2013-11-11 NOTE — Telephone Encounter (Signed)
ATC daughter NA [facility]

## 2013-11-12 NOTE — Telephone Encounter (Signed)
I called and spoke with daughter. She reports pt has serious mobility problems now and it is hard to get patient anywhere. She doesn't even think she could get patient here at all but she made an appt tomorrow at 11:30 with TP. She thinks an [facility] nurse is suppose to come and could possibly do a UA?   I called and per TD this is fine. I called and made daughter aware. She will let us know tomorrow. Nothing further needed

## 2013-11-12 NOTE — Telephone Encounter (Signed)
Please add pt on with TP for tomorrow.  May need ua and urine culture.

## 2013-11-12 NOTE — Telephone Encounter (Signed)
Last OV 10-01-13. Spoke with the pt daughter and she states the pt is having frequent urination. She states they are having to change her depends very frequesntly and they have even been placing a pad in the depends as well but she is still soaking through them. She also states there is a very foul odor to her urine. She states the pt denies any pain with urination, no fever. She has been having some increased confusion as well. [facility] nurse is set to come out today to see the patient. Please advise. Carron Curie, CMA Allergies  Allergen Reactions  . Celecoxib     GI discomfort  . Diclofenac Sodium     REACTION: causing burining sensation after use

## 2013-11-13 ENCOUNTER — Encounter: Payer: Self-pay | Admitting: Adult Health

## 2013-11-13 ENCOUNTER — Other Ambulatory Visit (INDEPENDENT_AMBULATORY_CARE_PROVIDER_SITE_OTHER): Payer: Medicare Other

## 2013-11-13 ENCOUNTER — Ambulatory Visit (INDEPENDENT_AMBULATORY_CARE_PROVIDER_SITE_OTHER): Payer: Medicare Other | Admitting: Adult Health

## 2013-11-13 VITALS — BP 134/82 | HR 82 | Temp 97.5°F | Ht 63.0 in | Wt 183.6 lb

## 2013-11-13 DIAGNOSIS — N390 Urinary tract infection, site not specified: Secondary | ICD-10-CM

## 2013-11-13 LAB — URINALYSIS, ROUTINE W REFLEX MICROSCOPIC
Bilirubin Urine: NEGATIVE
Ketones, ur: NEGATIVE
Nitrite: NEGATIVE
Specific Gravity, Urine: 1.01 (ref 1.000–1.030)
Total Protein, Urine: NEGATIVE
pH: 7 (ref 5.0–8.0)

## 2013-11-13 MED ORDER — CIPROFLOXACIN HCL 250 MG PO TABS: 250.0000 mg | ORAL_TABLET | Freq: Two times a day (BID) | ORAL | Status: DC

## 2013-11-13 NOTE — Patient Instructions (Signed)
Cipro 250mg  Twice daily  For  7 days  Fluids and rest  Empty bladder often Low salt diet  Keep legs elevated as able Please contact office for sooner follow up if symptoms do not improve or worsen or seek emergency care  follow up Dr. Kriste Basque as planned and As needed

## 2013-11-14 NOTE — Assessment & Plan Note (Signed)
UA + , cx pending   Plan  Cipro 250mg  Twice daily  For  7 days  Fluids and rest  Empty bladder often Low salt diet  Keep legs elevated as able Please contact office for sooner follow up if symptoms do not improve or worsen or seek emergency care  follow up Dr. Kriste Basque as planned and As needed

## 2013-11-14 NOTE — Progress Notes (Signed)
Subjective:    Patient ID: [name], female    DOB: 07-09-1911, 79 y.o.   MRN: [id number]  HPI 79 yo female   11/13/13 Acute OV  Complains of 2 days of urinary urgency , incontinence and polyuria. Last UTI ~6 months ago.  Patient is accompanied by her daughter, who says that her urinary incontinence. Has worsened over the last 2 days. She's used multiple depends. She denies any hematuria, abdominal pain, flank pain, fever. UA today show +WBCs, small hbg, rare bacteria    MACULAR DEGENERATION (ICD-362.50) - on OCUVITES daily... s/p laser surg by DrRankin... she cannot see well enough to read... they tried Bilberry Juice as heard on the [facility]- it's helping... ~  3/10: had f/u w/ DrRankin- stabilized... nothing else he can do... f/u 14yr. ~  3/11:  had f/u DrRankin> senile macular degen OU (worsening), vitreous detach OS (stable), bilat Drusen...    PULMONARY NODULE (ICD-518.89) - RUL nodule, eval 2007 w/ bx = granuloma...  ~  Serial CXRs have been stable... ~  CXR 3/11 showed chr changes & calcif granulomas, prob calcif of LAD seen on lat. ~  CXR 5/12 w/ Hosp for LLL infiltrate, improved serially w/ antibiotic rx. ~  CXR 2/13 showed cardiomeg, clear lungs, osteopenia & TSpine degen changes... ~  CXR 2/14 showed stable heart size, clear lungs, osteopenia in bones, NAD...  HYPERTENSION (ICD-401.9) - she takes ASA 325mg /d, COREG25mg - 1/2Bid, LOSARTAN50mg /d, & LASIX40mg /d, K20 added 1/2 per day... ~  9/11:  she received letter from [facility] stating she should be on ACE or ARB Rx> add Losartan 25mg /d... ~  5/12:  Hosp by [facility] w/ LLL pneumonia, UTI, CHF/ DD & meds were changed/ adjusted> Losartan & Atenolol stopped, Coreg added, Lasix decreased KCl added. ~  7/12:  1st post hosp (5/12) visit & meds reviewed & reconciled ~  11/12: On Coreg3.125Bid, Losartan25, Lasix20, K20; BP= 124/80 & she denies HA, CP, palpit, ch in SOB or edema... ~  5/13: On Coreg12.5Bid, Lasix20, K10; BP= 140/98  & wil will restart he LOSARTAN50... ~  8/13:  On Coreg12,5Bid, Losartan50, Lasix40, K20-1/2> BP= 122/72 & she denies CP, palpit, ch in SOB or edema... ~  2/14: on Coreg25-1/2Bid, Losartan50, Lasix40mg /d, K20; BP= 126/88 & she denies HA, CP, palpit, ch in SOB... ~  5/14: on Coreg25-1/2Bid, Losartan50, Lasix40 alt w/ 60 qod, K20-1/2; BP= 138/82 & she denies direct BP related symptoms... ~  8/14: on Coreg25-1/2Bid, Losartan50, Lasix40 alt w/ 60 qod, K20-1/2; BP= 126/80 & stable...  CONGESTIVE HEART FAILURE (ICD-428.0) - on above meds... ~  2DEcho 6/06 showed biatrial enlargement, mild MR/TR, norm LVF... ~  labs 3/11 showed BNP= 189... ~  Labs from 5/12 hosp reviewed in [place]... ~  Labs from 2/13 hosp reviewed in Epic... ~  Labs 2/14 showed BNP=187, and Creat=1.4...  ATRIAL FIBRILLATION, CHRONIC (ICD-427.31) - prev on Coumadin, pt & family decided to stop Coumadin in 2007, & now on ASA daily... rate control strategy w/ COREG currently...  VENOUS INSUFFICIENCY (ICD-459.81) - on low sodium diet, and Lasix... ~  2/14: wt up 13# to 186# assoc w/ incr LE edema R>L; we reviewed need for salt restriction, elev legs, wear support hose, continue Lasix40 7 ok to take extra 20mg  if swelling doesn't go down overnight...  HYPERCHOLESTEROLEMIA (ICD-272.0) - on PRAVACHOL 40mg Qhs and tol well... ~  FLP 11/08 showed TChol 151, TG 152, HDL 38, LDL 82... > continue med & diet efforts. ~  FLP 3/09 shows TChol  206, TG 332, HDL 40, LDL 97... > cont same, get wt down! ~  FLP 3/10 showed TChol 172, TG 214, HDL 43, LDL 92... > improved- continue same. ~  FLP 3/11 showed TChol 167, TG 236, HDL 46, LDL 83 ~  FLP 9/11 on Prav40 showed TChol 144, TG 212, HDL 36, LDL 72... reminded of low fat diet rec... ~  It is difficult to get her in for FASTING blood work... ~  FLP 5/13 on Prav40 showed TChol 149, TG 154, HDL 43, LDL 75 ~  FLP 2/14 on Prav40 showed TChol 138, TG 169, HDL37, LDL 67   DM (ICD-250.00) - prev on  Metform500bid, stopped 5/12 Hosp & ch to GLIPIZIDE XL 2.5mg  Qam... ~  labs 11/08 showed FBS=182, HgA1c=7.1... >continue attempts at diet control. ~  labs 3/09 showed  FBS=178, HgA1c=7.2... >she will need to start meds: Metformin ER 500mg Qam. ~  labs 7/09 showed BS= 138, HgA1c= 6.7.Marland KitchenMarland Kitchenrec- keep same.  ~  labs 3/10 (wt=162#) showed 139, HgA1c= 6.6... > continue same meds. ~  labs 9/10 (wt=175#) showed BS= 158, A1c= 6.8.Marland KitchenMarland Kitchen needs better diet. ~  labs 3/11 (wt=179#) showed BS= 155, A1c= 7.6.Marland KitchenMarland Kitchen may need incr meds! get wt down. ~  labs 9/11 (wt=173#) showed BS= 166, A1c= 8.0.Marland KitchenMarland Kitchen worsening A1c- rec incr Metform to 2/d... ~  Urine microalb 9/11 = neg... ~  Labs 5/12 Hosp reviewed in EChart> off Metform, on Glipizide 2.5mg /d... ~  Labs 7/12 in office on Glip2.5 showed BS= 101, A1c= 6.7.Marland KitchenMarland Kitchen rec to continue same. ~  11/12:  daugh states BS at home all ~100 on the Glip2.5 daily; needs to ret for labs & A1c. ~  Labs 5/13 showed BS= 123, A1c= 6.5.Marland KitchenMarland Kitchen Continue same Rx. ~  She reports Podiatric care from DrSikora- hammertoes, trims callouses Q78mo... ~  2/14: on GlipizER-5mg -1/2 daily & BS today=144, A1c=8.2 which is worse! Needs better low carb diet & incr GlipER to 5mg  Qam (watch BS at home). ~  5/14: on GlipizER-5mg /d & BS today=236, A1c=8.4 which is worse! Needs better low carb diet & incr GlipER to 5mg Bid (watch BS at home). ~  8/14: on GlipizER-5mg Bid & BS today=154, A1c=8.3  Needs better low carb diet & we will add MetformER500Qam (don't want to push too hard).  GERD (ICD-530.81) - on PRILOSEC 20mg /d, doing well without heartburn, n/v, etc... Hx HH, GERD, and prev DU... last EGD was 7/02 showing HH, reflux, stricture, gastric ulcer... she has refused colonoscopies in the past...  UTI'S, HX OF (ICD-V13.00) - hx UTI's, s/p hysterectomy, prev bladder surg... ~  8/11:  ER visit after fall w/ UTI Rx'd w/ Macrobid... ~  Urinary incont/ bladder prob followed by DrTannenbaum...  DEGENERATIVE JOINT DISEASE  (ICD-715.90)  - severe arthritis w/ prev right TKR 1990... followed by DrGioffre, DrBednarz, DrSikora (hammer toes)- not willing to consider more surg... uses Vicodin 2-3 per day as needed, shots not helpful in the past... leg discomfort may be neuropathy (but monofilament test is normal)- on NEURONTIN Rx taking 300mg - 2Qhs, & VICODIN as needed... ~  she ambulates w/ walker, she has had phys therapy & does chair exercises, she has knee braces, etc...  VITAMIN D DEFICIENCY (ICD-268.9) - not currently taking any supplement. ~  labs 7/09 showed Vit D level = 11... rec> start Vit D 50000 u weekly (but she stopped after 29mo). ~  labs 3/10 showed Vit D level = 30... rec> change to 2000 u daily. ~  Labs 3/12 showed Vit D level = 50 ~  Labs 5/13 showed Vit D level = 56  ABNORMALITY OF GAIT (ICD-781.2) & Frequent FALLS>  ~  9/11:  she fell 2wks ago- hit occiput w/ hematoma on scalp> ER eval reviewed: lost balance; fell into bathtub; no LOC;  labs- OK, BS=180, +UTI given macrobid;  CT Brain= atrophy, chr microvasc dis, NAD;  CT CSpine= multilevel spondylosis & suboccipital hematoma in soft tissues w/o fx etc...   ~  11/11:  she fell yest- hit right occiput on commode w/ bruise but no hematoma; didn't go to the ER; no focal neuro deficits & we discussed checking CT Brain to be sure no subdural etc... ~  11/12:  She continues weak, gait abn, but manages her own ADLs ok...  ANXIETY (ICD-300.00)  DERMATITIS - uses generic LIDEX E cream as needed...  Health Maintenance:  she gets the yearly Flu vaccine each Autumn;  and she had additional PNEUMOVAX ~2008 at [facility], she says.   Review of Systems Constitutional:   No  weight loss, night sweats,  Fevers, chills,  +fatigue, or  lassitude.  HEENT:   No headaches,  Difficulty swallowing,  Tooth/dental problems, or  Sore throat,                No sneezing, itching, ear ache, nasal congestion, post nasal drip,   CV:  No chest pain,  Orthopnea, PND,  anasarca,  dizziness, palpitations, syncope.   GI  No heartburn, indigestion, abdominal pain, nausea, vomiting, diarrhea, change in bowel habits, loss of appetite, bloody stools.   Resp:  .  No excess mucus, no productive cough,  No non-productive cough,  No coughing up of blood.  No change in color of mucus.  No wheezing.  No chest wall deformity  Skin: no rash or lesions.  GU: + dysuria, change in color of urine, +urgency or frequency.  No flank pain, no hematuria   MS:    No back pain.  Psych:  No change in mood or affect. No depression or anxiety.  No memory loss.         Objective:   Physical Exam  GEN: A/Ox3; pleasant , NAD, elderly and frail   HEENT:  [place]/AT,  EACs-clear, TMs-wnl, NOSE-clear, THROAT-clear, no lesions, no postnasal drip or exudate noted.   NECK:  Supple w/ fair ROM; no JVD; normal carotid impulses w/o bruits; no thyromegaly or nodules palpated; no lymphadenopathy.  RESP  Clear  P & A; w/o, wheezes/ rales/ or rhonchi.no accessory muscle use, no dullness to percussion  CARD:  RRR, no m/r/g  , 1+  peripheral edema, pulses intact, no cyanosis or clubbing.  GI:   Soft & nt; nml bowel sounds; no organomegaly or masses detected.  Musco: Warm bil, no deformities or joint swelling noted.   Neuro: alert, no focal deficits noted.    Skin: Warm, no lesions or rashes        Assessment & Plan:

## 2013-11-18 LAB — URINE CULTURE: Colony Count: >=100000

## 2013-11-19 ENCOUNTER — Telehealth: Payer: Self-pay | Admitting: Pulmonary Disease

## 2013-11-19 NOTE — Telephone Encounter (Signed)
Spoke with pt's daughter and informed of lab results per [employer]

## 2013-11-20 ENCOUNTER — Other Ambulatory Visit: Payer: Self-pay | Admitting: Pulmonary Disease

## 2013-12-06 ENCOUNTER — Telehealth: Payer: Self-pay | Admitting: Pulmonary Disease

## 2013-12-06 MED ORDER — HYDROCODONE-ACETAMINOPHEN 5-325 MG PO TABS: ORAL_TABLET | ORAL | Status: DC

## 2013-12-06 NOTE — Telephone Encounter (Signed)
Pts family picked up the rx for the hydrocodone for the pt.  Nothing further is needed.

## 2013-12-10 ENCOUNTER — Telehealth: Payer: Self-pay | Admitting: Pulmonary Disease

## 2013-12-10 NOTE — Telephone Encounter (Signed)
Called and spoke with [name] and she stated that the pt will be going into the nursing home.  She stated that the facility requires that the pt be seen within 30 days of entering the facility.  [name] has the FL2 form.  [name] also requested that SN help her explain to the pt why she would be safer in the facility since she is almost falling daily.  Pt has a hard time transferring from the chair to the bed, etc.  appt has been scheduled with SN on 2-6.

## 2013-12-22 ENCOUNTER — Emergency Department (HOSPITAL_COMMUNITY): Payer: Medicare Other

## 2013-12-22 ENCOUNTER — Inpatient Hospital Stay (HOSPITAL_COMMUNITY)
Admission: EM | Admit: 2013-12-22 | Discharge: 2013-12-24 | DRG: 065 | Disposition: A | Discharge status: Hospice | Payer: Medicare Other | Attending: Internal Medicine | Admitting: Internal Medicine

## 2013-12-22 ENCOUNTER — Encounter (HOSPITAL_COMMUNITY): Payer: Self-pay | Admitting: Emergency Medicine

## 2013-12-22 ENCOUNTER — Inpatient Hospital Stay (HOSPITAL_COMMUNITY): Payer: Medicare Other

## 2013-12-22 DIAGNOSIS — Z96659 Presence of unspecified artificial knee joint: Secondary | ICD-10-CM

## 2013-12-22 DIAGNOSIS — I639 Cerebral infarction, unspecified: Secondary | ICD-10-CM | POA: Diagnosis present

## 2013-12-22 DIAGNOSIS — I6340 Cerebral infarction due to embolism of unspecified cerebral artery: Principal | ICD-10-CM

## 2013-12-22 DIAGNOSIS — I6350 Cerebral infarction due to unspecified occlusion or stenosis of unspecified cerebral artery: Secondary | ICD-10-CM

## 2013-12-22 DIAGNOSIS — IMO0002 Reserved for concepts with insufficient information to code with codable children: Secondary | ICD-10-CM | POA: Diagnosis not present

## 2013-12-22 DIAGNOSIS — F0390 Unspecified dementia without behavioral disturbance: Secondary | ICD-10-CM | POA: Diagnosis present

## 2013-12-22 DIAGNOSIS — E785 Hyperlipidemia, unspecified: Secondary | ICD-10-CM | POA: Diagnosis present

## 2013-12-22 DIAGNOSIS — Z8249 Family history of ischemic heart disease and other diseases of the circulatory system: Secondary | ICD-10-CM

## 2013-12-22 DIAGNOSIS — G8190 Hemiplegia, unspecified affecting unspecified side: Secondary | ICD-10-CM | POA: Diagnosis present

## 2013-12-22 DIAGNOSIS — E559 Vitamin D deficiency, unspecified: Secondary | ICD-10-CM | POA: Diagnosis present

## 2013-12-22 DIAGNOSIS — I4891 Unspecified atrial fibrillation: Secondary | ICD-10-CM

## 2013-12-22 DIAGNOSIS — Z9089 Acquired absence of other organs: Secondary | ICD-10-CM

## 2013-12-22 DIAGNOSIS — Z833 Family history of diabetes mellitus: Secondary | ICD-10-CM

## 2013-12-22 DIAGNOSIS — Z515 Encounter for palliative care: Secondary | ICD-10-CM

## 2013-12-22 DIAGNOSIS — R269 Unspecified abnormalities of gait and mobility: Secondary | ICD-10-CM | POA: Diagnosis present

## 2013-12-22 DIAGNOSIS — H3530 Unspecified macular degeneration: Secondary | ICD-10-CM | POA: Diagnosis present

## 2013-12-22 DIAGNOSIS — F411 Generalized anxiety disorder: Secondary | ICD-10-CM | POA: Diagnosis present

## 2013-12-22 DIAGNOSIS — I5189 Other ill-defined heart diseases: Secondary | ICD-10-CM | POA: Diagnosis present

## 2013-12-22 DIAGNOSIS — E7800 Pure hypercholesterolemia, unspecified: Secondary | ICD-10-CM | POA: Diagnosis present

## 2013-12-22 DIAGNOSIS — I872 Venous insufficiency (chronic) (peripheral): Secondary | ICD-10-CM | POA: Diagnosis present

## 2013-12-22 DIAGNOSIS — K219 Gastro-esophageal reflux disease without esophagitis: Secondary | ICD-10-CM | POA: Diagnosis present

## 2013-12-22 DIAGNOSIS — R4701 Aphasia: Secondary | ICD-10-CM

## 2013-12-22 DIAGNOSIS — E119 Type 2 diabetes mellitus without complications: Secondary | ICD-10-CM

## 2013-12-22 DIAGNOSIS — R1310 Dysphagia, unspecified: Secondary | ICD-10-CM | POA: Diagnosis present

## 2013-12-22 DIAGNOSIS — I10 Essential (primary) hypertension: Secondary | ICD-10-CM

## 2013-12-22 DIAGNOSIS — Z8261 Family history of arthritis: Secondary | ICD-10-CM

## 2013-12-22 DIAGNOSIS — M1990 Unspecified osteoarthritis, unspecified site: Secondary | ICD-10-CM | POA: Diagnosis present

## 2013-12-22 DIAGNOSIS — R29810 Facial weakness: Secondary | ICD-10-CM | POA: Diagnosis present

## 2013-12-22 DIAGNOSIS — Z66 Do not resuscitate: Secondary | ICD-10-CM | POA: Diagnosis present

## 2013-12-22 DIAGNOSIS — Z803 Family history of malignant neoplasm of breast: Secondary | ICD-10-CM

## 2013-12-22 DIAGNOSIS — Z993 Dependence on wheelchair: Secondary | ICD-10-CM

## 2013-12-22 DIAGNOSIS — Z9181 History of falling: Secondary | ICD-10-CM

## 2013-12-22 DIAGNOSIS — I509 Heart failure, unspecified: Secondary | ICD-10-CM | POA: Diagnosis present

## 2013-12-22 DIAGNOSIS — Z800 Family history of malignant neoplasm of digestive organs: Secondary | ICD-10-CM

## 2013-12-22 LAB — CBC
HEMATOCRIT: 43.1 % (ref 36.0–46.0)
HEMOGLOBIN: 14.5 g/dL (ref 12.0–15.0)
MCH: 32.7 pg (ref 26.0–34.0)
MCHC: 33.6 g/dL (ref 30.0–36.0)
MCV: 97.1 fL (ref 78.0–100.0)
Platelets: 162 10*3/uL (ref 150–400)
RBC: 4.44 MIL/uL (ref 3.87–5.11)
RDW: 14.3 % (ref 11.5–15.5)
WBC: 11.7 10*3/uL — ABNORMAL HIGH (ref 4.0–10.5)

## 2013-12-22 LAB — URINALYSIS, ROUTINE W REFLEX MICROSCOPIC
Bilirubin Urine: NEGATIVE
GLUCOSE, UA: NEGATIVE mg/dL
Hgb urine dipstick: NEGATIVE
KETONES UR: NEGATIVE mg/dL
Leukocytes, UA: NEGATIVE
Nitrite: NEGATIVE
PH: 6 (ref 5.0–8.0)
Protein, ur: NEGATIVE mg/dL
Specific Gravity, Urine: 1.013 (ref 1.005–1.030)
Urobilinogen, UA: 0.2 mg/dL (ref 0.0–1.0)

## 2013-12-22 LAB — HEMOGLOBIN A1C
Hgb A1c MFr Bld: 7.1 % — ABNORMAL HIGH (ref <5.7)
Mean Plasma Glucose: 157 mg/dL — ABNORMAL HIGH (ref <117)

## 2013-12-22 LAB — APTT: APTT: 30 s (ref 24–37)

## 2013-12-22 LAB — BASIC METABOLIC PANEL
BUN: 35 mg/dL — ABNORMAL HIGH (ref 6–23)
CHLORIDE: 95 meq/L — AB (ref 96–112)
CO2: 27 mEq/L (ref 19–32)
Calcium: 9.3 mg/dL (ref 8.4–10.5)
Creatinine, Ser: 1.52 mg/dL — ABNORMAL HIGH (ref 0.50–1.10)
GFR calc Af Amer: 31 mL/min — ABNORMAL LOW (ref >90)
GFR, EST NON AFRICAN AMERICAN: 27 mL/min — AB (ref >90)
GLUCOSE: 233 mg/dL — AB (ref 70–99)
POTASSIUM: 4.7 meq/L (ref 3.7–5.3)
Sodium: 140 mEq/L (ref 137–147)

## 2013-12-22 LAB — GLUCOSE, CAPILLARY
GLUCOSE-CAPILLARY: 145 mg/dL — AB (ref 70–99)
GLUCOSE-CAPILLARY: 189 mg/dL — AB (ref 70–99)
GLUCOSE-CAPILLARY: 198 mg/dL — AB (ref 70–99)
GLUCOSE-CAPILLARY: 229 mg/dL — AB (ref 70–99)
Glucose-Capillary: 180 mg/dL — ABNORMAL HIGH (ref 70–99)
Glucose-Capillary: 186 mg/dL — ABNORMAL HIGH (ref 70–99)

## 2013-12-22 LAB — PRO B NATRIURETIC PEPTIDE: Pro B Natriuretic peptide (BNP): 2641 pg/mL — ABNORMAL HIGH (ref 0–450)

## 2013-12-22 LAB — TROPONIN I

## 2013-12-22 LAB — PROTIME-INR
INR: 0.92 (ref 0.00–1.49)
Prothrombin Time: 12.2 seconds (ref 11.6–15.2)

## 2013-12-22 LAB — CG4 I-STAT (LACTIC ACID): Lactic Acid, Venous: 4.07 mmol/L — ABNORMAL HIGH (ref 0.5–2.2)

## 2013-12-22 MED ORDER — METOPROLOL TARTRATE 1 MG/ML IV SOLN
INTRAVENOUS | Status: AC
  Filled 2013-12-22: qty 5

## 2013-12-22 MED ORDER — LABETALOL HCL 5 MG/ML IV SOLN
10.0000 mg | Freq: Four times a day (QID) | INTRAVENOUS | Status: DC | PRN
  Administered 2013-12-22 – 2013-12-23 (×4): 10 mg via INTRAVENOUS
  Filled 2013-12-22: qty 4

## 2013-12-22 MED ORDER — ACETAMINOPHEN 325 MG PO TABS: 650.0000 mg | ORAL_TABLET | ORAL | Status: DC | PRN

## 2013-12-22 MED ORDER — ASPIRIN 325 MG PO TABS
325.0000 mg | ORAL_TABLET | Freq: Every day | ORAL | Status: DC
  Filled 2013-12-22 (×3): qty 1

## 2013-12-22 MED ORDER — SODIUM CHLORIDE 0.9 % IV SOLN
INTRAVENOUS | Status: DC
  Administered 2013-12-22 – 2013-12-23 (×3): via INTRAVENOUS

## 2013-12-22 MED ORDER — METOPROLOL TARTRATE 1 MG/ML IV SOLN
5.0000 mg | Freq: Once | INTRAVENOUS | Status: AC
  Administered 2013-12-22: 5 mg via INTRAVENOUS

## 2013-12-22 MED ORDER — DILTIAZEM HCL 25 MG/5ML IV SOLN
10.0000 mg | Freq: Once | INTRAVENOUS | Status: AC
  Administered 2013-12-22: 10 mg via INTRAVENOUS
  Filled 2013-12-22: qty 5

## 2013-12-22 MED ORDER — LOSARTAN POTASSIUM 50 MG PO TABS
50.0000 mg | ORAL_TABLET | Freq: Every day | ORAL | Status: DC
  Filled 2013-12-22 (×2): qty 1

## 2013-12-22 MED ORDER — METOPROLOL TARTRATE 1 MG/ML IV SOLN: 5.0000 mg | Freq: Four times a day (QID) | INTRAVENOUS | Status: DC

## 2013-12-22 MED ORDER — LABETALOL HCL 5 MG/ML IV SOLN
20.0000 mg | Freq: Once | INTRAVENOUS | Status: AC
  Administered 2013-12-22: 20 mg via INTRAVENOUS
  Filled 2013-12-22: qty 4

## 2013-12-22 MED ORDER — SIMVASTATIN 20 MG PO TABS
20.0000 mg | ORAL_TABLET | Freq: Every day | ORAL | Status: DC
  Filled 2013-12-22 (×2): qty 1

## 2013-12-22 MED ORDER — STROKE: EARLY STAGES OF RECOVERY BOOK
Freq: Once | Status: AC
  Administered 2013-12-22: 12:00:00
  Filled 2013-12-22: qty 1

## 2013-12-22 MED ORDER — METOPROLOL TARTRATE 1 MG/ML IV SOLN
2.5000 mg | Freq: Three times a day (TID) | INTRAVENOUS | Status: DC
  Filled 2013-12-22: qty 5

## 2013-12-22 MED ORDER — LABETALOL HCL 5 MG/ML IV SOLN
INTRAVENOUS | Status: AC
  Filled 2013-12-22: qty 4

## 2013-12-22 MED ORDER — SENNOSIDES-DOCUSATE SODIUM 8.6-50 MG PO TABS
1.0000 | ORAL_TABLET | Freq: Every evening | ORAL | Status: DC | PRN
  Filled 2013-12-22: qty 1

## 2013-12-22 MED ORDER — METOPROLOL TARTRATE 1 MG/ML IV SOLN
5.0000 mg | Freq: Four times a day (QID) | INTRAVENOUS | Status: DC
  Administered 2013-12-22 – 2013-12-23 (×3): 5 mg via INTRAVENOUS
  Filled 2013-12-22 (×7): qty 5

## 2013-12-22 MED ORDER — GABAPENTIN 300 MG PO CAPS
600.0000 mg | ORAL_CAPSULE | Freq: Every day | ORAL | Status: DC
  Filled 2013-12-22 (×2): qty 2

## 2013-12-22 MED ORDER — NICARDIPINE HCL IN NACL 20-0.86 MG/200ML-% IV SOLN: 3.0000 mg/h | Freq: Once | INTRAVENOUS | Status: DC

## 2013-12-22 MED ORDER — ONDANSETRON HCL 4 MG/2ML IJ SOLN
4.0000 mg | Freq: Three times a day (TID) | INTRAMUSCULAR | Status: AC | PRN
Start: 2013-12-22 — End: 2013-12-22

## 2013-12-22 MED ORDER — LABETALOL HCL 5 MG/ML IV SOLN
10.0000 mg | Freq: Once | INTRAVENOUS | Status: AC
  Administered 2013-12-22: 10 mg via INTRAVENOUS
  Filled 2013-12-22: qty 4

## 2013-12-22 MED ORDER — INSULIN ASPART 100 UNIT/ML ~~LOC~~ SOLN
0.0000 [IU] | SUBCUTANEOUS | Status: DC
  Administered 2013-12-22: 1 [IU] via SUBCUTANEOUS
  Administered 2013-12-22 (×4): 2 [IU] via SUBCUTANEOUS
  Administered 2013-12-23 (×2): 3 [IU] via SUBCUTANEOUS

## 2013-12-22 MED ORDER — ACETAMINOPHEN 650 MG RE SUPP
650.0000 mg | RECTAL | Status: DC | PRN
  Administered 2013-12-23: 650 mg via RECTAL
  Filled 2013-12-22: qty 1

## 2013-12-22 MED ORDER — ASPIRIN EC 81 MG PO TBEC
81.0000 mg | DELAYED_RELEASE_TABLET | Freq: Every day | ORAL | Status: DC
Start: 2013-12-22 — End: 2013-12-22
  Filled 2013-12-22: qty 1

## 2013-12-22 MED ORDER — CARVEDILOL 12.5 MG PO TABS
12.5000 mg | ORAL_TABLET | Freq: Two times a day (BID) | ORAL | Status: DC
Start: 2013-12-22 — End: 2013-12-23
  Filled 2013-12-22 (×5): qty 1

## 2013-12-22 MED ORDER — SODIUM CHLORIDE 0.9 % IV SOLN
INTRAVENOUS | Status: DC
  Administered 2013-12-22: 02:00:00 via INTRAVENOUS

## 2013-12-22 MED ORDER — ASPIRIN 300 MG RE SUPP
300.0000 mg | Freq: Every day | RECTAL | Status: DC
  Administered 2013-12-22 – 2013-12-23 (×2): 300 mg via RECTAL
  Filled 2013-12-22 (×3): qty 1

## 2013-12-22 NOTE — Progress Notes (Addendum)
Patient's BP at 8:00 am 189/113, MD notified & new orders received to give IV labetalol prn.   Patient's BP 204/95 at 10:44, MD notified, orders for IV metoprolol given.   Patient's BP 205/104 at 12, MD notified, IV Cardizem given. Patient's BP 212/101 at 2, MD notified, increase in IV metoprolol given.  Pressure decreased to 179/101.  Will continue to closely monitor. [place], Mitzi HansenJessica Marie

## 2013-12-22 NOTE — Consult Note (Addendum)
Referring Physician: ED    Chief Complaint: CODE STROKE:  APHASIA, RIGHT HEMIPARESIS, RIGHT FACE WEAKNESS.  HPI:                                                                                                                                         [name] is an 80 y.o. female with a past medical history significant for HTN, hypercholesterolemia, chronic congestive heart failure, atrial fibrillation off coumadin due to frequent falls, DJD, brought to [facility] ED by ambulance as a code stroke due to the above stated constellation of symptoms/signs. She lives home with a daughter and was last known well at 9 pm last night. Then, around 1245 am her daughter noticed that she was struggling trying to get out of bed and she couldn't do it. Daughter said that her mother was less responsive, was not able to talk, and her right side was weaker than the left. EMS was summoned and they confirmed that she was unable to speak and had right sided weakness with right face droop. Initial SBP>230. Her initial NIHSS was 21. CT brain showed no acute abnormality.  Date last known well:  12/21/13 Time last known well: 9 pm  tPA Given: no, out of the window for IV tpa NIHSS: 21 MRS: 4  Past Medical History  Diagnosis Date  . Macular degeneration (senile) of retina, unspecified   . Pulmonary nodule   . Unspecified essential hypertension   . Congestive heart failure, unspecified   . Atrial fibrillation   . Unspecified venous (peripheral) insufficiency   . Pure hypercholesterolemia   . GERD (gastroesophageal reflux disease)   . Personal history of unspecified urinary disorder   . DJD (degenerative joint disease)   . Unspecified vitamin D deficiency   . Abnormality of gait   . Anxiety state, unspecified   . Type II or unspecified type diabetes mellitus without mention of complication, not stated as uncontrolled     Past Surgical History  Procedure Laterality Date  . Cholecystectomy  6/07    Dr. Corliss Skains  .  Total abdominal hysterectomy    . Bladder surgery    . Total knee arthroplasty  1990    right - Dr. Darrelyn Hillock    Family History  Problem Relation Age of Onset  . Rheum arthritis Father   . Breast cancer Sister   . Liver cancer Mother   . Heart attack Father   . Heart disease Sister   . Heart disease Sister   . Diabetes Mother    Social History:  reports that she has never smoked. She has never used smokeless tobacco. She reports that she does not drink alcohol or use illicit drugs.  Allergies:  Allergies  Allergen Reactions  . Celecoxib     GI discomfort  . Diclofenac Sodium     REACTION: causing burining sensation after use    Medications:  I have reviewed the patient's current medications.  ROS: unable to obtain due to mental status                                                                                                                                     History obtained from chart review and family.   Physical exam: pleasant female in no apparent distress. BP 230/80 P 82 R 17, afebrile Head: normocephalic. Neck: supple, no bruits, no JVD. Cardiac: no murmurs. Lungs: clear. Abdomen: soft, no tender, no mass. Extremities: no edema. CV: pulses palpable throughout  Neurologic Examination:                                                                                                      Mental Status: Awake but seems to be globally aphasic. Cranial Nerves: II: Discs flat bilaterally; Visual fields grossly normal, pupils equal, round, reactive to light and accommodation III,IV, VI: ptosis not present. Left gaze preference. V,VII: smile asymmetric with mild right face weakness, facial light touch sensation unable to test. VIII: hearing no tested IX,X: gag reflex present XI: bilateral shoulder shrug no tested XII: midline  tongue  Motor: Significant for right hemiparesis Tone and bulk normal for age Sensory:  No reaction to pain Deep Tendon Reflexes:  1 all over. Plantars: Right: mute   Left: mute Cerebellar: Couldn't be tested. Gait:  Couldn't be tested.    No results found for this or any previous visit (from the past 48 hour(s)). Ct Head Wo Contrast  12/22/2013   CLINICAL DATA:  Code stroke.  Aphasia.  EXAM: CT HEAD WITHOUT CONTRAST  TECHNIQUE: Contiguous axial images were obtained from the base of the skull through the vertex without intravenous contrast.  COMPARISON:  CT of the head performed 10/17/2013  FINDINGS: There is no evidence of acute infarction, mass lesion, or intra- or extra-axial hemorrhage on CT.  Prominence of the ventricles and sulci reflects mild to moderate cortical volume loss. Diffuse periventricular and subcortical white matter change likely reflects small vessel ischemic microangiopathy. Mild cerebellar atrophy is noted.  The brainstem and fourth ventricle are within normal limits. The basal ganglia are unremarkable in appearance. The cerebral hemispheres demonstrate grossly normal gray-white differentiation. No mass effect or midline shift is seen.  There is no evidence of fracture; visualized osseous structures are unremarkable in appearance. The orbits are within normal limits. The paranasal sinuses and mastoid air cells are well-aerated. No significant soft tissue abnormalities are seen.  IMPRESSION: 1. No  acute intracranial pathology seen on CT. 2. Mild to moderate cortical volume loss and diffuse small vessel ischemic microangiopathy.  These results were called by telephone at the time of interpretation on 12/22/2013 at 1:30 AM to Dr. Derwood KaplanANKIT NANAVATI , who verbally acknowledged these results.   Electronically Signed   By: Roanna RaiderJeffery  Chang M.D.   On: 12/22/2013 01:30     Assessment: [age] y.o. female with acute onset left hemispheric syndrome most likely resulting from an embolic left  MCA distribution ischemic stroke in the context of atrial fibrillation. NIHSS 21, CT brain without acute abnormality. Out of the window for IV thrombolysis. NIHSS 21 is concerning for a clot/stenosis left MCA. Spoke with family and they don't want to pursue aggressive intervention to treat this stroke. Patient is DNR.  Admit to medicine and complete stroke work up. Follow stroke non tpa protocol.   Stroke Risk Factors - AGE, HTN, hypercholesterolemia, atrial fibrillation.  Plan: 1. HgbA1c, fasting lipid panel 2. MRI, MRA  of the brain without contrast 3. Echocardiogram 4. Carotid dopplers 5. Prophylactic therapy-aspirin rectally. 6. Risk factor modification 7. Telemetry monitoring 8. Frequent neuro checks 9. PT/OT SLP   Wyatt Portelasvaldo Camilo, MD [facility] [phone]  12/22/2013, 1:41 AM

## 2013-12-22 NOTE — Plan of Care (Signed)
Problem: Acute Treatment Outcomes Goal: Prognosis discussed with family/patient as appropriate Outcome: Completed/Met Date Met:  12/22/13 Palliative consult requested

## 2013-12-22 NOTE — Progress Notes (Signed)
Patient ID: [name]  female  ZOX:[id number]    DOB: 09/14/1911    DOA: 12/22/2013  PCP: Michele Mcalpine, MD  Assessment/Plan: Acute cardioembolic stroke with aphasia: Right-sided hemiparesis - Neurology consulted, discussed in detail with the patient's family they want to pursue MRI of the brain - Discussed with Dr. Anne Hahn who also discussed her in detail with the patient's family, MRA, 2-D echo, carotid Dopplers were discontinued as it would not change the management at this time - Continue aspirin for now, NPO for possible swallow eval. Patient was on regular diet prior to the stroke per the family     malignant/accelerated hypertension  - Placed on labetalol with parameters, metoprolol 5 mg x 1 for SBP 216/96  Diabetes mellitus  - Currently n.p.o., placed on SSI   atrial fibrillation with RVR  - Will give one dose of metoprolol and placed on metoprolol 2.5mg  q8 hours   DVT Prophylaxis:  Code Status: DNR/DNI   Family Communication: discussed in detail with patient's 2 daughters, son-in-law and granddaughter in the room. They all agree DNR/DNI, daughter relate to me that per patient's wishes, she never wanted artificial feeding or nursing home placement. If patient fails swallow evaluation, they do not want any artificial feeding. However patient has been total care in the last few months and patient's family had been having difficult time taking care of her, requested for nursing home placement.   Disposition:    Subjective: Aphasia although awake but not following any verbal commands   Objective: Weight change:   Intake/Output Summary (Last 24 hours) at 12/22/13 1106 Last data filed at 12/22/13 1000  Gross per 24 hour  Intake      0 ml  Output    925 ml  Net   -925 ml   Blood pressure 204/95, pulse 108, temperature 98.1 F (36.7 C), temperature source Axillary, resp. rate 24, height 5\' 3"  (1.6 m), weight 81.511 kg (179 lb 11.2 oz), SpO2 93.00%.  Physical  Exam: General: awake but aphasia  CVS: S1-S2 clear, no murmur rubs or gallops Chest: clear to auscultation bilaterally, no wheezing, rales or rhonchi Abdomen: soft nontender, nondistended, normal bowel sounds  Extremities: no cyanosis, clubbing or edema noted bilaterally Neuro does not follow commands, able to raise up both hands but not the legs   Lab Results: Basic Metabolic Panel:  Recent Labs Lab 12/22/13 0150  NA 140  K 4.7  CL 95*  CO2 27  GLUCOSE 233*  BUN 35*  CREATININE 1.52*  CALCIUM 9.3   Liver Function Tests: No results found for this basename: AST, ALT, ALKPHOS, BILITOT, PROT, ALBUMIN,  in the last 168 hours No results found for this basename: LIPASE, AMYLASE,  in the last 168 hours No results found for this basename: AMMONIA,  in the last 168 hours CBC:  Recent Labs Lab 12/22/13 0150  WBC 11.7*  HGB 14.5  HCT 43.1  MCV 97.1  PLT 162   Cardiac Enzymes:  Recent Labs Lab 12/22/13 0150  TROPONINI <0.30   BNP: No components found with this basename: POCBNP,  CBG:  Recent Labs Lab 12/22/13 0310 12/22/13 0437 12/22/13 0816  GLUCAP 229* 189* 145*     Micro Results: No results found for this or any previous visit (from the past 240 hour(s)).  Studies/Results: Ct Head Wo Contrast  12/22/2013   CLINICAL DATA:  Code stroke.  Aphasia.  EXAM: CT HEAD WITHOUT CONTRAST  TECHNIQUE: Contiguous axial images were obtained from the base of  the skull through the vertex without intravenous contrast.  COMPARISON:  CT of the head performed 10/17/2013  FINDINGS: There is no evidence of acute infarction, mass lesion, or intra- or extra-axial hemorrhage on CT.  Prominence of the ventricles and sulci reflects mild to moderate cortical volume loss. Diffuse periventricular and subcortical white matter change likely reflects small vessel ischemic microangiopathy. Mild cerebellar atrophy is noted.  The brainstem and fourth ventricle are within normal limits. The basal  ganglia are unremarkable in appearance. The cerebral hemispheres demonstrate grossly normal gray-white differentiation. No mass effect or midline shift is seen.  There is no evidence of fracture; visualized osseous structures are unremarkable in appearance. The orbits are within normal limits. The paranasal sinuses and mastoid air cells are well-aerated. No significant soft tissue abnormalities are seen.  IMPRESSION: 1. No acute intracranial pathology seen on CT. 2. Mild to moderate cortical volume loss and diffuse small vessel ischemic microangiopathy.  These results were called by telephone at the time of interpretation on 12/22/2013 at 1:30 AM to Dr. Derwood KaplanANKIT NANAVATI , who verbally acknowledged these results.   Electronically Signed   By: Roanna RaiderJeffery  Chang M.D.   On: 12/22/2013 01:30   Dg Chest Port 1 View  12/22/2013   CLINICAL DATA:  Weakness; assess for congestive heart failure.  EXAM: PORTABLE CHEST - 1 VIEW  COMPARISON:  Chest radiograph performed 10/17/2013  FINDINGS: The lungs are well-aerated. An apparent small left pleural effusion is noted, with mild bibasilar atelectasis. No pneumothorax is seen. There is no evidence for pulmonary edema. Mild right hilar prominence is thought to reflect normal vasculature, unchanged from prior studies.  The cardiomediastinal silhouette is borderline normal in size. No acute osseous abnormalities are seen.  IMPRESSION: Apparent small left pleural effusion, with mild bibasilar atelectasis. No evidence for pulmonary edema.   Electronically Signed   By: Roanna RaiderJeffery  Chang M.D.   On: 12/22/2013 01:40    Medications: Scheduled Meds: . aspirin  300 mg Rectal Daily   Or  . aspirin  325 mg Oral Daily  . carvedilol  12.5 mg Oral BID WC  . gabapentin  600 mg Oral QHS  . insulin aspart  0-9 Units Subcutaneous Q4H  . losartan  50 mg Oral Daily  . metoprolol      . simvastatin  20 mg Oral q1800      LOS: 0 days   Bharat Antillon M.D. [facility] 12/22/2013, 11:06  AM Pager: [phone]  If 7PM-7AM, please contact night-coverage [email or web address] Password [email or web address]

## 2013-12-22 NOTE — Progress Notes (Signed)
Stroke Team Progress Note  HISTORY [name] is an 80 y.o. female with a past medical history significant for HTN, hypercholesterolemia, chronic congestive heart failure, atrial fibrillation off coumadin due to frequent falls, DJD, brought to [facility] ED by ambulance as a code stroke due to the above stated constellation of symptoms/signs.  She lives home with a daughter and was last known well at 9 pm last night. Then, around 1245 am her daughter noticed that she was struggling trying to get out of bed and she couldn't do it.  Daughter said that her mother was less responsive, was not able to talk, and her right side was weaker than the left.  EMS was summoned and they confirmed that she was unable to speak and had right sided weakness with right face droop. Initial SBP>230.  Her initial NIHSS was 21.  CT brain showed no acute abnormality.  Date last known well:  Time last known well:  tPA Given: no, out of the window for IV tpa  NIHSS: 21  MRS: 4    Patient was not a TPA candidate secondary to delay in presentation.   SUBJECTIVE Her daughters are at bedside. The patient has been unable to speak, understand language.  OBJECTIVE Most recent Vital Signs: Filed Vitals:   12/22/13 0315 12/22/13 0400 12/22/13 0610 12/22/13 0800  BP: 174/88 200/101 163/91 189/113  Pulse: 104 91 96 103  Temp:  99.2 F (37.3 C) 99 F (37.2 C) 98.7 F (37.1 C)  TempSrc:  Oral Oral Axillary  Resp: 18 18 18 21   Height:  5\' 3"  (1.6 m)    Weight:  179 lb 11.2 oz (81.511 kg)    SpO2: 93% 95% 96% 92%   CBG (last 3)   Recent Labs  12/22/13 0310 12/22/13 0437 12/22/13 0816  GLUCAP 229* 189* 145*    IV Fluid Intake:   . sodium chloride 50 mL/hr at 12/22/13 0431    MEDICATIONS  . aspirin  300 mg Rectal Daily   Or  . aspirin  325 mg Oral Daily  . carvedilol  12.5 mg Oral BID WC  . gabapentin  600 mg Oral QHS  . insulin aspart  0-9 Units Subcutaneous Q4H  . labetalol      . losartan  50 mg Oral  Daily  . simvastatin  20 mg Oral q1800   PRN:  acetaminophen, acetaminophen, labetalol, ondansetron (ZOFRAN) IV, senna-docusate  Diet:  NPO  Activity:  Bedrest DVT Prophylaxis:  SCD  CLINICALLY SIGNIFICANT STUDIES Basic Metabolic Panel:  Recent Labs Lab 12/22/13 0150  NA 140  K 4.7  CL 95*  CO2 27  GLUCOSE 233*  BUN 35*  CREATININE 1.52*  CALCIUM 9.3   Liver Function Tests: No results found for this basename: AST, ALT, ALKPHOS, BILITOT, PROT, ALBUMIN,  in the last 168 hours CBC:  Recent Labs Lab 12/22/13 0150  WBC 11.7*  HGB 14.5  HCT 43.1  MCV 97.1  PLT 162   Coagulation:  Recent Labs Lab 12/22/13 0150  LABPROT 12.2  INR 0.92   Cardiac Enzymes:  Recent Labs Lab 12/22/13 0150  TROPONINI <0.30   Urinalysis:  Recent Labs Lab 12/22/13 0328  COLORURINE YELLOW  LABSPEC 1.013  PHURINE 6.0  GLUCOSEU NEGATIVE  HGBUR NEGATIVE  BILIRUBINUR NEGATIVE  KETONESUR NEGATIVE  PROTEINUR NEGATIVE  UROBILINOGEN 0.2  NITRITE NEGATIVE  LEUKOCYTESUR NEGATIVE   Lipid Panel    Component Value Date/Time   CHOL 138 01/21/2013 1026   TRIG 169.0* 01/21/2013  1026   HDL 37.10* 01/21/2013 1026   CHOLHDL 4 01/21/2013 1026   VLDL 33.8 01/21/2013 1026   LDLCALC 67 01/21/2013 1026   HgbA1C  Lab Results  Component Value Date   HGBA1C 8.1* 10/01/2013    Urine Drug Screen:   No results found for this basename: labopia, cocainscrnur, labbenz, amphetmu, thcu, labbarb    Alcohol Level: No results found for this basename: ETH,  in the last 168 hours  Ct Head Wo Contrast  12/22/2013   CLINICAL DATA:  Code stroke.  Aphasia.  EXAM: CT HEAD WITHOUT CONTRAST  TECHNIQUE: Contiguous axial images were obtained from the base of the skull through the vertex without intravenous contrast.  COMPARISON:  CT of the head performed 10/17/2013  FINDINGS: There is no evidence of acute infarction, mass lesion, or intra- or extra-axial hemorrhage on CT.  Prominence of the ventricles and sulci reflects  mild to moderate cortical volume loss. Diffuse periventricular and subcortical white matter change likely reflects small vessel ischemic microangiopathy. Mild cerebellar atrophy is noted.  The brainstem and fourth ventricle are within normal limits. The basal ganglia are unremarkable in appearance. The cerebral hemispheres demonstrate grossly normal gray-white differentiation. No mass effect or midline shift is seen.  There is no evidence of fracture; visualized osseous structures are unremarkable in appearance. The orbits are within normal limits. The paranasal sinuses and mastoid air cells are well-aerated. No significant soft tissue abnormalities are seen.  IMPRESSION: 1. No acute intracranial pathology seen on CT. 2. Mild to moderate cortical volume loss and diffuse small vessel ischemic microangiopathy.  These results were called by telephone at the time of interpretation on 12/22/2013 at 1:30 AM to Dr. Derwood KaplanANKIT NANAVATI , who verbally acknowledged these results.   Electronically Signed   By: Roanna RaiderJeffery  Chang M.D.   On: 12/22/2013 01:30   Dg Chest Port 1 View  12/22/2013   CLINICAL DATA:  Weakness; assess for congestive heart failure.  EXAM: PORTABLE CHEST - 1 VIEW  COMPARISON:  Chest radiograph performed 10/17/2013  FINDINGS: The lungs are well-aerated. An apparent small left pleural effusion is noted, with mild bibasilar atelectasis. No pneumothorax is seen. There is no evidence for pulmonary edema. Mild right hilar prominence is thought to reflect normal vasculature, unchanged from prior studies.  The cardiomediastinal silhouette is borderline normal in size. No acute osseous abnormalities are seen.  IMPRESSION: Apparent small left pleural effusion, with mild bibasilar atelectasis. No evidence for pulmonary edema.   Electronically Signed   By: Roanna RaiderJeffery  Chang M.D.   On: 12/22/2013 01:40    CT of the brain   IMPRESSION:  1. No acute intracranial pathology seen on CT.  2. Mild to moderate cortical volume  loss and diffuse small vessel  ischemic microangiopathy.   MRI of the brain    MRA of the brain    2D Echocardiogram   Canceled Carotid Doppler   Canceled CXR   IMPRESSION:  Apparent small left pleural effusion, with mild bibasilar  atelectasis. No evidence for pulmonary edema.   EKG   Atrial fibrillation Low voltage QRS Inferior infarct , age undetermined T wave abnormality, consider lateral ischemia  Therapy Recommendations Pending  Physical Exam  General: The patient is alert, but she is unable to follow commands  Skin: 2 plus edema is noted in the legs below the knees.   Neurologic Exam  Mental status: The patient is aphasic, essentially mute.  Cranial nerves: Facial symmetry is present. The patient is unable to speak. Eyes  are midposition, she will blink to threat bilaterally.  Motor: The patient is not moving either leg well, mild drift on the right arm, able to hold both arms above the head.  Sensory examination: Minimal response to pain stimulation on all fours.  Coordination: The patient is unable to follow commands for cerebellar testing.  Gait and station: The patient gait was not tested.  Reflexes: Deep tendon reflexes are symmetric.    ASSESSMENT Ms. [name] is a 80 y.o. female presenting with aphasia. History of Afib, on aspirin prior to coming into the hospital. I discussed the care with the family. The patient was total care PTA, with a developing dementia. They do not want feeding tube, may consider comfort care.   Afib  Aphasia, probable left brain stroke  Dementia  DM  CHF  Dyslipidemia  DJD  Hospital day # 0  TREATMENT/PLAN  Continue aspirin  MRI brain  D/C doppler, 2 D echo, MRA head  DNR, consider comfort care  WILLIS,CHARLES KEITH  12/22/2013 9:10 AM

## 2013-12-22 NOTE — H&P (Signed)
PCP: Noralee Space, MD    Chief Complaint:  aphasia  HPI: [name] is a 80 y.o. female   has a past medical history of Macular degeneration (senile) of retina, unspecified; Pulmonary nodule; Unspecified essential hypertension; Congestive heart failure, unspecified; Atrial fibrillation; Unspecified venous (peripheral) insufficiency; Pure hypercholesterolemia; GERD (gastroesophageal reflux disease); Personal history of unspecified urinary disorder; DJD (degenerative joint disease); Unspecified vitamin D deficiency; Abnormality of gait; Anxiety state, unspecified; and Type II or unspecified type diabetes mellitus without mention of complication, not stated as uncontrolled.   Presented with  12:15 AM [name] was noted trying to go to the bathroom She was noted unable to sit up. Family were able to get her to the bathroom few min later she became aphasic. EMS was called she was noted to have facial droop patient was brought as code stroke. CT scan did not show any acute findings. Neurology has been called patietn was not a candidate for tPA. Hospitalist called for admission. Family stes she has hx of HTN that is brittle and often runs into 170's.   Review of Systems:    Pertinent positives include: weakness,   Constitutional:  No weight loss, night sweats, Fevers, chills, fatigue, weight loss  HEENT:  No headaches, Difficulty swallowing,Tooth/dental problems,Sore throat,  No sneezing, itching, ear ache, nasal congestion, post nasal drip,  Cardio-vascular:  No chest pain, Orthopnea, PND, anasarca, dizziness, palpitations.no Bilateral lower extremity swelling  GI:  No heartburn, indigestion, abdominal pain, nausea, vomiting, diarrhea, change in bowel habits, loss of appetite, melena, blood in stool, hematemesis Resp:  no shortness of breath at rest. No dyspnea on exertion, No excess mucus, no productive cough, No non-productive cough, No coughing up of blood.No change in color of mucus.No  wheezing. Skin:  no rash or lesions. No jaundice GU:  no dysuria, change in color of urine, no urgency or frequency. No straining to urinate.  No flank pain.  Musculoskeletal:  No joint pain or no joint swelling. No decreased range of motion. No back pain.  Psych:  No change in mood or affect. No depression or anxiety. No memory loss.  Neuro:  no tingling, no   double vision,  no confusion  Otherwise ROS are negative except for above, 10 systems were reviewed  Past Medical History: Past Medical History  Diagnosis Date  . Macular degeneration (senile) of retina, unspecified   . Pulmonary nodule   . Unspecified essential hypertension   . Congestive heart failure, unspecified   . Atrial fibrillation   . Unspecified venous (peripheral) insufficiency   . Pure hypercholesterolemia   . GERD (gastroesophageal reflux disease)   . Personal history of unspecified urinary disorder   . DJD (degenerative joint disease)   . Unspecified vitamin D deficiency   . Abnormality of gait   . Anxiety state, unspecified   . Type II or unspecified type diabetes mellitus without mention of complication, not stated as uncontrolled    Past Surgical History  Procedure Laterality Date  . Cholecystectomy  6/07    Dr. Georgette Dover  . Total abdominal hysterectomy    . Bladder surgery    . Total knee arthroplasty  1990    right - Dr. Gladstone Lighter     Medications: Prior to Admission medications   Medication Sig Start Date End Date Taking? Authorizing Provider  acetaminophen (TYLENOL) 325 MG tablet Take 650 mg by mouth once. as needed - alternates this with hydrocodone   Yes Historical Provider, MD  aspirin 325 MG tablet Take  325 mg by mouth every morning.    Yes Historical Provider, MD  carvedilol (COREG) 25 MG tablet Take 12.5 mg by mouth 2 (two) times daily with a meal.   Yes Historical Provider, MD  Cholecalciferol (VITAMIN D) 2000 UNITS CAPS Take 2,000 Units by mouth daily.    Yes Historical Provider, MD   clobetasol cream (TEMOVATE) 3.26 % Apply 1 application topically every morning.  02/04/13  Yes Noralee Space, MD  Clobetasol Prop Oint-Coal Tar 0.05 & 2.3 % KIT Apply 1 application topically daily as needed (foot).    Yes Historical Provider, MD  clotrimazole-betamethasone (LOTRISONE) cream Apply to rash two times daily 10/01/13  Yes Noralee Space, MD  furosemide (LASIX) 40 MG tablet Take 40-60 mg by mouth daily. Alternate 1 whole tablet and 1 and 1/2 tab every other day 10/01/13  Yes Noralee Space, MD  gabapentin (NEURONTIN) 300 MG capsule Take 600 mg by mouth at bedtime.   Yes Historical Provider, MD  glipiZIDE (GLUCOTROL XL) 5 MG 24 hr tablet Take 5 mg by mouth 2 (two) times daily.   Yes Historical Provider, MD  HYDROcodone-acetaminophen (NORCO/VICODIN) 5-325 MG per tablet Take a 1/2 to 1 tablet by mouth three times daily as needed for pain.   NOT TO EXCEED 3 PER DAY. 12/06/13  Yes Noralee Space, MD  losartan (COZAAR) 50 MG tablet Take 50 mg by mouth daily.   Yes Historical Provider, MD  metFORMIN (GLUCOPHAGE) 500 MG tablet Take 500 mg by mouth 2 (two) times daily with a meal.   Yes Historical Provider, MD  omeprazole (PRILOSEC) 20 MG capsule Take 1 capsule by mouth 30 minutes before first meal of the day   Yes Historical Provider, MD  potassium chloride SA (K-DUR,KLOR-CON) 20 MEQ tablet Take 10 mEq by mouth daily.   Yes Historical Provider, MD  pravastatin (PRAVACHOL) 40 MG tablet Take 40 mg by mouth daily.   Yes Historical Provider, MD    Allergies:   Allergies  Allergen Reactions  . Celecoxib     GI discomfort  . Diclofenac Sodium     REACTION: causing burining sensation after use    Social History:  Wheelchair bound Lives at   Home with family   reports that she has never smoked. She has never used smokeless tobacco. She reports that she does not drink alcohol or use illicit drugs.   Family History: family history includes Breast cancer in her sister; Diabetes in her mother;  Heart attack in her father; Heart disease in her sister and sister; Liver cancer in her mother; Rheum arthritis in her father.    Physical Exam: Patient Vitals for the past 24 hrs:  BP Pulse Resp SpO2  12/22/13 0230 177/86 mmHg 85 19 94 %  12/22/13 0215 189/107 mmHg 105 20 94 %  12/22/13 0200 221/100 mmHg 113 16 96 %    1. General:  in No Acute distress 2. Psychological: Alert but aphasic 3. Head/ENT:   Moist  Mucous Membranes                          Head Non traumatic, neck supple                          Normal   Dentition 4. SKIN: normal   Skin turgor,  Skin clean Dry and intact no rash 5. Heart: rapid irregular rate and rhythm no Murmur, Rub  or gallop 6. Lungs: occasional wheezes no crackles   7. Abdomen: Soft, non-tender, Non distended 8. Lower extremities: no clubbing, cyanosis, or edema 9. Neurologically Grossly intact, moving all 4 extremities equally 10. MSK: Normal range of motion  body mass index is unknown because there is no weight on file.   Labs on Admission:   Recent Labs  12/22/13 0150  NA 140  K 4.7  CL 95*  CO2 27  GLUCOSE 233*  BUN 35*  CREATININE 1.52*  CALCIUM 9.3   No results found for this basename: AST, ALT, ALKPHOS, BILITOT, PROT, ALBUMIN,  in the last 72 hours No results found for this basename: LIPASE, AMYLASE,  in the last 72 hours  Recent Labs  12/22/13 0150  WBC 11.7*  HGB 14.5  HCT 43.1  MCV 97.1  PLT 162    Recent Labs  12/22/13 0150  TROPONINI <0.30   No results found for this basename: TSH, T4TOTAL, FREET3, T3FREE, THYROIDAB,  in the last 72 hours No results found for this basename: VITAMINB12, FOLATE, FERRITIN, TIBC, IRON, RETICCTPCT,  in the last 72 hours Lab Results  Component Value Date   HGBA1C 8.1* 10/01/2013    The CrCl is unknown because both a height and weight (above a minimum accepted value) are required for this calculation. ABG No results found for this basename: phart, pco2, po2, hco3, tco2,  acidbasedef, o2sat     No results found for this basename: DDIMER     Other results:  I have pearsonaly reviewed this: ECG REPORT  Rate: 123  Rhythm: a.fib w RVR ST&T Change: no ischemic changes   Cultures:    Component Value Date/Time   SDES URINE, RANDOM 01/24/2012 2153   [place] 01/24/2012 2153   CULT ENTEROCOCCUS SPECIES 01/24/2012 2153   REPTSTATUS 01/26/2012 FINAL 01/24/2012 2153       Radiological Exams on Admission: Ct Head Wo Contrast  12/22/2013   CLINICAL DATA:  Code stroke.  Aphasia.  EXAM: CT HEAD WITHOUT CONTRAST  TECHNIQUE: Contiguous axial images were obtained from the base of the skull through the vertex without intravenous contrast.  COMPARISON:  CT of the head performed 10/17/2013  FINDINGS: There is no evidence of acute infarction, mass lesion, or intra- or extra-axial hemorrhage on CT.  Prominence of the ventricles and sulci reflects mild to moderate cortical volume loss. Diffuse periventricular and subcortical white matter change likely reflects small vessel ischemic microangiopathy. Mild cerebellar atrophy is noted.  The brainstem and fourth ventricle are within normal limits. The basal ganglia are unremarkable in appearance. The cerebral hemispheres demonstrate grossly normal gray-white differentiation. No mass effect or midline shift is seen.  There is no evidence of fracture; visualized osseous structures are unremarkable in appearance. The orbits are within normal limits. The paranasal sinuses and mastoid air cells are well-aerated. No significant soft tissue abnormalities are seen.  IMPRESSION: 1. No acute intracranial pathology seen on CT. 2. Mild to moderate cortical volume loss and diffuse small vessel ischemic microangiopathy.  These results were called by telephone at the time of interpretation on 12/22/2013 at 1:30 AM to Dr. Varney Biles , who verbally acknowledged these results.   Electronically Signed   By: Garald Balding M.D.   On: 12/22/2013  01:30   Dg Chest Port 1 View  12/22/2013   CLINICAL DATA:  Weakness; assess for congestive heart failure.  EXAM: PORTABLE CHEST - 1 VIEW  COMPARISON:  Chest radiograph performed 10/17/2013  FINDINGS: The lungs are well-aerated. An  apparent small left pleural effusion is noted, with mild bibasilar atelectasis. No pneumothorax is seen. There is no evidence for pulmonary edema. Mild right hilar prominence is thought to reflect normal vasculature, unchanged from prior studies.  The cardiomediastinal silhouette is borderline normal in size. No acute osseous abnormalities are seen.  IMPRESSION: Apparent small left pleural effusion, with mild bibasilar atelectasis. No evidence for pulmonary edema.   Electronically Signed   By: Garald Balding M.D.   On: 12/22/2013 01:40    Chart has been reviewed  Assessment/Plan  80 yo F w aphasia likely due to CVA  Present on Admission:  . Acute cardioembolic stroke - MRI/MRA, 2 d ECHO, carotid doppler, lipid panel, aspirin 325 mg, PT/OT eval  . Atrial fibrillation with RVR - aspirin 325 pateint not a good candidate for coumadin due to hx of fall and deconditioning.  . DM - SSI hold glipizide . Diastolic dysfunction - would restart lasix in the next 24 hours to avoid fluid overload . HYPERTENSION - avoid over treating in the setting of CVA  Prophylaxis: SCD, Protonix  CODE STATUS: DNR/DNI  Other plan as per orders.  I have spent a total of 55 min on this admission  Nary Sneed 12/22/2013, 3:05 AM

## 2013-12-22 NOTE — Progress Notes (Signed)
Palliative Medicine consult received. We are pleased to assist in the care of this patient and will schedule a goals of care meeting at the earliest possible time we have a provider available.   Anderson MaltaElizabeth Golding, DO Palliative Medicine

## 2013-12-22 NOTE — Code Documentation (Signed)
[age] yo wf [facility].  Pt was walking & talking as normal when she went to bed.  She was later found to be with Rt side weakness, facial droop, and aphasia.  Pt is a DNR and family does not wish for pursuit of aggre

## 2013-12-22 NOTE — ED Notes (Addendum)
DR NANAVATI GIVEN A COPY OF LACTIC ACID RESULTS 4.07

## 2013-12-22 NOTE — ED Provider Notes (Addendum)
CSN: [id number]     Arrival date & time 12/22/13  0108 History   First MD Initiated Contact with Patient 12/22/13 0116     Chief Complaint  Patient presents with  . Code Stroke   (Consider location/radiation/quality/duration/timing/severity/associated sxs/prior Treatment) HPI Comments: LEVEL 5 CAVEAT FOR ALTERED MENTAL STATUS/ APHASIA 80 y.o. female with a past medical history significant for HTN, hypercholesterolemia, chronic congestive heart failure, atrial fibrillation off coumadin due to frequent falls, DJD, brought to [facility] ED with change in mental status. Pt was last normal around 21:15 per family. Pt not verbal any more. No recent infection.  The history is provided by the EMS personnel.    Past Medical History  Diagnosis Date  . Macular degeneration (senile) of retina, unspecified   . Pulmonary nodule   . Unspecified essential hypertension   . Congestive heart failure, unspecified   . Atrial fibrillation   . Unspecified venous (peripheral) insufficiency   . Pure hypercholesterolemia   . GERD (gastroesophageal reflux disease)   . Personal history of unspecified urinary disorder   . DJD (degenerative joint disease)   . Unspecified vitamin D deficiency   . Abnormality of gait   . Anxiety state, unspecified   . Type II or unspecified type diabetes mellitus without mention of complication, not stated as uncontrolled    Past Surgical History  Procedure Laterality Date  . Cholecystectomy  6/07    Dr. Georgette Dover  . Total abdominal hysterectomy    . Bladder surgery    . Total knee arthroplasty  1990    right - Dr. Gladstone Lighter   Family History  Problem Relation Age of Onset  . Rheum arthritis Father   . Breast cancer Sister   . Liver cancer Mother   . Heart attack Father   . Heart disease Sister   . Heart disease Sister   . Diabetes Mother    History  Substance Use Topics  . Smoking status: Never Smoker   . Smokeless tobacco: Never Used  . Alcohol Use: No   OB History    Grav Para Term Preterm Abortions TAB SAB Ect Mult Living                 Review of Systems  Unable to perform ROS: Patient nonverbal    Allergies  Celecoxib and Diclofenac sodium  Home Medications   Current Outpatient Rx  Name  Route  Sig  Dispense  Refill  . acetaminophen (TYLENOL) 325 MG tablet   Oral   Take 650 mg by mouth once. as needed - alternates this with hydrocodone         . aspirin 325 MG tablet   Oral   Take 325 mg by mouth every morning.          . carvedilol (COREG) 25 MG tablet   Oral   Take 12.5 mg by mouth 2 (two) times daily with a meal.         . Cholecalciferol (VITAMIN D) 2000 UNITS CAPS   Oral   Take 2,000 Units by mouth daily.          . clobetasol cream (TEMOVATE) 0.05 %   Topical   Apply 1 application topically every morning.          . Clobetasol Prop Oint-Coal Tar 0.05 & 2.3 % KIT   Apply externally   Apply 1 application topically daily as needed (foot).          . clotrimazole-betamethasone (LOTRISONE) cream  Apply to rash two times daily         . furosemide (LASIX) 40 MG tablet   Oral   Take 40-60 mg by mouth daily. Alternate 1 whole tablet and 1 and 1/2 tab every other day         . gabapentin (NEURONTIN) 300 MG capsule   Oral   Take 600 mg by mouth at bedtime.         Marland Kitchen glipiZIDE (GLUCOTROL XL) 5 MG 24 hr tablet   Oral   Take 5 mg by mouth 2 (two) times daily.         Marland Kitchen HYDROcodone-acetaminophen (NORCO/VICODIN) 5-325 MG per tablet      Take a 1/2 to 1 tablet by mouth three times daily as needed for pain.   NOT TO EXCEED 3 PER DAY.   90 tablet   0   . losartan (COZAAR) 50 MG tablet   Oral   Take 50 mg by mouth daily.         . metFORMIN (GLUCOPHAGE) 500 MG tablet   Oral   Take 500 mg by mouth 2 (two) times daily with a meal.         . omeprazole (PRILOSEC) 20 MG capsule      Take 1 capsule by mouth 30 minutes before first meal of the day         . potassium chloride SA  (K-DUR,KLOR-CON) 20 MEQ tablet   Oral   Take 10 mEq by mouth daily.         . pravastatin (PRAVACHOL) 40 MG tablet   Oral   Take 40 mg by mouth daily.          BP 221/100  Pulse 113  Resp 16  SpO2 96% Physical Exam  Nursing note and vitals reviewed. Constitutional: She appears well-developed.  HENT:  Head: Normocephalic and atraumatic.  Eyes: Conjunctivae are normal.  Neck: Neck supple.  Cardiovascular:  Murmur heard. IRREGULARLY IRREGULAR, AND TACHYCARIDA  Pulmonary/Chest: Effort normal and breath sounds normal.  Abdominal: Soft.  Neurological:  Mild right sided facial droop and right sided upper and lower extremity weakness compared to the left side.    ED Course  Procedures (including critical care time) Labs Review Labs Reviewed  CBC - Abnormal; Notable for the following:    WBC 11.7 (*)    All other components within normal limits  CG4 I-STAT (LACTIC ACID) - Abnormal; Notable for the following:    Lactic Acid, Venous 4.07 (*)    All other components within normal limits  BASIC METABOLIC PANEL  PROTIME-INR  APTT  TROPONIN I  PRO B NATRIURETIC PEPTIDE  HEMOGLOBIN A1C   Imaging Review Ct Head Wo Contrast  12/22/2013   CLINICAL DATA:  Code stroke.  Aphasia.  EXAM: CT HEAD WITHOUT CONTRAST  TECHNIQUE: Contiguous axial images were obtained from the base of the skull through the vertex without intravenous contrast.  COMPARISON:  CT of the head performed 10/17/2013  FINDINGS: There is no evidence of acute infarction, mass lesion, or intra- or extra-axial hemorrhage on CT.  Prominence of the ventricles and sulci reflects mild to moderate cortical volume loss. Diffuse periventricular and subcortical white matter change likely reflects small vessel ischemic microangiopathy. Mild cerebellar atrophy is noted.  The brainstem and fourth ventricle are within normal limits. The basal ganglia are unremarkable in appearance. The cerebral hemispheres demonstrate grossly normal  gray-white differentiation. No mass effect or midline shift is seen.  There is no  evidence of fracture; visualized osseous structures are unremarkable in appearance. The orbits are within normal limits. The paranasal sinuses and mastoid air cells are well-aerated. No significant soft tissue abnormalities are seen.  IMPRESSION: 1. No acute intracranial pathology seen on CT. 2. Mild to moderate cortical volume loss and diffuse small vessel ischemic microangiopathy.  These results were called by telephone at the time of interpretation on 12/22/2013 at 1:30 AM to Dr. Varney Biles , who verbally acknowledged these results.   Electronically Signed   By: Garald Balding M.D.   On: 12/22/2013 01:30   Dg Chest Port 1 View  12/22/2013   CLINICAL DATA:  Weakness; assess for congestive heart failure.  EXAM: PORTABLE CHEST - 1 VIEW  COMPARISON:  Chest radiograph performed 10/17/2013  FINDINGS: The lungs are well-aerated. An apparent small left pleural effusion is noted, with mild bibasilar atelectasis. No pneumothorax is seen. There is no evidence for pulmonary edema. Mild right hilar prominence is thought to reflect normal vasculature, unchanged from prior studies.  The cardiomediastinal silhouette is borderline normal in size. No acute osseous abnormalities are seen.  IMPRESSION: Apparent small left pleural effusion, with mild bibasilar atelectasis. No evidence for pulmonary edema.   Electronically Signed   By: Garald Balding M.D.   On: 12/22/2013 01:40    EKG Interpretation   None       MDM  No diagnosis found.  DDx includes:  Stroke - ischemic vs. hemorrhagic TIA Neuropathy Myelitis Electrolyte abnormality Neuropathy Muscular disease  Pt with hx of afib, htn comes in with change in mentation/unresponsive.  Pt has right sided weakness, is non verbal - appears to be aphasic with left MCA stroke clinically. Pt is DNR, Neuro discussing with family on goals of care.  Pt is hypertensive - last NP is  240/120, and she is tachycardic - 115. Dr. Armida Sans recommends maintaining SBP around 180-190 mmhg.  Will start nicardipine drip with those parameters.  Angiocath insertion Performed by: Varney Biles  Consent: Verbal consent obtained. Risks and benefits: risks, benefits and alternatives were discussed Time out: Immediately prior to procedure a "time out" was called to verify the correct patient, procedure, equipment, support staff and site/side marked as required.  Preparation: Patient was prepped and draped in the usual sterile fashion.  Vein Location: Right External Jugular  Ultrasound Guided  Gauge: 20 gauge  Normal blood return and flush without difficulty Patient tolerance: Patient tolerated the procedure well with no immediate complications.     Varney Biles, MD 12/23/13 [place], MD 12/23/13 [phone]

## 2013-12-22 NOTE — ED Notes (Signed)
Patient LSN at 2115, staff put patient to bed and patient was at her baseline. At 0045 patient began stirring and staff got her up to use the restroom and patient was not able to stand. Patient has right sided weakness and a left facial droop. Patient noted to have shallow RR. Patient is non-verbal at this time, but usually talks at baseline. Patient has hx of a-fib and HTN.

## 2013-12-22 NOTE — Progress Notes (Signed)
PT Cancellation Note  Patient Details Name: [name] MRN: [id number] DOB: Feb 18, 1911   Cancelled Treatment:    Reason Eval/Treat Not Completed: Medical issues which prohibited therapy;Patient not medically ready. Pt on bedrest at this time. Will re-attempt PT evaluation when medically ready.    Donnamarie [place], Brindy Higginbotham [place], [place] [phone] 12/22/2013, 9:53 AM

## 2013-12-22 NOTE — Progress Notes (Signed)
Utilization review completed.  

## 2013-12-22 NOTE — Progress Notes (Signed)
Pt admitted to 3w10 with CVA. Pt is confused, bp is elevated, A-Fib on the monitor, family at the bed side, pt is npo, failed swallow eval. per ed nurse. Admission hx completed with family assistance.Will continue to monitor pt.---Deja Pisarski, rn

## 2013-12-23 DIAGNOSIS — F411 Generalized anxiety disorder: Secondary | ICD-10-CM

## 2013-12-23 DIAGNOSIS — Z515 Encounter for palliative care: Secondary | ICD-10-CM

## 2013-12-23 LAB — LIPID PANEL
CHOL/HDL RATIO: 2.2 ratio
CHOLESTEROL: 150 mg/dL (ref 0–200)
HDL: 67 mg/dL (ref >39)
LDL Cholesterol: 53 mg/dL (ref 0–99)
Triglycerides: 152 mg/dL — ABNORMAL HIGH (ref <150)
VLDL: 30 mg/dL (ref 0–40)

## 2013-12-23 LAB — GLUCOSE, CAPILLARY
Glucose-Capillary: 204 mg/dL — ABNORMAL HIGH (ref 70–99)
Glucose-Capillary: 240 mg/dL — ABNORMAL HIGH (ref 70–99)
Glucose-Capillary: 202 mg/dL — ABNORMAL HIGH (ref 70–99)

## 2013-12-23 MED ORDER — SCOPOLAMINE 1 MG/3DAYS TD PT72: 1.0000 | MEDICATED_PATCH | TRANSDERMAL | Status: DC

## 2013-12-23 MED ORDER — ACETAMINOPHEN 650 MG RE SUPP: 650.0000 mg | RECTAL | Status: DC | PRN

## 2013-12-23 MED ORDER — BISACODYL 10 MG RE SUPP: 10.0000 mg | Freq: Every day | RECTAL | Status: DC | PRN

## 2013-12-23 MED ORDER — SCOPOLAMINE 1 MG/3DAYS TD PT72
1.0000 | MEDICATED_PATCH | TRANSDERMAL | Status: DC
  Administered 2013-12-23: 1.5 mg via TRANSDERMAL
  Filled 2013-12-23: qty 1

## 2013-12-23 MED ORDER — MORPHINE SULFATE 2 MG/ML IJ SOLN
1.0000 mg | INTRAMUSCULAR | Status: DC | PRN
  Administered 2013-12-23 – 2013-12-24 (×5): 1 mg via INTRAVENOUS
  Filled 2013-12-23 (×5): qty 1

## 2013-12-23 MED ORDER — ONDANSETRON HCL 4 MG/2ML IJ SOLN: 4.0000 mg | Freq: Four times a day (QID) | INTRAMUSCULAR | Status: DC | PRN

## 2013-12-23 MED ORDER — LORAZEPAM 0.5 MG PO TABS: 0.5000 mg | ORAL_TABLET | ORAL | Status: DC | PRN

## 2013-12-23 MED ORDER — ATROPINE SULFATE 1 % OP SOLN
4.0000 [drp] | OPHTHALMIC | Status: DC | PRN
  Administered 2013-12-23 (×2): 4 [drp] via SUBLINGUAL
  Filled 2013-12-23: qty 2

## 2013-12-23 MED ORDER — MORPHINE SULFATE (CONCENTRATE) 10 MG /0.5 ML PO SOLN: 10.0000 mg | ORAL | Status: DC | PRN

## 2013-12-23 MED ORDER — LORAZEPAM 2 MG/ML IJ SOLN
1.0000 mg | INTRAMUSCULAR | Status: DC | PRN
  Administered 2013-12-23: 1 mg via INTRAVENOUS
  Filled 2013-12-23: qty 1

## 2013-12-23 MED ORDER — ATROPINE SULFATE 1 % OP SOLN: 4.0000 [drp] | OPHTHALMIC | Status: DC | PRN

## 2013-12-23 MED ORDER — MORPHINE SULFATE 2 MG/ML IJ SOLN: 1.0000 mg | INTRAMUSCULAR | Status: DC | PRN

## 2013-12-23 MED ORDER — LORAZEPAM 2 MG/ML PO CONC: 0.5000 mg | ORAL | Status: DC | PRN

## 2013-12-23 NOTE — Progress Notes (Signed)
Nutrition Brief Note  Patient identified for low Braden score of 12 and on the Malnutrition Screening Tool (MST) Report for unsure of any weight loss. Minimal weight loss of 2% over the past month is insignificant.   Patient admitted for acute cardioembolic stroke with aphasia and right sided hemiparesis. Palliative Care Team has been consulted with plans for a goals of care meeting with family soon. Per review of Physician notes, patient does not want artificial feeding even if she fails upcoming swallow evaluation. Suspect may transition to comfort care.  Wt Readings from Last 15 Encounters:  12/22/13 179 lb 11.2 oz (81.511 kg)  11/13/13 183 lb 9.6 oz (83.28 kg)  07/22/13 186 lb 6.4 oz (84.55 kg)  04/22/13 185 lb 3.2 oz (84.006 kg)  01/21/13 186 lb 3.2 oz (84.46 kg)  07/25/12 173 lb 3.2 oz (78.563 kg)  04/24/12 171 lb (77.565 kg)  02/15/12 172 lb (78.019 kg)  01/30/12 164 lb 1.6 oz (74.435 kg)  10/25/11 170 lb 3.2 oz (77.202 kg)  07/05/11 168 lb 6.4 oz (76.386 kg)  03/01/11 170 lb (77.111 kg)  02/14/11 170 lb 4 oz (77.225 kg)  10/11/10 171 lb 6.1 oz (77.738 kg)  08/18/10 173 lb 4 oz (78.586 kg)    Body mass index is 31.84 kg/(m^2). Patient meets criteria for obesity, class 1 based on current BMI.   Current diet order is NPO, awaiting swallow evaluation for potential diet upgrade. Labs and medications reviewed.   No nutrition interventions warranted at this time. If nutrition issues arise, please consult RD.    Joaquin CourtsKimberly Harris, RD, LDN, CNSC Pager [phone] After Hours Pager [phone]

## 2013-12-23 NOTE — Progress Notes (Signed)
Patient ID: [name]  female  ZOX:[id number]    DOB: 1911/06/05    DOA: 12/22/2013  PCP: Michele McalpineNADEL,SCOTT M, MD  Assessment/Plan: Acute left MCA infarct, acute cardioembolic stroke with aphasia: Right-sided hemiparesis - Neurology was consulted, patient started on aspirin 325 mg daily, stroke workup was initiated. Patient was seen by Dr. Anne HahnWillis is who discussed patient's family in detail, MRA, 2-D echo, carotid Dopplers were discontinued as it would not change the management at this time. MRI of the brain as per family's request was done which showed acute infarct measuring 4.0 x 3.1 x 5.9 cm involving the left frontal lobe with extension into the frontal operculum and  insula - Patient has been n.p.o. for the swallow evaluation. This morning, chest rattling with gurgling sound with secretion her daughter at the bedside, started last night. She also discussed with her sister on phone and given her aphasia, dysphagia with new stroke, total care prior to admission, the family has decided not to pursue any further interventions and follow complete comfort care goals. They are interested in residential hospice.  - I have discontinued tele, placed on morphine as needed for severe pain/distress, Ativan s/l for anxiety, scopolamine patch. Updated Dr Phillips OdorGolding from palliative medicine, social worker and case mgmt.        malignant/accelerated hypertension  -  Was placed on labetalol, metoprolol for BP control  Diabetes mellitus  - now comfort care    atrial fibrillation with RVR  -  currently Comfort Care   DVT Prophylaxis:  Code Status: DNR/DNI , comfort care   Family Communication: discussed in detail with patient's daughter at bed side,   Disposition:     Subjective: Aphasia, awake but not following any verbal commands, rattling and gurgling noticed    Objective: Weight change:   Intake/Output Summary (Last 24 hours) at 12/23/13 1126 Last data filed at 12/23/13 0557  Gross per 24 hour   Intake      0 ml  Output   2625 ml  Net  -2625 ml   Blood pressure 184/121, pulse 111, temperature 97.4 F (36.3 C), temperature source Axillary, resp. rate 36, height 5\' 3"  (1.6 m), weight 81.511 kg (179 lb 11.2 oz), SpO2 96.00%.  Physical Exam: General: awake but aphasia, opens eyes to commands  CVS: S1-S2 clear, no murmur rubs or gallops Chest: b/l diffuse rhonchi  Abdomen: soft  ND, NBS Extremities: no c/c/e Neuro does not follow any commands  Lab Results: Basic Metabolic Panel:  Recent Labs Lab 12/22/13 0150  NA 140  K 4.7  CL 95*  CO2 27  GLUCOSE 233*  BUN 35*  CREATININE 1.52*  CALCIUM 9.3   CBC:  Recent Labs Lab 12/22/13 0150  WBC 11.7*  HGB 14.5  HCT 43.1  MCV 97.1  PLT 162   Cardiac Enzymes:  Recent Labs Lab 12/22/13 0150  TROPONINI <0.30   BNP: No components found with this basename: POCBNP,  CBG:  Recent Labs Lab 12/22/13 0816 12/22/13 1153 12/22/13 1652 12/22/13 2331 12/23/13 0517  GLUCAP 145* 180* 186* 198* 204*     Micro Results: No results found for this or any previous visit (from the past 240 hour(s)).  Studies/Results: Ct Head Wo Contrast  12/22/2013   CLINICAL DATA:  Code stroke.  Aphasia.  EXAM: CT HEAD WITHOUT CONTRAST  TECHNIQUE: Contiguous axial images were obtained from the base of the skull through the vertex without intravenous contrast.  COMPARISON:  CT of the head performed 10/17/2013  FINDINGS:  There is no evidence of acute infarction, mass lesion, or intra- or extra-axial hemorrhage on CT.  Prominence of the ventricles and sulci reflects mild to moderate cortical volume loss. Diffuse periventricular and subcortical white matter change likely reflects small vessel ischemic microangiopathy. Mild cerebellar atrophy is noted.  The brainstem and fourth ventricle are within normal limits. The basal ganglia are unremarkable in appearance. The cerebral hemispheres demonstrate grossly normal gray-white differentiation. No  mass effect or midline shift is seen.  There is no evidence of fracture; visualized osseous structures are unremarkable in appearance. The orbits are within normal limits. The paranasal sinuses and mastoid air cells are well-aerated. No significant soft tissue abnormalities are seen.  IMPRESSION: 1. No acute intracranial pathology seen on CT. 2. Mild to moderate cortical volume loss and diffuse small vessel ischemic microangiopathy.  These results were called by telephone at the time of interpretation on 12/22/2013 at 1:30 AM to Dr. Derwood Kaplan , who verbally acknowledged these results.   Electronically Signed   By: Roanna Raider M.D.   On: 12/22/2013 01:30   Dg Chest Port 1 View  12/22/2013   CLINICAL DATA:  Weakness; assess for congestive heart failure.  EXAM: PORTABLE CHEST - 1 VIEW  COMPARISON:  Chest radiograph performed 10/17/2013  FINDINGS: The lungs are well-aerated. An apparent small left pleural effusion is noted, with mild bibasilar atelectasis. No pneumothorax is seen. There is no evidence for pulmonary edema. Mild right hilar prominence is thought to reflect normal vasculature, unchanged from prior studies.  The cardiomediastinal silhouette is borderline normal in size. No acute osseous abnormalities are seen.  IMPRESSION: Apparent small left pleural effusion, with mild bibasilar atelectasis. No evidence for pulmonary edema.   Electronically Signed   By: Roanna Raider M.D.   On: 12/22/2013 01:40    Medications: Scheduled Meds: . aspirin  300 mg Rectal Daily   Or  . aspirin  325 mg Oral Daily  . insulin aspart  0-9 Units Subcutaneous Q4H  . metoprolol  5 mg Intravenous Q6H  . scopolamine  1 patch Transdermal Q72H      LOS: 1 day   RAI,RIPUDEEP M.D. [facility] 12/23/2013, 11:26 AM Pager: [phone]  If 7PM-7AM, please contact night-coverage [email or web address] Password [email or web address]

## 2013-12-23 NOTE — Progress Notes (Signed)
OT Cancellation Note  Patient Details Name: [name] MRN: [id number] DOB: 1911-03-21   Cancelled Treatment:    Reason Eval/Treat Not Completed: Other (comment). Await goals of care meeting before proceeding with OT eval.  Evette GeorgesLeonard, Pedrohenrique [place] Eva [phone] 12/23/2013, 7:28 AM

## 2013-12-23 NOTE — Progress Notes (Signed)
SLP Cancellation Note  Patient Details Name: [name] MRN: [id number] DOB: 28-Aug-1911   Cancelled treatment:        Order received for swallow evaluation.  Chart reviewed and entered room for the eval.  Family member at bedside reported that MD stated there was no need for a swallow eval at this point.  Discussed with Dr. Isidoro Donningai, who agrees and will cancel order.  Pt. Is now Comfort Care per MD.   Maryjo RochesterWillis, Gregoria Selvy T 12/23/2013, 9:55 AM

## 2013-12-23 NOTE — Care Management Note (Signed)
    Page 1 of 1   12/23/2013     4:20:28 PM   CARE MANAGEMENT NOTE 12/23/2013  Patient:  [name],[name]   Account Number:  [id number]  Date Initiated:  12/23/2013  Documentation initiated by:  GRAVES-BIGELOW,Bana Borgmeyer  Subjective/Objective Assessment:   Pt admitted for Acute cardioembolic stroke with aphasia: Right-sided hemiparesis. DNR noted. Per Palliative Care- Pt appropriate for GIP.     Action/Plan:   CM did call [facility] earlier in the day- was told had to offer Residential Facility. Per CS plan is for Residential Hospice in am. [facility]. No further needs from CM at this time.   Anticipated DC Date:  12/18/2013   Anticipated DC Plan:  HOSPICE MEDICAL FACILITY  In-house referral  Clinical Social Worker      DC Planning Services  CM consult      Choice offered to / List presented to:             Status of service:  Completed, signed off Medicare Important Message given?   (If response is "NO", the following Medicare IM given date fields will be blank) Date Medicare IM given:   Date Additional Medicare IM given:    Discharge Disposition:  HOSPICE MEDICAL FACILITY  Per UR Regulation:  Reviewed for med. necessity/level of care/duration of stay  If discussed at Long Length of Stay Meetings, dates discussed:    Comments:

## 2013-12-23 NOTE — Progress Notes (Signed)
Stroke Team Progress Note  HISTORY [name] is an [age] y.o. female with a past medical history significant for HTN, hypercholesterolemia, chronic congestive heart failure, atrial fibrillation off coumadin due to frequent falls, DJD, [facility] by ambulance as a code stroke due to the above stated constellation of symptoms/signs.  She lives home with a daughter and was last known well at 9 pm last night. Then, around 1245 am her daughter noticed that she was struggling trying to get out of bed and she couldn't do it.  Daughter said that her mother was less responsive, was not able to talk, and her right side was weaker than the left.  EMS was summoned and they confirmed that she was unable to speak and had right sided weakness with right face droop. Initial SBP>230.  Her initial NIHSS was 21.  CT brain showed no acute abnormality.   Date last known well:  Time last known well:  tPA Given: no, out of the window for IV tpa  NIHSS: 21  MRS: 4   SUBJECTIVE Family members at the bedside. Dr. Pearlean BrownieSethi had a long discussion with the family regarding the patient's poor prognosis. Palliative care was discussed. The patient is essentially unresponsive. The family would like comfort care.  OBJECTIVE Most recent Vital Signs: Filed Vitals:   12/23/13 0512 12/23/13 0742 12/23/13 0744 12/23/13 1235  BP: 202/123 184/121  193/124  Pulse: 115 111  108  Temp: 101.4 F (38.6 C) 97.4 F (36.3 C)  97.6 F (36.4 C)  TempSrc: Axillary Axillary  Axillary  Resp: 22 36  26  Height:      Weight:      SpO2: 95% 92% 96% 95%   CBG (last 3)   Recent Labs  12/23/13 0517 12/23/13 0758 12/23/13 1133  GLUCAP 204* 240* 202*    IV Fluid Intake:   . sodium chloride 20 mL/hr at 12/23/13 1144    MEDICATIONS  . aspirin  300 mg Rectal Daily   Or  . aspirin  325 mg Oral Daily  . scopolamine  1 patch Transdermal Q72H   PRN:  acetaminophen, acetaminophen, atropine, bisacodyl, labetalol, LORazepam,  morphine injection, ondansetron (ZOFRAN) IV  Diet:     Activity:  Bedrest DVT Prophylaxis:  SCD  CLINICALLY SIGNIFICANT STUDIES Basic Metabolic Panel:   Recent Labs Lab 12/22/13 0150  NA 140  K 4.7  CL 95*  CO2 27  GLUCOSE 233*  BUN 35*  CREATININE 1.52*  CALCIUM 9.3   Liver Function Tests: No results found for this basename: AST, ALT, ALKPHOS, BILITOT, PROT, ALBUMIN,  in the last 168 hours CBC:   Recent Labs Lab 12/22/13 0150  WBC 11.7*  HGB 14.5  HCT 43.1  MCV 97.1  PLT 162   Coagulation:   Recent Labs Lab 12/22/13 0150  LABPROT 12.2  INR 0.92   Cardiac Enzymes:   Recent Labs Lab 12/22/13 0150  TROPONINI <0.30   Urinalysis:   Recent Labs Lab 12/22/13 0328  COLORURINE YELLOW  LABSPEC 1.013  PHURINE 6.0  GLUCOSEU NEGATIVE  HGBUR NEGATIVE  BILIRUBINUR NEGATIVE  KETONESUR NEGATIVE  PROTEINUR NEGATIVE  UROBILINOGEN 0.2  NITRITE NEGATIVE  LEUKOCYTESUR NEGATIVE   Lipid Panel    Component Value Date/Time   CHOL 150 12/23/2013 0050   TRIG 152* 12/23/2013 0050   HDL 67 12/23/2013 0050   CHOLHDL 2.2 12/23/2013 0050   VLDL 30 12/23/2013 0050   LDLCALC 53 12/23/2013 0050   HgbA1C  Lab Results  Component Value Date   HGBA1C 7.1* 12/22/2013    Urine Drug Screen:   No results found for this basename: labopia,  cocainscrnur,  labbenz,  amphetmu,  thcu,  labbarb    Alcohol Level: No results found for this basename: ETH,  in the last 168 hours  Ct Head Wo Contrast 12/22/2013    1. No acute intracranial pathology seen on CT.  2. Mild to moderate cortical volume loss and diffuse small vessel ischemic microangiopathy.      Mr Brain Wo Contrast 12/22/2013     Acute left MCA infarct.     Dg Chest Port 1 View 12/22/2013    Apparent small left pleural effusion, with mild bibasilar atelectasis. No evidence for pulmonary edema.     2D Echocardiogram  Canceled  Carotid Doppler  Canceled   CXR   Apparent small left pleural effusion, with mild  bibasilar  atelectasis. No evidence for pulmonary edema.   EKG   Atrial fibrillation Low voltage QRS Inferior infarct , age undetermined T wave abnormality, consider lateral ischemia  Therapy Recommendations - Treatments canceled due to the patient's inability to cooperate.  Physical Exam  General: The patient is alert, but she is unable to follow commands  Skin: 2 plus edema is noted in the legs below the knees.   Neurologic Exam  Mental status: The patient is aphasic, essentially mute.  Cranial nerves: Facial symmetry is present. The patient is unable to speak. Eyes are midposition, she will blink to threat bilaterally.  Motor: The patient is not moving either leg well, mild drift on the right arm, able to hold both arms above the head.  Sensory examination: Minimal response to pain stimulation on all fours.  Coordination: The patient is unable to follow commands for cerebellar testing.  Gait and station: The patient gait was not tested.  Reflexes: Deep tendon reflexes are symmetric.    ASSESSMENT [name] is a 80 y.o. female presenting with aphasia. History of Afib, on aspirin prior to coming into the hospital. I discussed the care with the family. The patient was total care PTA, with a developing dementia. They prefer comfort care.   Afib  Mr Brain Wo Contrast 12/22/2013 acute left middle cerebral artery infarct  Dementia  DM  CHF  Dyslipidemia  DJD  Hospital day # 1  TREATMENT/PLAN  Continue aspirin  Doppler, 2 D echo, and  MRA head D/Cd  DNR comfort care  We will sign off at this time. Please call if we can be of further assistance.  Delton See PA-C [facility] Pager [phone] 12/23/2013, 2:40 PM  I have personally examined this patient, reviewed data, developed plan of care and answered questions. Delia Heady, MD

## 2013-12-23 NOTE — Progress Notes (Signed)
CSW offered residential hospice choice to the family. [facility] are [facility]. [facility] will have a bed available 12/19/2013. CSW made a referral to [facility]. CSW will follow up in the am.  Sabino NiemannAmy Teague Goynes, MSW, [employer]WA [phone]

## 2013-12-23 NOTE — Discharge Summary (Signed)
Physician Discharge Summary  Patient ID: [name] MRN: [id number] DOB/AGE: [birth year] 78 y.o.  Admit date: 12/22/2013 Discharge date: 01/03/2014  Primary Care Physician:  Noralee Space, MD  Discharge Diagnoses:    . Acute cardioembolic stroke . Atrial fibrillation with RVR . DM . Diastolic dysfunction . HYPERTENSION . CVA (cerebral infarction) Dysphagia  Consults: Neurology/stroke service                   Palliative medicine   Recommendations for Outpatient Follow-up:  DO NOT RESUSCITATE, DO NOT INTUBATE, patient is comfort care  Allergies:   Allergies  Allergen Reactions  . Celecoxib     GI discomfort  . Diclofenac Sodium     REACTION: causing burining sensation after use     Discharge Medications:   Medication List    STOP taking these medications       acetaminophen 325 MG tablet  Commonly known as:  TYLENOL  Replaced by:  acetaminophen 650 MG suppository     aspirin 325 MG tablet     carvedilol 25 MG tablet  Commonly known as:  COREG     clobetasol cream 0.05 %  Commonly known as:  TEMOVATE     Clobetasol Prop Oint-Coal Tar 0.05 & 2.3 % Kit     clotrimazole-betamethasone cream  Commonly known as:  LOTRISONE     furosemide 40 MG tablet  Commonly known as:  LASIX     gabapentin 300 MG capsule  Commonly known as:  NEURONTIN     glipiZIDE 5 MG 24 hr tablet  Commonly known as:  GLUCOTROL XL     HYDROcodone-acetaminophen 5-325 MG per tablet  Commonly known as:  NORCO/VICODIN     losartan 50 MG tablet  Commonly known as:  COZAAR     metFORMIN 500 MG tablet  Commonly known as:  GLUCOPHAGE     omeprazole 20 MG capsule  Commonly known as:  PRILOSEC     potassium chloride SA 20 MEQ tablet  Commonly known as:  K-DUR,KLOR-CON     pravastatin 40 MG tablet  Commonly known as:  PRAVACHOL     Vitamin D 2000 UNITS Caps      TAKE these medications       acetaminophen 650 MG suppository  Commonly known as:  TYLENOL  Place 1 suppository  (650 mg total) rectally every 4 (four) hours as needed for mild pain (or temp >/= 99.5 F).     atropine 1 % ophthalmic solution  Place 4 drops under the tongue every 4 (four) hours as needed (terminal secretions).     bisacodyl 10 MG suppository  Commonly known as:  DULCOLAX  Place 1 suppository (10 mg total) rectally daily as needed for moderate constipation.     morphine CONCENTRATE 10 mg / 0.5 ml concentrated solution  Place 0.5 mLs (10 mg total) under the tongue every 3 (three) hours as needed for severe pain or shortness of breath.     scopolamine 1.5 MG  Commonly known as:  TRANSDERM-SCOP  Place 1 patch (1.5 mg total) onto the skin every 3 (three) days.         Brief H and P: For complete details please refer to admission H and P, but in brief patient is 80 year old female who presented with aphasia. Apparently at 12:15 AM, patient was noted trying to go to the bathroom She was noted unable to sit up. Family were able to get her to the bathroom few min later she  became aphasic. EMS was called she was noted to have facial droop patient was brought as code stroke. CT scan did not show any acute findings. Neurology was called, patient was not considered t-PA candidate.  Hospital Course:  Acute left MCA infarct, acute cardioembolic stroke with aphasia: Right-sided hemiparesis  - Neurology was consulted, patient started on aspirin 325 mg daily, stroke workup was initiated. Patient was seen by Dr. Anne Hahn is who discussed patient's family in detail, MRA, 2-D echo, carotid Dopplers were discontinued as it would not change the management at this time. MRI of the brain as per family's request was done which showed acute infarct measuring 4.0 x 3.1 x 5.9 cm involving the left frontal lobe with extension into the frontal operculum and  insula  - Patient had been n.p.o. for the swallow evaluation. However patient's overall condition started deteriorating and given her aphasia, dysphagia with new  stroke, total care prior to admission, the family has decided not to pursue any further interventions and follow complete comfort care goals. Patient was placed on morphine as needed for severe pain/distress, Ativan s/l for anxiety, scopolamine patch.  malignant/accelerated hypertension  - Was placed on labetalol, metoprolol for BP control, however currently Comfort Care goals Diabetes mellitus  - now comfort care  atrial fibrillation with RVR  - currently Comfort Care   Patient will be transferred to inpatient palliative care, under Dr. Phillips Odor.   Day of Discharge BP 193/124  Pulse 108  Temp(Src) 97.6 F (36.4 C) (Axillary)  Resp 26  Ht 5\' 3"  (1.6 m)  Wt 81.511 kg (179 lb 11.2 oz)  BMI 31.84 kg/m2  SpO2 95%  Physical Exam:  General: aphasia, opens eyes spontaneously but no meaningful response CVS: S1-S2 clear Chest: b/l diffuse rhonchi  Abdomen: soft ND, NBS  Extremities: no c/c/e  Neuro does not follow any commands   The results of significant diagnostics from this hospitalization (including imaging, microbiology, ancillary and laboratory) are listed below for reference.    LAB RESULTS: Basic Metabolic Panel:  Recent Labs Lab 12/22/13 0150  NA 140  K 4.7  CL 95*  CO2 27  GLUCOSE 233*  BUN 35*  CREATININE 1.52*  CALCIUM 9.3   Liver Function Tests: No results found for this basename: AST, ALT, ALKPHOS, BILITOT, PROT, ALBUMIN,  in the last 168 hours No results found for this basename: LIPASE, AMYLASE,  in the last 168 hours No results found for this basename: AMMONIA,  in the last 168 hours CBC:  Recent Labs Lab 12/22/13 0150  WBC 11.7*  HGB 14.5  HCT 43.1  MCV 97.1  PLT 162   Cardiac Enzymes:  Recent Labs Lab 12/22/13 0150  TROPONINI <0.30   BNP: No components found with this basename: POCBNP,  CBG:  Recent Labs Lab 12/23/13 0758 12/23/13 1133  GLUCAP 240* 202*    Significant Diagnostic Studies:  Ct Head Wo Contrast  12/22/2013    CLINICAL DATA:  Code stroke.  Aphasia.  EXAM: CT HEAD WITHOUT CONTRAST  TECHNIQUE: Contiguous axial images were obtained from the base of the skull through the vertex without intravenous contrast.  COMPARISON:  CT of the head performed 10/17/2013  FINDINGS: There is no evidence of acute infarction, mass lesion, or intra- or extra-axial hemorrhage on CT.  Prominence of the ventricles and sulci reflects mild to moderate cortical volume loss. Diffuse periventricular and subcortical white matter change likely reflects small vessel ischemic microangiopathy. Mild cerebellar atrophy is noted.  The brainstem and fourth ventricle are  within normal limits. The basal ganglia are unremarkable in appearance. The cerebral hemispheres demonstrate grossly normal gray-white differentiation. No mass effect or midline shift is seen.  There is no evidence of fracture; visualized osseous structures are unremarkable in appearance. The orbits are within normal limits. The paranasal sinuses and mastoid air cells are well-aerated. No significant soft tissue abnormalities are seen.  IMPRESSION: 1. No acute intracranial pathology seen on CT. 2. Mild to moderate cortical volume loss and diffuse small vessel ischemic microangiopathy.  These results were called by telephone at the time of interpretation on 12/22/2013 at 1:30 AM to Dr. Varney Biles , who verbally acknowledged these results.   Electronically Signed   By: Garald Balding M.D.   On: 12/22/2013 01:30   Mr Brain Wo Contrast  12/22/2013   CLINICAL DATA:  A phased and facial droop.  Evaluate for stroke.  EXAM: MRI HEAD WITHOUT CONTRAST  TECHNIQUE: Multiplanar, multiecho pulse sequences of the brain and surrounding structures were obtained without intravenous contrast.  COMPARISON:  Head CT 12/22/2013  FINDINGS: Images are moderately degraded by motion artifact.  There is an acute infarct measuring 4.0 x 3.1 x 5.9 cm involving the left frontal lobe with extension into the frontal  operculum and insula. There is no evidence of intracranial hemorrhage. There is no mass, midline shift, or extra-axial fluid collection. There is moderate cerebral atrophy. Small, remote right basal ganglia and left cerebellar infarcts are noted. Major intracranial vascular flow voids are unremarkable. Prior bilateral cataract surgery is noted. Paranasal sinuses and mastoid air cells are clear.  IMPRESSION: Acute left MCA infarct.  These results were called by telephone at the time of interpretation on 12/22/2013 at 4:24 PM to Dr. Doy Mince, who verbally acknowledged these results.   Electronically Signed   By: Logan Bores   On: 12/22/2013 16:24   Dg Chest Port 1 View  12/22/2013   CLINICAL DATA:  Weakness; assess for congestive heart failure.  EXAM: PORTABLE CHEST - 1 VIEW  COMPARISON:  Chest radiograph performed 10/17/2013  FINDINGS: The lungs are well-aerated. An apparent small left pleural effusion is noted, with mild bibasilar atelectasis. No pneumothorax is seen. There is no evidence for pulmonary edema. Mild right hilar prominence is thought to reflect normal vasculature, unchanged from prior studies.  The cardiomediastinal silhouette is borderline normal in size. No acute osseous abnormalities are seen.  IMPRESSION: Apparent small left pleural effusion, with mild bibasilar atelectasis. No evidence for pulmonary edema.   Electronically Signed   By: Garald Balding M.D.   On: 12/22/2013 01:40       Disposition and Follow-up:  Future Appointments Provider Department Dept Phone   01/10/2014 12:00 PM Noralee Space, MD [facility] [phone]   01/28/2014 3:00 PM Noralee Space, MD [facility] [phone]       DISPOSITION: Inpatient hospice/ GIP       Time spent on Discharge: 35 mins  Signed:   RAI,RIPUDEEP M.D. [facility] 12/13/2013, 11:56 AM Pager: [phone]

## 2013-12-23 NOTE — Consult Note (Signed)
Patient [name]      DOB: Nov 26, 1911      LKT:[id number]     Consult Note from the Palliative Medicine Team at [facility] Requested by: Dr. Tana Coast     PCP: Jacqueline Space, MD Reason for Consultation: Clarification of [place] and options.  Phone [phone]  Assessment of patients Current state: 80 yo female with acute stroke and now unresponsive. Family has already spoken with many physicians at length and are desiring full comfort care. I met with her two daughters [name] (and her husband) and [name] about goals. They are clear that they understand she is dying and they want her to be made comfortable. We discussed IV fluids, lab draws, and insulin/CBG checks and they said to stop anything that is not contributing to her comfort. We discussed that lab values and blood sugars are not normal when people are in the process of dying and they were very understanding and agreed they should be stopped.   [name] was living with [name] and her husband and [name] was next door and they took shifts to care for her so she would never be put in a nursing facility. Caring for her was getting more and more difficult in the past few months and she went from being able to get to the restroom herself with a walker to needing help and it becoming a 30 minute process. They said that she has had a good life. 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   I noticed long pauses in her respirations during my assessment. She has increased terminal secretions and feet are beginning to mottle. I am concerned about transport to a residential hospice. Her family says that they have noticed a dramatic decline overnight. We will continue to support this patient and family holistically.    Goals of Care: 1.  Code Status: DNR   2. Scope of Treatment: 1. Vital Signs: daily 2. Respiratory/Oxygen: for comfort  3. Nutritional Support/Tube  Feeds: no 4. Antibiotics: no 5. Blood Products: no 6. IVF: KVO 7. Review of Medications to be discontinued: minimize for comfort 8. Labs: no 9. Telemetry: no 10. Consults: palliative   4. Disposition: GIP vs residential hospice.    3. Symptom Management:   1. Anxiety/Agitation: Ativan prn 2. Pain: Morphine prn.  3. Bowel Regimen: Dulcolax supp prn.  4. Fever: Acetaminophen prn.  5. Nausea/Vomiting: Ondansetron prn.  6. Terminal Secretions: Scopolamine in place and Atropine SL prn.   4. Psychosocial: Emotional support provided to family throughout conversation.    Brief HPI: 80 yo female with acute stroke.   ROS: Unable to elicit - unresponsive.     PMH:  Past Medical History  Diagnosis Date  . Macular degeneration (senile) of retina, unspecified   . Pulmonary nodule   . Unspecified essential hypertension   . Congestive heart failure, unspecified   . Atrial fibrillation   . Unspecified venous (peripheral) insufficiency   . Pure hypercholesterolemia   . GERD (gastroesophageal reflux disease)   . Personal history of unspecified urinary disorder   . DJD (degenerative joint disease)   . Unspecified vitamin D deficiency   . Abnormality of gait   . Anxiety state, unspecified   . Type II or unspecified type diabetes mellitus without mention of complication, not stated as uncontrolled      PSH: Past Surgical History  Procedure Laterality Date  .  Cholecystectomy  6/07    Dr. Georgette Dover  . Total abdominal hysterectomy    . Bladder surgery    . Total knee arthroplasty  1990    right - Dr. Gladstone Lighter   I have reviewed the [place] and SH and  If appropriate update it with new information. Allergies  Allergen Reactions  . Celecoxib     GI discomfort  . Diclofenac Sodium     REACTION: causing burining sensation after use   Scheduled Meds: . aspirin  300 mg Rectal Daily   Or  . aspirin  325 mg Oral Daily  . scopolamine  1 patch Transdermal Q72H   Continuous  Infusions: . sodium chloride 20 mL/hr at 12/23/13 1144   PRN Meds:.acetaminophen, acetaminophen, atropine, labetalol, LORazepam, morphine injection    BP 184/121  Pulse 111  Temp(Src) 97.4 F (36.3 C) (Axillary)  Resp 36  Ht 5' 3"  (1.6 m)  Wt 81.511 kg (179 lb 11.2 oz)  BMI 31.84 kg/m2  SpO2 96%   PPS: 10%   Intake/Output Summary (Last 24 hours) at 12/23/13 1158 Last data filed at 12/23/13 0557  Gross per 24 hour  Intake      0 ml  Output   2625 ml  Net  -2625 ml   LBM: 12/23/13             Physical Exam:  General: NAD, ill appearing, actively dying HEENT:  [place]/AT, no JVD Chest: Rhonchi/coarse throughout, long pauses intermittently  CVS: RRR, S1 S2 Abdomen: Soft, NT, ND, hypoactive BS Ext: Rt hemiparesis, slight mottling toes Neuro: Unresponsive  Labs: CBC    Component Value Date/Time   WBC 11.7* 12/22/2013 0150   RBC 4.44 12/22/2013 0150   HGB 14.5 12/22/2013 0150   HCT 43.1 12/22/2013 0150   PLT 162 12/22/2013 0150   MCV 97.1 12/22/2013 0150   MCH 32.7 12/22/2013 0150   MCHC 33.6 12/22/2013 0150   RDW 14.3 12/22/2013 0150   LYMPHSABS 1.8 10/17/2013 1123   MONOABS 0.6 10/17/2013 1123   EOSABS 0.1 10/17/2013 1123   BASOSABS 0.0 10/17/2013 1123    BMET    Component Value Date/Time   NA 140 12/22/2013 0150   K 4.7 12/22/2013 0150   CL 95* 12/22/2013 0150   CO2 27 12/22/2013 0150   GLUCOSE 233* 12/22/2013 0150   BUN 35* 12/22/2013 0150   CREATININE 1.52* 12/22/2013 0150   CALCIUM 9.3 12/22/2013 0150   GFRNONAA 27* 12/22/2013 0150   GFRAA 31* 12/22/2013 0150    CMP     Component Value Date/Time   NA 140 12/22/2013 0150   K 4.7 12/22/2013 0150   CL 95* 12/22/2013 0150   CO2 27 12/22/2013 0150   GLUCOSE 233* 12/22/2013 0150   BUN 35* 12/22/2013 0150   CREATININE 1.52* 12/22/2013 0150   CALCIUM 9.3 12/22/2013 0150   PROT 7.3 10/17/2013 1123   ALBUMIN 3.1* 10/17/2013 1123   AST 27 10/17/2013 1123   ALT 20 10/17/2013 1123   ALKPHOS 90 10/17/2013 1123   BILITOT 0.5  10/17/2013 1123   GFRNONAA 27* 12/22/2013 0150   GFRAA 31* 12/22/2013 0150     Time In Time Out Total Time Spent with Patient Total Overall Time  1115 1215 49mn 629m    Greater than 50%  of this time was spent counseling and coordinating care related to the above assessment and plan.  AlVinie SillNP Palliative Medicine Team Team Phone # [phone]

## 2013-12-24 ENCOUNTER — Inpatient Hospital Stay (HOSPITAL_COMMUNITY)
Admission: AD | Admit: 2013-12-24 | Discharge: 2014-01-05 | DRG: 064 | Disposition: E | Source: Ambulatory Visit | Attending: Internal Medicine | Admitting: Internal Medicine

## 2013-12-24 DIAGNOSIS — E7800 Pure hypercholesterolemia, unspecified: Secondary | ICD-10-CM | POA: Diagnosis present

## 2013-12-24 DIAGNOSIS — Z833 Family history of diabetes mellitus: Secondary | ICD-10-CM

## 2013-12-24 DIAGNOSIS — E119 Type 2 diabetes mellitus without complications: Secondary | ICD-10-CM | POA: Diagnosis present

## 2013-12-24 DIAGNOSIS — Z800 Family history of malignant neoplasm of digestive organs: Secondary | ICD-10-CM

## 2013-12-24 DIAGNOSIS — G8190 Hemiplegia, unspecified affecting unspecified side: Secondary | ICD-10-CM | POA: Diagnosis present

## 2013-12-24 DIAGNOSIS — Z515 Encounter for palliative care: Secondary | ICD-10-CM

## 2013-12-24 DIAGNOSIS — R29810 Facial weakness: Secondary | ICD-10-CM | POA: Diagnosis present

## 2013-12-24 DIAGNOSIS — I6340 Cerebral infarction due to embolism of unspecified cerebral artery: Principal | ICD-10-CM | POA: Diagnosis present

## 2013-12-24 DIAGNOSIS — R1310 Dysphagia, unspecified: Secondary | ICD-10-CM | POA: Diagnosis present

## 2013-12-24 DIAGNOSIS — Z96659 Presence of unspecified artificial knee joint: Secondary | ICD-10-CM

## 2013-12-24 DIAGNOSIS — H3530 Unspecified macular degeneration: Secondary | ICD-10-CM | POA: Diagnosis present

## 2013-12-24 DIAGNOSIS — I639 Cerebral infarction, unspecified: Secondary | ICD-10-CM | POA: Diagnosis present

## 2013-12-24 DIAGNOSIS — J9600 Acute respiratory failure, unspecified whether with hypoxia or hypercapnia: Secondary | ICD-10-CM | POA: Diagnosis present

## 2013-12-24 DIAGNOSIS — Z803 Family history of malignant neoplasm of breast: Secondary | ICD-10-CM

## 2013-12-24 DIAGNOSIS — I10 Essential (primary) hypertension: Secondary | ICD-10-CM | POA: Diagnosis present

## 2013-12-24 DIAGNOSIS — I6350 Cerebral infarction due to unspecified occlusion or stenosis of unspecified cerebral artery: Secondary | ICD-10-CM

## 2013-12-24 DIAGNOSIS — Z8249 Family history of ischemic heart disease and other diseases of the circulatory system: Secondary | ICD-10-CM

## 2013-12-24 DIAGNOSIS — I509 Heart failure, unspecified: Secondary | ICD-10-CM | POA: Diagnosis present

## 2013-12-24 DIAGNOSIS — M1990 Unspecified osteoarthritis, unspecified site: Secondary | ICD-10-CM | POA: Diagnosis present

## 2013-12-24 DIAGNOSIS — I4891 Unspecified atrial fibrillation: Secondary | ICD-10-CM | POA: Diagnosis present

## 2013-12-24 MED ORDER — LORAZEPAM 2 MG/ML IJ SOLN: 0.5000 mg | INTRAMUSCULAR | Status: DC | PRN

## 2013-12-24 MED ORDER — SODIUM CHLORIDE 0.9 % IV SOLN: 250.0000 mL | INTRAVENOUS | Status: DC | PRN

## 2013-12-24 MED ORDER — MORPHINE SULFATE 2 MG/ML IJ SOLN
1.0000 mg | INTRAMUSCULAR | Status: DC | PRN
  Administered 2013-12-25: 2 mg via INTRAVENOUS
  Filled 2013-12-24: qty 1

## 2013-12-24 MED ORDER — SCOPOLAMINE 1 MG/3DAYS TD PT72
1.0000 | MEDICATED_PATCH | TRANSDERMAL | Status: DC
  Administered 2013-12-24: 1.5 mg via TRANSDERMAL
  Filled 2013-12-24: qty 1

## 2013-12-24 MED ORDER — POLYVINYL ALCOHOL 1.4 % OP SOLN
1.0000 [drp] | Freq: Four times a day (QID) | OPHTHALMIC | Status: DC | PRN
Start: 2013-12-24 — End: 2013-12-25

## 2013-12-24 MED ORDER — ALBUTEROL SULFATE (2.5 MG/3ML) 0.083% IN NEBU: 2.5000 mg | INHALATION_SOLUTION | RESPIRATORY_TRACT | Status: DC | PRN

## 2013-12-24 MED ORDER — ATROPINE SULFATE 1 % OP SOLN
4.0000 [drp] | OPHTHALMIC | Status: DC | PRN
  Administered 2013-12-25: 4 [drp] via SUBLINGUAL

## 2013-12-24 MED ORDER — SODIUM CHLORIDE 0.9 % IV SOLN
1.0000 mg/h | INTRAVENOUS | Status: DC
  Administered 2013-12-25: 1 mg/h via INTRAVENOUS
  Filled 2013-12-24: qty 10

## 2013-12-24 MED ORDER — SODIUM CHLORIDE 0.9 % IJ SOLN: 3.0000 mL | INTRAMUSCULAR | Status: DC | PRN

## 2013-12-24 MED ORDER — SODIUM CHLORIDE 0.9 % IJ SOLN: 3.0000 mL | Freq: Two times a day (BID) | INTRAMUSCULAR | Status: DC

## 2013-12-24 NOTE — Care Management Note (Signed)
0940 10/18/2014 CM did call [facility] and spoke to [place] [facility] Jacqueline JohnsKathleen stated that her supervisor will have to give CM a phone call back due to she was working on the case. CM did reach out to family and spoke to daughter [name]. She was under the impression that the pt was already being followed by GIP with [facility]. CM did explain the process. Awaiting call back for an answer of acceptance from [facility] for GIP. CM will continue to monitor. Gala LewandowskyGraves-Bigelow, Adelise Buswell Kaye, RN,BSN [phone]

## 2013-12-24 NOTE — Care Management Note (Signed)
1125 12/08/2013 [facility] pt for [facility]. Ollen GrossMarjorie will come and get paperwork signed from daughter Marylouise StacksWilma. CM did send text page to MD Rai and the staff RN is aware of plan of care. No further needs from CM at this time. Gala LewandowskyGraves-Bigelow, Diesel Lina Kaye, RN,BSN [phone]

## 2013-12-24 NOTE — H&P (Signed)
[facility] Ad[facility] Note Date: 12/16/2013  Patient name: [name] Medical record number: [id number] Date of birth: 1911-05-05 Age: [age] y.o. Gender: female PCP: Michele McalpineNADEL,SCOTT M, MD  Attending physician:  Anderson MaltaElizabeth Lyndol Vanderheiden, DO   [facility]  History of Present Illness: [name] is a [age] y.o. woman with a past medical history significant for HTN, hypercholesterolemia, chronic congestive heart failure, atrial fibrillation off coumadin due to frequent falls, DJD, [facility] by ambulance as a code stroke after an acute decline in functional status and right sided weakness. She lived at home with a daughter and had been having a progressive decline over the past year. She was not deemed a candidate for stroke intervention and was transitioned to comfort care shortly after admission. PMT consulted for symptom management. Plan was initially for her transport to a Hospice Facility but she has declined rapidly and is too unstable for transport. She is being admitted under GIP Hospice for EOL care.  Meds: Current Facility-Administered Medications  Medication Dose Route Frequency Provider Last Rate Last Dose  . 0.9 %  sodium chloride infusion  250 mL Intravenous PRN Edsel PetrinElizabeth L Rosalie Gelpi, DO      . albuterol (PROVENTIL) (2.5 MG/3ML) 0.083% nebulizer solution 2.5 mg  2.5 mg Nebulization Q2H PRN Edsel PetrinElizabeth L Majorie Santee, DO      . atropine 1 % ophthalmic solution 4 drop  4 drop Sublingual Q4H PRN Edsel PetrinElizabeth L Wilkins Elpers, DO      . LORazepam (ATIVAN) injection 0.5 mg  0.5 mg Intravenous Q4H PRN Edsel PetrinElizabeth L Derick Seminara, DO      . morphine 2 MG/ML injection 1-2 mg  1-2 mg Intravenous Q1H PRN Edsel PetrinElizabeth L Orvilla Truett, DO      . polyvinyl alcohol (LIQUIFILM TEARS) 1.4 % ophthalmic solution 1 drop  1 drop Both Eyes QID PRN Edsel PetrinElizabeth L Mohan Erven, DO      . scopolamine (TRANSDERM-SCOP) 1.5 MG 1.5 mg  1 patch Transdermal Q72H Edsel PetrinElizabeth L Porter Moes, DO    1.5 mg at 12/22/2013 2115  . sodium chloride 0.9 % injection 3 mL  3 mL Intravenous Q12H Edsel PetrinElizabeth L Aadit Hagood, DO      . sodium chloride 0.9 % injection 3 mL  3 mL Intravenous PRN Edsel PetrinElizabeth L Kennah Hehr, DO        Allergies: Allergies as of 12/07/2013 - Review Complete 12/22/2013  Allergen Reaction Noted  . Celecoxib    . Diclofenac sodium     Past Medical History  Diagnosis Date  . Macular degeneration (senile) of retina, unspecified   . Pulmonary nodule   . Unspecified essential hypertension   . Congestive heart failure, unspecified   . Atrial fibrillation   . Unspecified venous (peripheral) insufficiency   . Pure hypercholesterolemia   . GERD (gastroesophageal reflux disease)   . Personal history of unspecified urinary disorder   . DJD (degenerative joint disease)   . Unspecified vitamin D deficiency   . Abnormality of gait   . Anxiety state, unspecified   . Type II or unspecified type diabetes mellitus without mention of complication, not stated as uncontrolled    Past Surgical History  Procedure Laterality Date  . Cholecystectomy  6/07    Dr. Corliss Skainssuei  . Total abdominal hysterectomy    . Bladder surgery    . Total knee arthroplasty  1990    right - Dr. Darrelyn HillockGioffre   Family History  Problem Relation Age of Onset  . Rheum  arthritis Father   . Heart attack Father   . Breast cancer Sister   . Liver cancer Mother   . Diabetes Mother   . Heart disease Sister   . Heart disease Sister    History   Social History  . Marital Status: Widowed    Spouse Name: N/A    Number of Children: N/A  . Years of Education: N/A   Occupational History  . Not on file.   Social History Main Topics  . Smoking status: Never Smoker   . Smokeless tobacco: Never Used  . Alcohol Use: No  . Drug Use: No  . Sexual Activity: No   Other Topics Concern  . Not on file   Social History Narrative   Lives at home with daughter, still ambulatory with walker. Daughter [phone]      Review of Systems: Review of systems not obtained due to patient factors.  Physical Exam: Height 5' 2.99" (1.6 m), weight 81.5 kg (179 lb 10.8 oz). Pale, opens eye to gentle stimulation, her respirations are very shallow and irregular, I counted her rpm>25- she is drawing up her left hand to her face and grimacing-she appears to be having more distress than previous reported earlier today. Extremities with edema and ecchymoses. She has a EJ IV line in her right neck. Supplemental O2 at 2L. She is lying on her left side.  Lab results: Basic Metabolic Panel:  Recent Labs  16/10/96 0150  NA 140  K 4.7  CL 95*  CO2 27  GLUCOSE 233*  BUN 35*  CREATININE 1.52*  CALCIUM 9.3   Cardiac Enzymes:  Recent Labs  12/22/13 0150  TROPONINI <0.30   BNP:  Recent Labs  12/22/13 0150  PROBNP 2641.0*  CBG:  Recent Labs  12/22/13 1153 12/22/13 1652 12/22/13 2331 12/23/13 0517 12/23/13 0758 12/23/13 1133  GLUCAP 180* 186* 198* 204* 240* 202*   Hemoglobin A1C:  Recent Labs  12/22/13 0550  HGBA1C 7.1*   Fasting Lipid Panel:  Recent Labs  12/23/13 0050  CHOL 150  HDL 67  LDLCALC 53  TRIG 152*  CHOLHDL 2.2  Coagulation:  Recent Labs  12/22/13 0150  LABPROT 12.2  INR 0.92  Urinalysis:  Recent Labs  12/22/13 0328  COLORURINE YELLOW  LABSPEC 1.013  PHURINE 6.0  GLUCOSEU NEGATIVE  HGBUR NEGATIVE  BILIRUBINUR NEGATIVE  KETONESUR NEGATIVE  PROTEINUR NEGATIVE  UROBILINOGEN 0.2  NITRITE NEGATIVE  LEUKOCYTESUR NEGATIVE   Assessment & Plan: 1. Acute CVA, dense right hemiparesis 2. Terminal Dyspahgia 3. Respirtory Failure secondary to #1  [name] is actively dying from complications related to a large left MCA stroke. Goals are comfort as she approaches end of life. She is becoming more dyspneic and congested this evening-she is grimacing. She has received very little PRN morphine. She is surrounded by a very loving and devoted family at  bedside.   Spoke with nurse and had her give a morphine bolus this evening  Spoke with family and we will remove her nasal cannula O2 after the bolus and start a low dose morphine infusion for dyspnea and discomfort.   I provided emotional support to family and prognosiic information.  Hospice team following.  Dispo: Anticipate Hospital death- hours.  SignedAnderson Malta 12/13/2013, 11:54 PM

## 2013-12-24 NOTE — Progress Notes (Signed)
The Chaplain offered emotional and spiritual support to the patient's family. The patient's family requested that the Chaplain come by and encourage them because their family member suffered from a terrible stroke this past weekend and their loved one will probably spend out the rest of her days in the hospital. The family member of the patient informed the Chaplain that her love one can hear what they are telling her but she can not respond in the form of conversation because of her illness. The family members of the patient also informed the Chaplain that the doctors will eventually put her on hospice but the family members declined that offer and they would rather see her last days being spent in a hospital where all her family members can come and visit her before her last final moments of life.  Chaplain Bryson HaKwan Rhyse Loux

## 2013-12-24 NOTE — Progress Notes (Signed)
I checked in with Ms. Roggenkamp and her family. They are awaiting to hear if she is accepted as GIP under hospice. She is full comfort care. She appears comfortable but is actively dying. Her terminal secretions are improved with interventions and pain is controlled with morphine. Family is receptive to offer of spiritual care as their church is between pastors. They are pleased and appreciative of the care we have provided.  Yong ChannelAlicia Brooklynn Brandenburg, NP Palliative Medicine Team Team Phone # [phone]

## 2013-12-24 NOTE — Progress Notes (Signed)
Chaplain responded to spiritual care consult and page for family support. Chaplain presented to pt's family in 3W hallway, was introduced to pt's daughter. She said her mother had "suffered a stroke is unresponsive, on full comfort care." She asked chaplain to come back when her sister arrives. Explained that chaplains can be paged at anytime through pt's RNs.  Please page when family requests chaplain.   Maurene CapesHillary D Irusta [phone] After Hours: [phone]

## 2013-12-27 ENCOUNTER — Telehealth: Payer: Self-pay | Admitting: Pulmonary Disease

## 2013-12-27 NOTE — Telephone Encounter (Signed)
Will forward to SN as an FYI 

## 2014-01-05 NOTE — Discharge Summary (Signed)
Death Summary  [name] ZOX:[id number] DOB: 11-22-1911 DOA: 12/12/2013  PCP: Michele McalpineNADEL,SCOTT M, MD PCP/Office notified: Will be notified  Admit date: 12/10/2013 Date of Death: 2014-05-25  Final Diagnoses:  Principal Problem:   Acute CVA (cerebrovascular accident) Active Problems:   Hospice care patient    History of present illness: Patient is a 80 year old female admitted to general inpatient hospice services on 12/18/2013 after suffering a cardioembolic stroke. The patient was attempting to go to the bathroom but she could not sit up in the family was unable to assist her when she suddenly became a phasic. The patient was noted to have facial droop she was determined to not be a candidate for TPA on arrival to the emergency room.  Hospital Course:  Family elected full comfort care and on 02015-06-21 at 8:45 AM the patient was found to be without respirations or cardiac activity.    Time: 0845 am Less than 30 minute discharge Signed:  Derenda MisAYLOR, Candance Bohlman  [facility] 2014-05-25, 10:58 AM

## 2014-01-05 NOTE — Consult Note (Signed)
I have reviewed and discussed the care of this patient in detail with the nurse practitioner including pertinent patient records, physical exam findings and data. I agree with details of this encounter.  

## 2014-01-05 NOTE — Progress Notes (Signed)
Nutrition Brief Note  Pt identified as at nutrition risk on the Malnutrition Screen Tool/Low [place]  Chart reviewed. Pt admitted for EOL care.  No nutrition interventions warranted at this time.  Please consult as needed.   Kendell BaneHeather Sharrieff Spratlin RD, LDN, CNSC [phone] Pager [phone] After Hours Pager

## 2014-01-05 DEATH — deceased

## 2014-01-10 ENCOUNTER — Ambulatory Visit: Payer: Medicare Other | Admitting: Pulmonary Disease

## 2014-01-28 ENCOUNTER — Ambulatory Visit: Payer: Medicare Other | Admitting: Pulmonary Disease

## 2014-12-12 IMAGING — CR DG CHEST 2V
2 series · 2 of 2 positions shown · non-contrast
Comparison: 01/24/2012

CLINICAL DATA: Hypertension

CHEST - 2 VIEW

[view not recorded (1 of 2)]
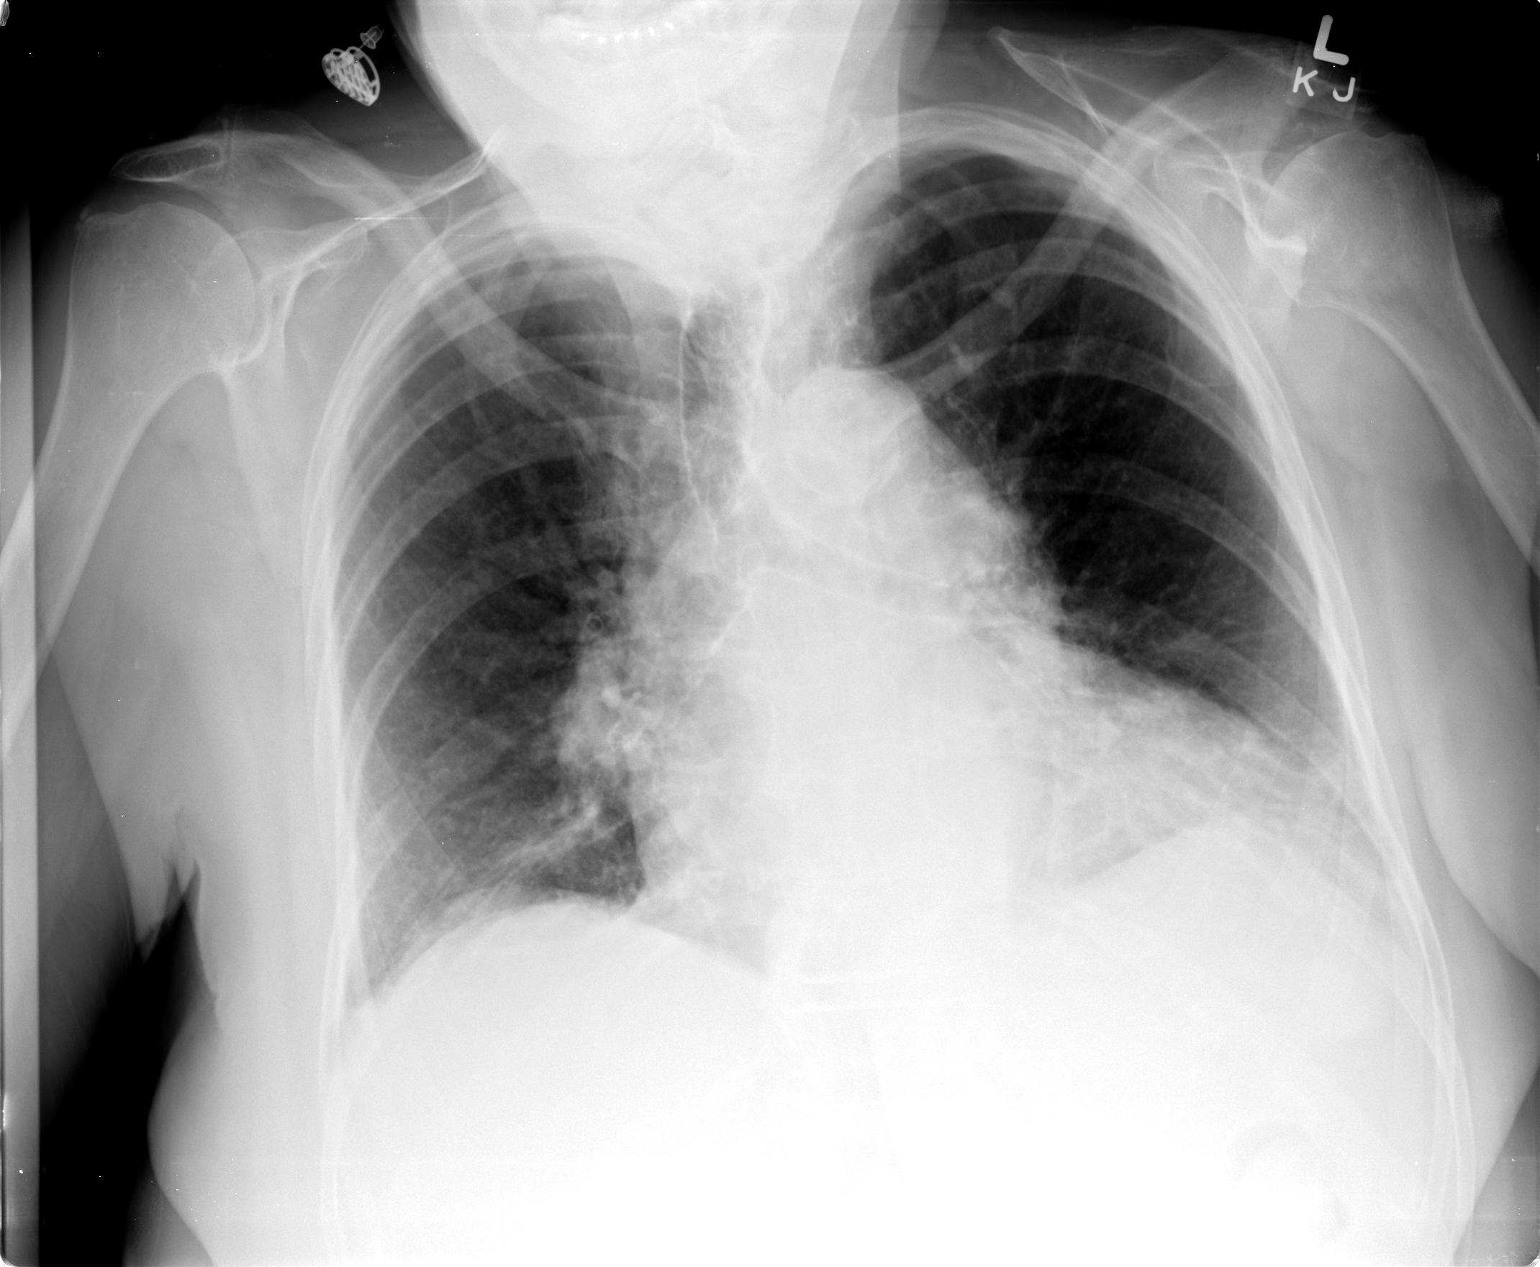

[view not recorded (2 of 2)]
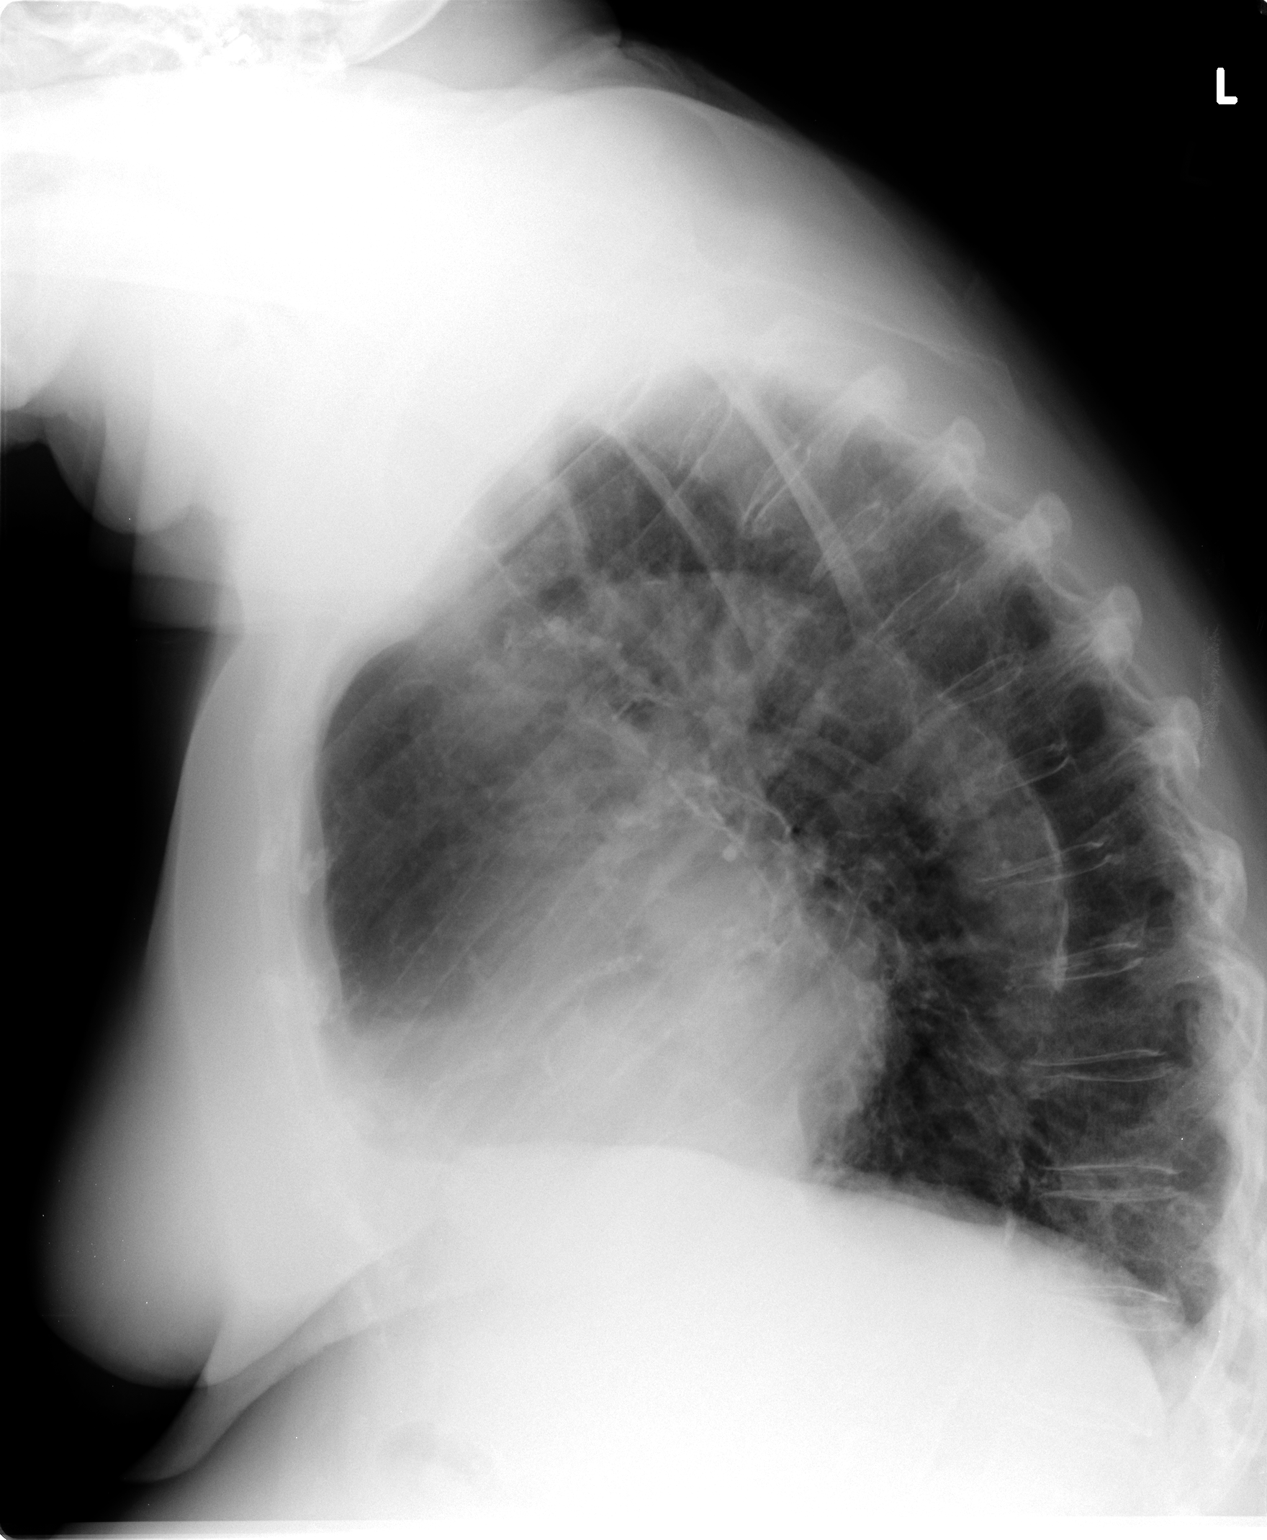

[2 of 2 positions shown; findings below may reference images not displayed]

FINDINGS: Cardiomediastinal silhouette is stable.  No acute
infiltrate or pleural effusion.  No pulmonary edema.  Diffuse
osteopenia thoracic spine.
IMPRESSION: No active disease.  Diffuse osteopenia.
